# Patient Record
Sex: Female | Born: 1965
Health system: Southern US, Community
[De-identification: ages and names within clinical notes are randomized; demographics above are authoritative.]

## PROBLEM LIST (undated history)

## (undated) DIAGNOSIS — M1990 Unspecified osteoarthritis, unspecified site: Secondary | ICD-10-CM

## (undated) DIAGNOSIS — R51 Headache: Secondary | ICD-10-CM

## (undated) DIAGNOSIS — R200 Anesthesia of skin: Secondary | ICD-10-CM

## (undated) DIAGNOSIS — D099 Carcinoma in situ, unspecified: Secondary | ICD-10-CM

## (undated) DIAGNOSIS — N950 Postmenopausal bleeding: Secondary | ICD-10-CM

## (undated) DIAGNOSIS — A159 Respiratory tuberculosis unspecified: Secondary | ICD-10-CM

## (undated) DIAGNOSIS — R519 Headache, unspecified: Secondary | ICD-10-CM

## (undated) DIAGNOSIS — J302 Other seasonal allergic rhinitis: Secondary | ICD-10-CM

## (undated) DIAGNOSIS — C539 Malignant neoplasm of cervix uteri, unspecified: Secondary | ICD-10-CM

## (undated) DIAGNOSIS — Z923 Personal history of irradiation: Secondary | ICD-10-CM

## (undated) DIAGNOSIS — T7840XA Allergy, unspecified, initial encounter: Secondary | ICD-10-CM

## (undated) DIAGNOSIS — Z9221 Personal history of antineoplastic chemotherapy: Secondary | ICD-10-CM

## (undated) HISTORY — DX: Personal history of irradiation: Z92.3

## (undated) HISTORY — DX: Headache, unspecified: R51.9

## (undated) HISTORY — DX: Unspecified osteoarthritis, unspecified site: M19.90

## (undated) HISTORY — DX: Allergy, unspecified, initial encounter: T78.40XA

## (undated) HISTORY — DX: Personal history of antineoplastic chemotherapy: Z92.21

## (undated) HISTORY — DX: Headache: R51

## (undated) HISTORY — PX: OTHER SURGICAL HISTORY: SHX169

---

## 2013-08-20 ENCOUNTER — Encounter: Payer: Self-pay | Admitting: Nurse Practitioner

## 2013-08-20 ENCOUNTER — Ambulatory Visit (INDEPENDENT_AMBULATORY_CARE_PROVIDER_SITE_OTHER): Payer: PRIVATE HEALTH INSURANCE | Admitting: Nurse Practitioner

## 2013-08-20 VITALS — BP 119/67 | HR 88 | Temp 98.5°F | Resp 18 | Ht 62.75 in | Wt 136.0 lb

## 2013-08-20 DIAGNOSIS — Z111 Encounter for screening for respiratory tuberculosis: Secondary | ICD-10-CM

## 2013-08-20 NOTE — Progress Notes (Signed)
Pre visit review using our clinic review tool, if applicable. No additional management support is needed unless otherwise documented below in the visit note. 

## 2013-08-20 NOTE — Patient Instructions (Addendum)
Please return to have TB test read on Monday, no later than 3 pm. You may inquire of your school as to a vaccination waiver. If they will not waive the requirement, you will need 1 dose to TDAP, 2 doses varicella, and 2 doses MMR. If your insurance will not cover vaccines, they are cheapest at [facility].  Your insurance does not cover labs when you are not sick, so I have not ordered labs. Your menstrual cycles may be getting longer due to approaching menopause. Nice to meet you!  Preventive Care for Adults, Female A healthy lifestyle and preventive care can promote health and wellness. Preventive health guidelines for women include the following key practices.  A routine yearly physical is a good way to check with your health care provider about your health and preventive screening. It is a chance to share any concerns and updates on your health and to receive a thorough exam.  Visit your dentist for a routine exam and preventive care every 6 months. Brush your teeth twice a day and floss once a day. Good oral hygiene prevents tooth decay and gum disease.  The frequency of eye exams is based on your age, health, family medical history, use of contact lenses, and other factors. Follow your health care provider's recommendations for frequency of eye exams.  Eat a healthy diet. Foods like vegetables, fruits, whole grains, low-fat dairy products, and lean protein foods contain the nutrients you need without too many calories. Decrease your intake of foods high in solid fats, added sugars, and salt. Eat the right amount of calories for you.Get information about a proper diet from your health care provider, if necessary.  Regular physical exercise is one of the most important things you can do for your health. Most adults should get at least 150 minutes of moderate-intensity exercise (any activity that increases your heart rate and causes you to sweat) each week. In addition,  most adults need muscle-strengthening exercises on 2 or more days a week.  Maintain a healthy weight. The body mass index (BMI) is a screening tool to identify possible weight problems. It provides an estimate of body fat based on height and weight. Your health care provider can find your BMI, and can help you achieve or maintain a healthy weight.For adults 20 years and older:  A BMI below 18.5 is considered underweight.  A BMI of 18.5 to 24.9 is normal.  A BMI of 25 to 29.9 is considered overweight.  A BMI of 30 and above is considered obese.  Maintain normal blood lipids and cholesterol levels by exercising and minimizing your intake of saturated fat. Eat a balanced diet with plenty of fruit and vegetables. Blood tests for lipids and cholesterol should begin at age 39 and be repeated every 5 years. If your lipid or cholesterol levels are high, you are over 50, or you are at high risk for heart disease, you may need your cholesterol levels checked more frequently.Ongoing high lipid and cholesterol levels should be treated with medicines if diet and exercise are not working.  If you smoke, find out from your health care provider how to quit. If you do not use tobacco, do not start.  Lung cancer screening is recommended for adults aged 36 80 years who are at high risk for developing lung cancer because of a history of smoking. A yearly low-dose CT scan of the lungs is recommended for people who have at least a 30-pack-year history of smoking and  are a current smoker or have quit within the past 15 years. A pack year of smoking is smoking an average of 1 pack of cigarettes a day for 1 year (for example: 1 pack a day for 30 years or 2 packs a day for 15 years). Yearly screening should continue until the smoker has stopped smoking for at least 15 years. Yearly screening should be stopped for people who develop a health problem that would prevent them from having lung cancer treatment.  If you are  pregnant, do not drink alcohol. If you are breastfeeding, be very cautious about drinking alcohol. If you are not pregnant and choose to drink alcohol, do not have more than 1 drink per day. One drink is considered to be 12 ounces (355 mL) of beer, 5 ounces (148 mL) of wine, or 1.5 ounces (44 mL) of liquor.  Avoid use of street drugs. Do not share needles with anyone. Ask for help if you need support or instructions about stopping the use of drugs.  High blood pressure causes heart disease and increases the risk of stroke. Your blood pressure should be checked at least every 1 to 2 years. Ongoing high blood pressure should be treated with medicines if weight loss and exercise do not work.  If you are 10 47 years old, ask your health care provider if you should take aspirin to prevent strokes.  Diabetes screening involves taking a blood sample to check your fasting blood sugar level. This should be done once every 3 years, after age 49, if you are within normal weight and without risk factors for diabetes. Testing should be considered at a younger age or be carried out more frequently if you are overweight and have at least 1 risk factor for diabetes.  Breast cancer screening is essential preventive care for women. You should practice "breast self-awareness." This means understanding the normal appearance and feel of your breasts and may include breast self-examination. Any changes detected, no matter how small, should be reported to a health care provider. Women in their 110s and 30s should have a clinical breast exam (CBE) by a health care provider as part of a regular health exam every 1 to 3 years. After age 55, women should have a CBE every year. Starting at age 27, women should consider having a mammogram (breast X-ray test) every year. Women who have a family history of breast cancer should talk to their health care provider about genetic screening. Women at a high risk of breast cancer should talk to  their health care providers about having an MRI and a mammogram every year.  Breast cancer gene (BRCA)-related cancer risk assessment is recommended for women who have family members with BRCA-related cancers. BRCA-related cancers include breast, ovarian, tubal, and peritoneal cancers. Having family members with these cancers may be associated with an increased risk for harmful changes (mutations) in the breast cancer genes BRCA1 and BRCA2. Results of the assessment will determine the need for genetic counseling and BRCA1 and BRCA2 testing.  The Pap test is a screening test for cervical cancer. A Pap test can show cell changes on the cervix that might become cervical cancer if left untreated. A Pap test is a procedure in which cells are obtained and examined from the lower end of the uterus (cervix).  Women should have a Pap test starting at age 21.  Between ages 43 and 28, Pap tests should be repeated every 2 years.  Beginning at age 29, you should  have a Pap test every 3 years as long as the past 3 Pap tests have been normal.  Some women have medical problems that increase the chance of getting cervical cancer. Talk to your health care provider about these problems. It is especially important to talk to your health care provider if a new problem develops soon after your last Pap test. In these cases, your health care provider may recommend more frequent screening and Pap tests.  The above recommendations are the same for women who have or have not gotten the vaccine for human papillomavirus (HPV).  If you had a hysterectomy for a problem that was not cancer or a condition that could lead to cancer, then you no longer need Pap tests. Even if you no longer need a Pap test, a regular exam is a good idea to make sure no other problems are starting.  If you are between ages 63 and 24 years, and you have had normal Pap tests going back 10 years, you no longer need Pap tests. Even if you no longer need  a Pap test, a regular exam is a good idea to make sure no other problems are starting.  If you have had past treatment for cervical cancer or a condition that could lead to cancer, you need Pap tests and screening for cancer for at least 20 years after your treatment.  If Pap tests have been discontinued, risk factors (such as a new sexual partner) need to be reassessed to determine if screening should be resumed.  The HPV test is an additional test that may be used for cervical cancer screening. The HPV test looks for the virus that can cause the cell changes on the cervix. The cells collected during the Pap test can be tested for HPV. The HPV test could be used to screen women aged 26 years and older, and should be used in women of any age who have unclear Pap test results. After the age of 68, women should have HPV testing at the same frequency as a Pap test.  Colorectal cancer can be detected and often prevented. Most routine colorectal cancer screening begins at the age of 30 years and continues through age 95 years. However, your health care provider may recommend screening at an earlier age if you have risk factors for colon cancer. On a yearly basis, your health care provider may provide home test kits to check for hidden blood in the stool. Use of a small camera at the end of a tube, to directly examine the colon (sigmoidoscopy or colonoscopy), can detect the earliest forms of colorectal cancer. Talk to your health care provider about this at age 50, when routine screening begins. Direct exam of the colon should be repeated every 5 10 years through age 6 years, unless early forms of pre-cancerous polyps or small growths are found.  People who are at an increased risk for hepatitis B should be screened for this virus. You are considered at high risk for hepatitis B if:  You were born in a country where hepatitis B occurs often. Talk with your health care provider about which countries are  considered high risk.  Your parents were born in a high-risk country and you have not received a shot to protect against hepatitis B (hepatitis B vaccine).  You have HIV or AIDS.  You use needles to inject street drugs.  You live with, or have sex with, someone who has Hepatitis B.  You get hemodialysis treatment.  You take certain medicines for conditions like cancer, organ transplantation, and autoimmune conditions.  Hepatitis C blood testing is recommended for all people born from [birth year] through 1965 and any individual with known risks for hepatitis C.  Practice safe sex. Use condoms and avoid high-risk sexual practices to reduce the spread of sexually transmitted infections (STIs). STIs include gonorrhea, chlamydia, syphilis, trichomonas, herpes, HPV, and human immunodeficiency virus (HIV). Herpes, HIV, and HPV are viral illnesses that have no cure. They can result in disability, cancer, and death. Sexually active women aged 21 years and younger should be checked for chlamydia. Older women with new or multiple partners should also be tested for chlamydia. Testing for other STIs is recommended if you are sexually active and at increased risk.  Osteoporosis is a disease in which the bones lose minerals and strength with aging. This can result in serious bone fractures or breaks. The risk of osteoporosis can be identified using a bone density scan. Women ages 70 years and over and women at risk for fractures or osteoporosis should discuss screening with their health care providers. Ask your health care provider whether you should take a calcium supplement or vitamin D to reduce the rate of osteoporosis.  Menopause can be associated with physical symptoms and risks. Hormone replacement therapy is available to decrease symptoms and risks. You should talk to your health care provider about whether hormone replacement therapy is right for you.  Use sunscreen. Apply sunscreen liberally and repeatedly  throughout the day. You should seek shade when your shadow is shorter than you. Protect yourself by wearing long sleeves, pants, a wide-brimmed hat, and sunglasses year round, whenever you are outdoors.  Once a month, do a whole body skin exam, using a mirror to look at the skin on your back. Tell your health care provider of new moles, moles that have irregular borders, moles that are larger than a pencil eraser, or moles that have changed in shape or color.  Stay current with required vaccines (immunizations).  Influenza vaccine. All adults should be immunized every year.  Tetanus, diphtheria, and acellular pertussis (Td, Tdap) vaccine. Pregnant women should receive 1 dose of Tdap vaccine during each pregnancy. The dose should be obtained regardless of the length of time since the last dose. Immunization is preferred during the 27th 36th week of gestation. An adult who has not previously received Tdap or who does not know her vaccine status should receive 1 dose of Tdap. This initial dose should be followed by tetanus and diphtheria toxoids (Td) booster doses every 10 years. Adults with an unknown or incomplete history of completing a 3-dose immunization series with Td-containing vaccines should begin or complete a primary immunization series including a Tdap dose. Adults should receive a Td booster every 10 years.  Varicella vaccine. An adult without evidence of immunity to varicella should receive 2 doses or a second dose if she has previously received 1 dose. Pregnant females who do not have evidence of immunity should receive the first dose after pregnancy. This first dose should be obtained before leaving the health care facility. The second dose should be obtained 4 8 weeks after the first dose.  Human papillomavirus (HPV) vaccine. Females aged 79 26 years who have not received the vaccine previously should obtain the 3-dose series. The vaccine is not recommended for use in pregnant females.  However, pregnancy testing is not needed before receiving a dose. If a female is found to be pregnant after receiving a dose, no  treatment is needed. In that case, the remaining doses should be delayed until after the pregnancy. Immunization is recommended for any person with an immunocompromised condition through the age of 85 years if she did not get any or all doses earlier. During the 3-dose series, the second dose should be obtained 4 8 weeks after the first dose. The third dose should be obtained 24 weeks after the first dose and 16 weeks after the second dose.  Zoster vaccine. One dose is recommended for adults aged 54 years or older unless certain conditions are present.  Measles, mumps, and rubella (MMR) vaccine. Adults born before [birth year] generally are considered immune to measles and mumps. Adults born in [birth year] or later should have 1 or more doses of MMR vaccine unless there is a contraindication to the vaccine or there is laboratory evidence of immunity to each of the three diseases. A routine second dose of MMR vaccine should be obtained at least 28 days after the first dose for students attending postsecondary schools, health care workers, or international travelers. People who received inactivated measles vaccine or an unknown type of measles vaccine during 1963 1967 should receive 2 doses of MMR vaccine. People who received inactivated mumps vaccine or an unknown type of mumps vaccine before 1979 and are at high risk for mumps infection should consider immunization with 2 doses of MMR vaccine. For females of childbearing age, rubella immunity should be determined. If there is no evidence of immunity, females who are not pregnant should be vaccinated. If there is no evidence of immunity, females who are pregnant should delay immunization until after pregnancy. Unvaccinated health care workers born before [birth year] who lack laboratory evidence of measles, mumps, or rubella immunity or laboratory  confirmation of disease should consider measles and mumps immunization with 2 doses of MMR vaccine or rubella immunization with 1 dose of MMR vaccine.  Pneumococcal 13-valent conjugate (PCV13) vaccine. When indicated, a person who is uncertain of her immunization history and has no record of immunization should receive the PCV13 vaccine. An adult aged 33 years or older who has certain medical conditions and has not been previously immunized should receive 1 dose of PCV13 vaccine. This PCV13 should be followed with a dose of pneumococcal polysaccharide (PPSV23) vaccine. The PPSV23 vaccine dose should be obtained at least 8 weeks after the dose of PCV13 vaccine. An adult aged 67 years or older who has certain medical conditions and previously received 1 or more doses of PPSV23 vaccine should receive 1 dose of PCV13. The PCV13 vaccine dose should be obtained 1 or more years after the last PPSV23 vaccine dose.  Pneumococcal polysaccharide (PPSV23) vaccine. When PCV13 is also indicated, PCV13 should be obtained first. All adults aged 65 years and older should be immunized. An adult younger than age 45 years who has certain medical conditions should be immunized. Any person who resides in a nursing home or long-term care facility should be immunized. An adult smoker should be immunized. People with an immunocompromised condition and certain other conditions should receive both PCV13 and PPSV23 vaccines. People with human immunodeficiency virus (HIV) infection should be immunized as soon as possible after diagnosis. Immunization during chemotherapy or radiation therapy should be avoided. Routine use of PPSV23 vaccine is not recommended for American Indians, [place] Natives, or people younger than 65 years unless there are medical conditions that require PPSV23 vaccine. When indicated, people who have unknown immunization and have no record of immunization should receive PPSV23 vaccine. One-time revaccination 5  years  after the first dose of PPSV23 is recommended for people aged 54 64 years who have chronic kidney failure, nephrotic syndrome, asplenia, or immunocompromised conditions. People who received 1 2 doses of PPSV23 before age 70 years should receive another dose of PPSV23 vaccine at age 32 years or later if at least 5 years have passed since the previous dose. Doses of PPSV23 are not needed for people immunized with PPSV23 at or after age 94 years.  Meningococcal vaccine. Adults with asplenia or persistent complement component deficiencies should receive 2 doses of quadrivalent meningococcal conjugate (MenACWY-D) vaccine. The doses should be obtained at least 2 months apart. Microbiologists working with certain meningococcal bacteria, [place] recruits, people at risk during an outbreak, and people who travel to or live in countries with a high rate of meningitis should be immunized. A first-year college student up through age 55 years who is living in a residence hall should receive a dose if she did not receive a dose on or after her 16th birthday. Adults who have certain high-risk conditions should receive one or more doses of vaccine.  Hepatitis A vaccine. Adults who wish to be protected from this disease, have certain high-risk conditions, work with hepatitis A-infected animals, work in hepatitis A research labs, or travel to or work in countries with a high rate of hepatitis A should be immunized. Adults who were previously unvaccinated and who anticipate close contact with an international adoptee during the first 60 days after arrival in the [place] from a country with a high rate of hepatitis A should be immunized.  Hepatitis B vaccine. Adults who wish to be protected from this disease, have certain high-risk conditions, may be exposed to blood or other infectious body fluids, are household contacts or sex partners of hepatitis B positive people, are clients or workers in certain care facilities, or  travel to or work in countries with a high rate of hepatitis B should be immunized.  Haemophilus influenzae type b (Hib) vaccine. A previously unvaccinated person with asplenia or sickle cell disease or having a scheduled splenectomy should receive 1 dose of Hib vaccine. Regardless of previous immunization, a recipient of a hematopoietic stem cell transplant should receive a 3-dose series 6 12 months after her successful transplant. Hib vaccine is not recommended for adults with HIV infection. Preventive Services / Frequency Ages 77 to 64years  Blood pressure check.** / Every 1 to 2 years.  Lipid and cholesterol check.** / Every 5 years beginning at age 82 years.  Lung cancer screening. / Every year if you are aged 4 80 years and have a 30-pack-year history of smoking and currently smoke or have quit within the past 15 years. Yearly screening is stopped once you have quit smoking for at least 15 years or develop a health problem that would prevent you from having lung cancer treatment.  Clinical breast exam.** / Every year after age 13 years.  BRCA-related cancer risk assessment.** / For women who have family members with a BRCA-related cancer (breast, ovarian, tubal, or peritoneal cancers).  Mammogram.** / Every year beginning at age 43 years and continuing for as long as you are in good health. Consult with your health care provider.  Pap test.** / Every 3 years starting at age 39 years through age 61 or 88 years with a history of 3 consecutive normal Pap tests.  HPV screening.** / Every 3 years from ages 81 years through ages 34 to 12 years with a history of  3 consecutive normal Pap tests.  Fecal occult blood test (FOBT) of stool. / Every year beginning at age 49 years and continuing until age 70 years. You may not need to do this test if you get a colonoscopy every 10 years.  Flexible sigmoidoscopy or colonoscopy.** / Every 5 years for a flexible sigmoidoscopy or every 10 years for a  colonoscopy beginning at age 78 years and continuing until age 37 years.  Hepatitis C blood test.** / For all people born from [birth year] through 1965 and any individual with known risks for hepatitis C.  Skin self-exam. / Monthly.  Influenza vaccine. / Every year.  Tetanus, diphtheria, and acellular pertussis (Tdap/Td) vaccine.** / Consult your health care provider. Pregnant women should receive 1 dose of Tdap vaccine during each pregnancy. 1 dose of Td every 10 years.  Varicella vaccine.** / Consult your health care provider. Pregnant females who do not have evidence of immunity should receive the first dose after pregnancy.  Zoster vaccine.** / 1 dose for adults aged 17 years or older.  Measles, mumps, rubella (MMR) vaccine.** / You need at least 1 dose of MMR if you were born in 1957 or later. You may also need a 2nd dose. For females of childbearing age, rubella immunity should be determined. If there is no evidence of immunity, females who are not pregnant should be vaccinated. If there is no evidence of immunity, females who are pregnant should delay immunization until after pregnancy.  Pneumococcal 13-valent conjugate (PCV13) vaccine.** / Consult your health care provider.  Pneumococcal polysaccharide (PPSV23) vaccine.** / 1 to 2 doses if you smoke cigarettes or if you have certain conditions.  Meningococcal vaccine.** / Consult your health care provider.  Hepatitis A vaccine.** / Consult your health care provider.  Hepatitis B vaccine.** / Consult your health care provider.  Haemophilus influenzae type b (Hib) vaccine.** / Consult your health care provider. ** Family history and personal history of risk and conditions may change your health care provider's recommendations. Document Released: 06/25/2001 Document Revised: 02/17/2013 Document Reviewed: 09/24/2010 [facility] Patient Information 2014 [place], [place].  Perimenopause Perimenopause is the time when your body begins to move  into the menopause (no menstrual period for 12 straight months). It is a natural process. Perimenopause can begin 2 8 years before the menopause and usually lasts for 1 year after the menopause. During this time, your ovaries may or may not produce an egg. The ovaries vary in their production of estrogen and progesterone hormones each month. This can cause irregular menstrual periods, difficulty getting pregnant, vaginal bleeding between periods, and uncomfortable symptoms. CAUSES  Irregular production of the ovarian hormones, estrogen and progesterone, and not ovulating every month.  Other causes include:  Tumor of the pituitary gland in the brain.  Medical disease that affects the ovaries.  Radiation treatment.  Chemotherapy.  Unknown causes.  Heavy smoking and excessive alcohol intake can bring on perimenopause sooner. SIGNS AND SYMPTOMS   Hot flashes.  Night sweats.  Irregular menstrual periods.  Decreased sex drive.  Vaginal dryness.  Headaches.  Mood swings.  Depression.  Memory problems.  Irritability.  Tiredness.  Weight gain.  Trouble getting pregnant.  The beginning of losing bone cells (osteoporosis).  The beginning of hardening of the arteries (atherosclerosis). DIAGNOSIS  Your health care provider will make a diagnosis by analyzing your age, menstrual history, and symptoms. He or she will do a physical exam and note any changes in your body, especially your female organs. Female hormone tests  may or may not be helpful depending on the amount of female hormones you produce and when you produce them. However, other hormone tests may be helpful to rule out other problems. TREATMENT  In some cases, no treatment is needed. The decision on whether treatment is necessary during the perimenopause should be made by you and your health care provider based on how the symptoms are affecting you and your lifestyle. Various treatments are available, such  as:  Treating individual symptoms with a specific medicine for that symptom.  Herbal medicines that can help specific symptoms.  Counseling.  Group therapy. HOME CARE INSTRUCTIONS   Keep track of your menstrual periods (when they occur, how heavy they are, how long between periods, and how long they last) as well as your symptoms and when they started.  Only take over-the-counter or prescription medicines as directed by your health care provider.  Sleep and rest.  Exercise.  Eat a diet that contains calcium (good for your bones) and soy (acts like the estrogen hormone).  Do not smoke.  Avoid alcoholic beverages.  Take vitamin supplements as recommended by your health care provider. Taking vitamin E may help in certain cases.  Take calcium and vitamin D supplements to help prevent bone loss.  Group therapy is sometimes helpful.  Acupuncture may help in some cases. SEEK MEDICAL CARE IF:   You have questions about any symptoms you are having.  You need a referral to a specialist (gynecologist, psychiatrist, or psychologist). SEEK IMMEDIATE MEDICAL CARE IF:   You have vaginal bleeding.  Your period lasts longer than 8 days.  Your periods are recurring sooner than 21 days.  You have bleeding after intercourse.  You have severe depression.  You have pain when you urinate.  You have severe headaches.  You have vision problems. Document Released: 06/06/2004 Document Revised: 02/17/2013 Document Reviewed: 11/26/2012 [facility] Patient Information 2014 [place], [place].  Tuberculin Skin Test The PPD skin test is a method used to help with the diagnosis of a disease called tuberculosis (TB). HOW THE TEST IS DONE  The test site (usually the forearm) is cleansed. The PPD extract is then injected under the top layer of skin, causing a blister to form on the skin. The reaction will take 48 - 72 hours to develop. You must return to your health care provider within that time  to have the area checked. This will determine whether you have had a significant reaction to the PPD test. A reaction is measured in millimeters of hard swelling (induration) at the site. PREPARATION FOR TEST  There is no special preparation for this test. People with a skin rash or other skin irritations on their arms may need to have the test performed at a different spot on the body. Tell your health care provider if you have ever had a positive PPD skin test. If so, you should not have a repeat PPD test. Tell your doctor if you have a medical condition or if you take certain drugs, such as steroids, that can affect your immune system. These situations may lead to inaccurate test results. NORMAL FINDINGS A negative reaction (no induration) or a level of hard swelling that falls below a certain cutoff may mean that a person has not been infected with the bacteria that cause TB. There are different cutoffs for children, people with HIV, and other risk groups. Unfortunately, this is not a perfect test, and up to 20% of people infected with tuberculosis may not have a  reaction on the PPD skin test. In addition, certain conditions that affect the immune system (cancer, recent chemotherapy, late-stage AIDS) may cause a false-negative test result.  The reaction will take 48 - 72 hours to develop. You must return to your health care provider within that time to have the area checked. Follow your caregiver's instructions as to where and when to report for this to be done. Ranges for normal findings may vary among different laboratories and hospitals. You should always check with your doctor after having lab work or other tests done to discuss the meaning of your test results and whether your values are considered within normal limits. WHAT ABNORMAL RESULTS MEAN  The results of the test depend on the size of the skin reaction and on the person being tested.  A small reaction (5 mm of hard swelling at the site)  is considered to be positive in people who have HIV, who are taking steroid therapy, or who have been in close contact with a person who has active tuberculosis. Larger reactions (greater than or equal to 10 mm) are considered positive in people with diabetes or kidney failure, and in health care workers, among others. In people with no known risks for tuberculosis, a positive reaction requires 15 mm or more of hard swelling at the site. RISKS AND COMPLICATIONS There is a very small risk of severe redness and swelling of the arm in people who have had a previous positive PPD test and who have the test again. There also have been a few rare cases of this reaction in people who have not been tested before. CONSIDERATIONS  A positive skin test does not necessarily mean that a person has active tuberculosis. More tests will be done to check whether active disease is present. Many people who were born outside the [place] may have had a vaccine called "BCG," which can lead to a false-positive test result. MEANING OF TEST  Your caregiver will go over the test results with you and discuss the importance and meaning of your results, as well as treatment options and the need for additional tests if necessary. OBTAINING THE TEST RESULTS It is your responsibility to obtain your test results. Ask the lab or department performing the test when and how you will get your results. Document Released: 02/06/2005 Document Revised: 07/22/2011 Document Reviewed: 04/10/2008 [facility] Patient Information 2014 [place], [place].

## 2013-08-23 ENCOUNTER — Ambulatory Visit: Payer: PRIVATE HEALTH INSURANCE | Admitting: *Deleted

## 2013-08-23 LAB — TB SKIN TEST
INDURATION: 0.7 mm
TB SKIN TEST: NEGATIVE

## 2013-08-24 ENCOUNTER — Encounter: Payer: Self-pay | Admitting: Nurse Practitioner

## 2013-08-24 NOTE — Progress Notes (Signed)
Subjective:     Holly Pena is a 48 y.o. female and is here for a college physical. The patient reports no problems.  History   Social History  . Marital Status: Married    Spouse Name: N/A    Number of Children: 2  . Years of Education: N/A   Occupational History  . student     [employer]   Social History Main Topics  . Smoking status: Never Smoker   . Smokeless tobacco: Not on file  . Alcohol Use: No  . Drug Use: Not on file  . Sexual Activity: Not on file   Other Topics Concern  . Not on file   Social History Narrative   Holly Pena is From [place]. She has been in [place] for 1 year. She lives with her husband & 2 daughters. She is a Electronics engineer.   There are no preventive care reminders to display for this patient.  The following portions of the patient's history were reviewed and updated as appropriate: allergies, current medications, past family history, past medical history, past social history, past surgical history and problem list.  Review of Systems Constitutional: negative for fatigue, fevers and night sweats Eyes: negative, uses readers Ears, nose, mouth, throat, and face: negative for nasal congestion and sore throat Respiratory: negative for asthma, cough and sputum Cardiovascular: negative for chest pressure/discomfort, fatigue, irregular heart beat and lower extremity edema Gastrointestinal: negative for abdominal pain, constipation and diarrhea Genitourinary:positive for [facility] getting longer, negative for dysuria Integument/breast: negative for rash Musculoskeletal:negative for arthralgias, back pain and myalgias Behavioral/Psych: negative for decreased appetite, depression, excessive alcohol consumption, illegal drug usage, sleep disturbance and tobacco use   Objective:    BP 119/67  Pulse 88  Temp(Src) 98.5 F (36.9 C) (Temporal)  Resp 18  Ht 5' 2.75" (1.594 m)  Wt 136 lb (61.689 kg)  BMI 24.28 kg/m2  SpO2 98%  LMP 07/15/2013 General appearance: alert,  cooperative, appears stated age and no distress Head: Normocephalic, without obvious abnormality, atraumatic Eyes: negative findings: lids and lashes normal and conjunctivae and sclerae normal Ears: normal TM's and external ear canals both ears Throat: lips, mucosa, and tongue normal; teeth and gums normal Lungs: clear to auscultation bilaterally Heart: regular rate and rhythm, S1, S2 normal, no murmur, click, rub or gallop Abdomen: soft, non-tender; bowel sounds normal; no masses,  no organomegaly Extremities: extremities normal, atraumatic, no cyanosis or edema Pulses: 2+ and symmetric Skin: Skin color, texture, turgor normal. No rashes or lesions Lymph nodes: Cervical, supraclavicular, and axillary nodes normal.  Neuro: grossly intact  Assessment:    Healthy female exam.  No screening labs as insurance does not cover preventive services No vaccine record to review-if unable to locate-needs 2 varicella, Tdap, 2 MMR Request PPD skin test today.    Plan:    Administered PPD. F/u in 72 hrs to read PPD. No form filled out, as pt is requesting vaccine waiver from school. She will bring form back at her convenience. See After Visit Summary for Counseling Recommendations

## 2013-09-03 ENCOUNTER — Ambulatory Visit: Payer: PRIVATE HEALTH INSURANCE

## 2013-09-03 ENCOUNTER — Encounter: Payer: Self-pay | Admitting: Nurse Practitioner

## 2013-09-03 VITALS — BP 106/67 | HR 79 | Temp 98.2°F | Resp 18 | Ht 62.75 in | Wt 136.0 lb

## 2013-09-03 DIAGNOSIS — Z23 Encounter for immunization: Secondary | ICD-10-CM

## 2013-09-03 NOTE — Progress Notes (Unsigned)
Pre visit review using our clinic review tool, if applicable. No additional management support is needed unless otherwise documented below in the visit note. 

## 2013-12-13 ENCOUNTER — Encounter: Payer: Self-pay | Admitting: Nurse Practitioner

## 2013-12-13 ENCOUNTER — Other Ambulatory Visit: Payer: Self-pay | Admitting: Nurse Practitioner

## 2013-12-13 ENCOUNTER — Ambulatory Visit (INDEPENDENT_AMBULATORY_CARE_PROVIDER_SITE_OTHER): Payer: PRIVATE HEALTH INSURANCE | Admitting: Nurse Practitioner

## 2013-12-13 VITALS — BP 88/58 | HR 70 | Temp 98.6°F | Resp 18 | Ht 62.75 in | Wt 132.0 lb

## 2013-12-13 DIAGNOSIS — R1900 Intra-abdominal and pelvic swelling, mass and lump, unspecified site: Secondary | ICD-10-CM

## 2013-12-13 LAB — POCT URINE PREGNANCY: Preg Test, Ur: NEGATIVE

## 2013-12-13 NOTE — Progress Notes (Signed)
Pre visit review using our clinic review tool, if applicable. No additional management support is needed unless otherwise documented below in the visit note. 

## 2013-12-13 NOTE — Progress Notes (Signed)
Subjective:     Holly Pena is a 48 y.o. female who presents for evaluation of abdominal pain. Onset was 1 day ago. She noticed tender area RLQ while taking shower last night. She feels something firm in abdomen. Last MC was 4 weeks ago. She is not using birth control. She is sexually active. She feels her abdomen has gotten bigger in last few days. Associated symptoms: heart burn in last few days that has improved since returning from trip & eating "regular" foods again. The patient denies belching, chills, constipation, diarrhea, dysuria, fever, flatus, nausea and vomiting.  The patient's history has been marked as reviewed and updated as appropriate.  Review of Systems Pertinent items are noted in HPI.     Objective:    BP 88/58  Pulse 70  Temp(Src) 98.6 F (37 C) (Temporal)  Resp 18  Ht 5' 2.75" (1.594 m)  Wt 132 lb (59.875 kg)  BMI 23.57 kg/m2  SpO2 98% General appearance: alert, cooperative, appears stated age and no distress Head: Normocephalic, without obvious abnormality, atraumatic Eyes: negative findings: lids and lashes normal and conjunctivae and sclerae normal Abdomen: abnormal findings:  Linear mass felt RLQ/suprapubic area that can be felt across pelvis to mid-line. tender to push. About 1cm X 4cm. O/w nml abd exam. No HSM.   Extremities: extremities normal, atraumatic, no cyanosis or edema    Assessment:   1. Pelvic mass in female DD: uterine fibroid, extrauterine growth - US Pelvis Complete; Future - POCT urine pregnancy-neg.  F/u 10 days. See pt instructions.

## 2013-12-13 NOTE — Patient Instructions (Addendum)
Please get ultrasound of pelvis. Mass may be uterine fibroids, sometimes they are on the outside of the uterus. Fibroids get bigger just before your menstrual cycle.  Please schedule office visit in 10 days to re-evaluate mass. If it is fibroid, it may be smaller. We will also discuss ultrasound results at that time.  If you have pain, take 400 mg ibuprophen with food every 12 hours.

## 2013-12-15 ENCOUNTER — Ambulatory Visit (HOSPITAL_BASED_OUTPATIENT_CLINIC_OR_DEPARTMENT_OTHER)
Admission: RE | Admit: 2013-12-15 | Discharge: 2013-12-15 | Disposition: A | Discharge status: Home / Self Care | Payer: No Typology Code available for payment source | Source: Ambulatory Visit | Attending: Nurse Practitioner | Admitting: Nurse Practitioner

## 2013-12-15 DIAGNOSIS — R1900 Intra-abdominal and pelvic swelling, mass and lump, unspecified site: Secondary | ICD-10-CM | POA: Diagnosis present

## 2013-12-22 ENCOUNTER — Telehealth: Payer: Self-pay | Admitting: Nurse Practitioner

## 2013-12-22 NOTE — Telephone Encounter (Signed)
Discussed [place] result w/pt. Pain resolved within day or so of appt. She had c-sections w/children.-possible scar tissue palpated. F/u PRN

## 2016-06-26 ENCOUNTER — Ambulatory Visit (INDEPENDENT_AMBULATORY_CARE_PROVIDER_SITE_OTHER): Payer: 59 | Admitting: Family Medicine

## 2016-06-26 ENCOUNTER — Encounter: Payer: Self-pay | Admitting: Family Medicine

## 2016-06-26 VITALS — BP 120/79 | HR 50 | Temp 97.7°F | Resp 18 | Ht 63.0 in | Wt 137.0 lb

## 2016-06-26 DIAGNOSIS — R519 Headache, unspecified: Secondary | ICD-10-CM | POA: Insufficient documentation

## 2016-06-26 DIAGNOSIS — R51 Headache: Secondary | ICD-10-CM

## 2016-06-26 DIAGNOSIS — J0100 Acute maxillary sinusitis, unspecified: Secondary | ICD-10-CM | POA: Diagnosis not present

## 2016-06-26 MED ORDER — DOXYCYCLINE HYCLATE 100 MG PO TABS: 100.0000 mg | ORAL_TABLET | Freq: Two times a day (BID) | ORAL | 0 refills | Status: DC

## 2016-06-26 NOTE — Progress Notes (Signed)
    Holly Pena , [birth year], 51 y.o., female MRN: [id number] Patient Care Team    Relationship Specialty Notifications Start End  [place], DO PCP - General Family Medicine  06/26/16    Pt is present with interpreter today CC: headaches Subjective: Pt presents for an OV with complaints of headache of 1 week  duration. Pt has had a h/o frequent headaches by PMH, she feels this headache is different. She states last week she experienced a headache around her eyes, with ear pressure, mild sore throat and teeth pain. She states her husband was ill last week, but had different URI sx. She denies fever, chills, nausea, vomit, diarrhea, rhinorrhea.   No Known Allergies Social History  Substance Use Topics  . Smoking status: Never Smoker  . Smokeless tobacco: Never Used  . Alcohol use No   Past Medical History:  Diagnosis Date  . Frequent headaches    Past Surgical History:  Procedure Laterality Date  . CESAREAN SECTION     History reviewed. No pertinent family history. Allergies as of 06/26/2016   No Known Allergies     Medication List    as of 06/26/2016 10:22 AM   You have not been prescribed any medications.     No results found for this or any previous visit (from the past 24 hour(s)). No results found.   ROS: Negative, with the exception of above mentioned in HPI   Objective:  BP 120/79 (BP Location: Left Arm, Patient Position: Sitting, Cuff Size: Normal)   Pulse (!) 50   Temp 97.7 F (36.5 C)   Resp 18   Ht 5\' 3"  (1.6 m)   Wt 137 lb (62.1 kg)   SpO2 100%   BMI 24.27 kg/m  Body mass index is 24.27 kg/m. Gen: Afebrile. No acute distress. Nontoxic in appearance, well developed, well nourished. [place] female. Speaks [place] well.  HENT: AT. [place]. Bilateral TM visualized bilateral fullness. MMM, no oral lesions. Bilateral nares with erythema, drainage, swelling. Throat without erythema or exudates. No cough present. TTP max sinus.  Eyes:Pupils Equal Round Reactive  to light, Extraocular movements intact,  Conjunctiva without redness, discharge or icterus. Neck/lymp/endocrine: Supple,mild ant cervical lymphadenopathy CV: RRR Chest: CTAB, no wheeze or crackles. Good air movement, normal resp effort.  Abd: Soft. NTND. BS present.  Neuro:  Normal gait. PERLA. EOMi. Alert. Oriented x3   Assessment/Plan: Holly Pena is a 51 y.o. female present for OV for  Frequent headaches/Acute maxillary sinusitis, recurrence not specified - pt with classic sinus infection symptoms and exam.  - rest, hydrate.  - +/_ mucinex, flonase - Doxycyline BID x 10d - F/U PRN   electronically signed by:  Howard Pouch, DO  [place]

## 2016-06-26 NOTE — Patient Instructions (Signed)
-   Flonase nasal spray and mucinex plain for drainage (throat) - advil/tylenol for headache if needed.  - Doxycyline every 12 hours for 10 days for sinus infection.    Sinusitis, Adult Sinusitis is soreness and inflammation of your sinuses. Sinuses are hollow spaces in the bones around your face. They are located:  Around your eyes.  In the middle of your forehead.  Behind your nose.  In your cheekbones. Your sinuses and nasal passages are lined with a stringy fluid (mucus). Mucus normally drains out of your sinuses. When your nasal tissues get inflamed or swollen, the mucus can get trapped or blocked so air cannot flow through your sinuses. This lets bacteria, viruses, and funguses grow, and that leads to infection. Follow these instructions at home: Medicines  Take, use, or apply over-the-counter and prescription medicines only as told by your doctor. These may include nasal sprays.  If you were prescribed an antibiotic medicine, take it as told by your doctor. Do not stop taking the antibiotic even if you start to feel better. Hydrate and Humidify  Drink enough water to keep your pee (urine) clear or pale yellow.  Use a cool mist humidifier to keep the humidity level in your home above 50%.  Breathe in steam for 10-15 minutes, 3-4 times a day or as told by your doctor. You can do this in the bathroom while a hot shower is running.  Try not to spend time in cool or dry air. Rest  Rest as much as possible.  Sleep with your head raised (elevated).  Make sure to get enough sleep each night. General instructions  Put a warm, moist washcloth on your face 3-4 times a day or as told by your doctor. This will help with discomfort.  Wash your hands often with soap and water. If there is no soap and water, use hand sanitizer.  Do not smoke. Avoid being around people who are smoking (secondhand smoke).  Keep all follow-up visits as told by your doctor. This is important. Contact  a doctor if:  You have a fever.  Your symptoms get worse.  Your symptoms do not get better within 10 days. Get help right away if:  You have a very bad headache.  You cannot stop throwing up (vomiting).  You have pain or swelling around your face or eyes.  You have trouble seeing.  You feel confused.  Your neck is stiff.  You have trouble breathing. This information is not intended to replace advice given to you by your health care provider. Make sure you discuss any questions you have with your health care provider. Document Released: 10/16/2007 Document Revised: 12/24/2015 Document Reviewed: 02/22/2015 Elsevier Interactive Patient Education  2017 [employer].

## 2016-07-05 ENCOUNTER — Telehealth: Payer: Self-pay | Admitting: Family Medicine

## 2016-07-05 NOTE — Telephone Encounter (Signed)
Patient scheduled an appointment at Saturday [facility]

## 2016-07-05 NOTE — Telephone Encounter (Signed)
Patient states her throat is still sore & head is still hurting. Please advise. She has taken 10 days of doxcycline. Patient request CB. Patient would like to know how long it takes to start feeling better

## 2016-07-06 ENCOUNTER — Ambulatory Visit (INDEPENDENT_AMBULATORY_CARE_PROVIDER_SITE_OTHER): Payer: 59 | Admitting: Internal Medicine

## 2016-07-06 ENCOUNTER — Encounter: Payer: Self-pay | Admitting: Internal Medicine

## 2016-07-06 VITALS — BP 100/64 | HR 66 | Temp 98.5°F | Wt 136.0 lb

## 2016-07-06 DIAGNOSIS — J069 Acute upper respiratory infection, unspecified: Secondary | ICD-10-CM | POA: Diagnosis not present

## 2016-07-06 DIAGNOSIS — B9789 Other viral agents as the cause of diseases classified elsewhere: Secondary | ICD-10-CM | POA: Diagnosis not present

## 2016-07-06 MED ORDER — METHYLPREDNISOLONE ACETATE 40 MG/ML IJ SUSP: 40.0000 mg | Freq: Once | INTRAMUSCULAR | Status: DC

## 2016-07-06 MED ORDER — PREDNISONE 10 MG PO TABS: ORAL_TABLET | ORAL | 0 refills | Status: DC

## 2016-07-06 MED ORDER — METHYLPREDNISOLONE ACETATE 40 MG/ML IJ SUSP
40.0000 mg | Freq: Once | INTRAMUSCULAR | Status: AC
  Administered 2016-07-06: 40 mg via INTRAMUSCULAR

## 2016-07-06 NOTE — Progress Notes (Signed)
Pre visit review using our clinic review tool, if applicable. No additional management support is needed unless otherwise documented below in the visit note. 

## 2016-07-06 NOTE — Progress Notes (Signed)
Subjective:  Patient ID: Holly Pena, female    DOB: 1966/02/15  Age: 51 y.o. MRN: [id number]  CC: The encounter diagnosis was Viral URI.  HPI Holly Pena presents for follow up on facial, neck ear and head pain after empiric  treatment  for sinusitis with doxycycline  X 10 DAYS,  Currently on Day 9 and still feeing pressure in ears. No purulent   drainage,  No fevers,  No ear pain, no posteriori neck pain,  No vision changes,  No nausea,  Diarrhea.      Exam normal   Outpatient Medications Prior to Visit  Medication Sig Dispense Refill  . doxycycline (VIBRA-TABS) 100 MG tablet Take 1 tablet (100 mg total) by mouth 2 (two) times daily. 20 tablet 0   No facility-administered medications prior to visit.     Review of Systems;  Patient deniesunintentional weight loss, skin rash, eye pain, sinus congestion and sinus pain, sore throat, dysphagia,  hemoptysis , cough, dyspnea, wheezing, chest pain, palpitations, orthopnea, edema, abdominal pain,, melena, , constipation, flank pain, dysuria, hematuria, urinary  Frequency, nocturia, numbness, tingling, seizures,  Focal weakness, Loss of consciousness,  Tremor, insomnia, depression, anxiety, and suicidal ideation.      Objective:  BP 100/64   Pulse 66   Temp 98.5 F (36.9 C) (Oral)   Wt 136 lb (61.7 kg)   SpO2 98%   BMI 24.09 kg/m   BP Readings from Last 3 Encounters:  07/06/16 100/64  06/26/16 120/79  12/13/13 (!) 88/58    Wt Readings from Last 3 Encounters:  07/06/16 136 lb (61.7 kg)  06/26/16 137 lb (62.1 kg)  12/13/13 132 lb (59.9 kg)    General appearance: alert, cooperative and appears stated age Ears: normal TM's and external ear canals both ears Throat: lips, mucosa, and tongue normal; teeth and gums normal Neck: no adenopathy, no carotid bruit, supple, symmetrical, trachea midline and thyroid not enlarged, symmetric, no tenderness/mass/nodules Back: symmetric, no curvature. ROM normal. No CVA tenderness. Lungs: clear to  auscultation bilaterally Heart: regular rate and rhythm, S1, S2 normal, no murmur, click, rub or gallop Abdomen: soft, non-tender; bowel sounds normal; no masses,  no organomegaly Pulses: 2+ and symmetric Skin: Skin color, texture, turgor normal. No rashes or lesions Lymph nodes: Cervical, supraclavicular, and axillary nodes normal.  No results found for: HGBA1C  No results found for: CREATININE  No results found for: WBC, HGB, HCT, PLT, GLUCOSE, CHOL, TRIG, HDL, LDLDIRECT, LDLCALC, ALT, AST, NA, K, CL, CREATININE, BUN, CO2, TSH, PSA, INR, GLUF, HGBA1C, MICROALBUR  US Transvaginal Non-ob  Result Date: 12/15/2013 CLINICAL DATA:  Palpable right pelvic mass EXAM: TRANSABDOMINAL AND TRANSVAGINAL ULTRASOUND OF PELVIS TECHNIQUE: Both transabdominal and transvaginal ultrasound examinations of the pelvis were performed. Transabdominal technique was performed for global imaging of the pelvis including uterus, ovaries, adnexal regions, and pelvic cul-de-sac. It was necessary to proceed with endovaginal exam following the transabdominal exam to visualize the endometrium and ovaries. COMPARISON:  None FINDINGS: Uterus Measurements: 7.9 x 5.1 x 4.1 cm. Anteverted, anteflexed. No fibroids or other mass visualized. Endometrium Thickness: 1.1 cm, borderline trilaminar in appearance. No focal abnormality visualized. Right ovary Measurements: 2.1 x 1.7 x 1.5 cm. Normal appearance/no adnexal mass. Left ovary Measurements: 2.3 x 1.8 x 1.5 cm. Normal appearance/no adnexal mass. Other findings No free fluid. IMPRESSION: Normal exam. Electronically Signed   By: Conchita Paris M.D.   On: 12/15/2013 11:34   US Pelvis Complete  Result Date: 12/15/2013 CLINICAL DATA:  Palpable  right pelvic mass EXAM: TRANSABDOMINAL AND TRANSVAGINAL ULTRASOUND OF PELVIS TECHNIQUE: Both transabdominal and transvaginal ultrasound examinations of the pelvis were performed. Transabdominal technique was performed for global imaging of the pelvis  including uterus, ovaries, adnexal regions, and pelvic cul-de-sac. It was necessary to proceed with endovaginal exam following the transabdominal exam to visualize the endometrium and ovaries. COMPARISON:  None FINDINGS: Uterus Measurements: 7.9 x 5.1 x 4.1 cm. Anteverted, anteflexed. No fibroids or other mass visualized. Endometrium Thickness: 1.1 cm, borderline trilaminar in appearance. No focal abnormality visualized. Right ovary Measurements: 2.1 x 1.7 x 1.5 cm. Normal appearance/no adnexal mass. Left ovary Measurements: 2.3 x 1.8 x 1.5 cm. Normal appearance/no adnexal mass. Other findings No free fluid. IMPRESSION: Normal exam. Electronically Signed   By: Conchita Paris M.D.   On: 12/15/2013 11:34    Assessment & Plan:   Problem List Items Addressed This Visit    Viral URI - Primary    She has no signs of bacterial sinusitis, otitis or ongoing infection and and chest is clear.  Likely viral URI,  Will treat inflammation with prednisone taper.  IM depo medrol given as well.if symptoms resolve but return, given her history of recurrent headache , should consider rheumatologic workup      Relevant Medications   methylPREDNISolone acetate (DEPO-MEDROL) injection 40 mg (Completed)      I am having [name] start on predniSONE. I am also having her maintain her doxycycline. We administered methylPREDNISolone acetate.  Meds ordered this encounter  Medications  . predniSONE (DELTASONE) 10 MG tablet    Sig: 6 tablets on Day 1 , then reduce by 1 tablet daily until gone    Dispense:  21 tablet    Refill:  0  . DISCONTD: methylPREDNISolone acetate (DEPO-MEDROL) injection 40 mg  . methylPREDNISolone acetate (DEPO-MEDROL) injection 40 mg    Medications Discontinued During This Encounter  Medication Reason  . methylPREDNISolone acetate (DEPO-MEDROL) injection 40 mg     Follow-up: No Follow-up on file.   Crecencio Mc, MD

## 2016-07-06 NOTE — Patient Instructions (Addendum)
Your sinus infection appears to have resolved,  But you still have inflammation  Finish the doxycycline.  You received a steroid injection today   I am prescribing a 6 day course of prednisone  to take to resolve the inflammation that is making you feel bad  Tae 6 tablets all at once on day 1 (start tomorrow morning ) 5 tablets all at once tomorrow morning 4 tablets the next day 3 tablets the next day 2 tablets the next day 1 tablet the next day DONE!!  FOLLOW UP WITH your regular doctor if no better

## 2016-07-06 NOTE — Assessment & Plan Note (Addendum)
She has no signs of bacterial sinusitis, otitis or ongoing infection and and chest is clear.  Likely viral URI,  Will treat inflammation with prednisone taper.  IM depo medrol given as well.if symptoms resolve but return, given her history of recurrent headache , should consider rheumatologic workup

## 2016-08-08 ENCOUNTER — Ambulatory Visit: Payer: 59 | Admitting: Family Medicine

## 2016-08-13 ENCOUNTER — Ambulatory Visit (INDEPENDENT_AMBULATORY_CARE_PROVIDER_SITE_OTHER): Payer: 59 | Admitting: Family Medicine

## 2016-08-13 ENCOUNTER — Encounter: Payer: Self-pay | Admitting: Family Medicine

## 2016-08-13 VITALS — BP 96/60 | HR 69 | Temp 97.8°F | Ht 64.0 in | Wt 137.3 lb

## 2016-08-13 DIAGNOSIS — N951 Menopausal and female climacteric states: Secondary | ICD-10-CM | POA: Diagnosis not present

## 2016-08-13 DIAGNOSIS — H6993 Unspecified Eustachian tube disorder, bilateral: Secondary | ICD-10-CM

## 2016-08-13 DIAGNOSIS — Z7689 Persons encountering health services in other specified circumstances: Secondary | ICD-10-CM

## 2016-08-13 DIAGNOSIS — J302 Other seasonal allergic rhinitis: Secondary | ICD-10-CM

## 2016-08-13 DIAGNOSIS — H6983 Other specified disorders of Eustachian tube, bilateral: Secondary | ICD-10-CM

## 2016-08-13 MED ORDER — AMOXICILLIN 875 MG PO TABS: 875.0000 mg | ORAL_TABLET | Freq: Two times a day (BID) | ORAL | 0 refills | Status: DC

## 2016-08-13 MED ORDER — MOMETASONE FUROATE 50 MCG/ACT NA SUSP: 2.0000 | Freq: Every day | NASAL | 1 refills | Status: DC

## 2016-08-13 NOTE — Patient Instructions (Addendum)
BEFORE YOU LEAVE: -follow up: physical with pap smear in one month  Nasonex nasal spray every day for 1 month  Zyrtec at night for 1 month  Take the amoxicillin (antibiotic) if needed if ear is worsening or not improving.  I hope you are feeling better soon! Seek care immediately if worsening, new concerns or you are not improving with treatment.

## 2016-08-13 NOTE — Progress Notes (Signed)
HPI:  Holly Pena is here to establish care and for and acute visit for ear issues (see below).   Has the following chronic problems that require follow up and concerns today:  R ear discomfort: -started about 1-2 months ago with sinus issues -still having some pressure in the R ear -saw her doctor and took doxycycline and prednisone -no fevers, headache, sinus congestion now, ear pain, sore throat, SOB, malaise -chronic frequent headaches - since she was a child - unchanged  Last pap and physical: 4 years ago and normal. Perimenopausal - last period in June 2017  ROS negative for unless reported above: fevers, unintentional weight loss, hearing or vision loss, chest pain, palpitations, struggling to breath, hemoptysis, melena, hematochezia, hematuria, falls, loc, si, thoughts of self harm  Past Medical History:  Diagnosis Date  . Allergy   . Frequent headaches     Past Surgical History:  Procedure Laterality Date  . CESAREAN SECTION      Family History  Problem Relation Age of Onset  . Cancer Neg Hx   . Heart disease Neg Hx     Social History   Social History  . Marital status: Married    Spouse name: N/A  . Number of children: 2  . Years of education: 14   Occupational History  . student     [employer]   Social History Main Topics  . Smoking status: Never Smoker  . Smokeless tobacco: Never Used  . Alcohol use No  . Drug use: No  . Sexual activity: Yes    Partners: Male    Birth control/ protection: Condom     Comment: married   Other Topics Concern  . None   Social History Narrative   Ms. Halloran is From [place].  She lives with her husband & 2 daughters.    She attends college.    Drinks caffeine,.   Wears her seatbelt. Smoke detector in the home.    Exercises routinely.   Feels safe in her relationships.        Current Outpatient Prescriptions:  .  amoxicillin (AMOXIL) 875 MG tablet, Take 1 tablet (875 mg total) by mouth 2 (two) times daily., Disp: 20  tablet, Rfl: 0 .  mometasone (NASONEX) 50 MCG/ACT nasal spray, Place 2 sprays into the nose daily., Disp: 17 g, Rfl: 1  EXAM:  Vitals:   08/13/16 1309  BP: 96/60  Pulse: 69  Temp: 97.8 F (36.6 C)    Body mass index is 23.57 kg/m.  GENERAL: vitals reviewed and listed above, alert, oriented, appears well hydrated and in no acute distress  HEENT: atraumatic, conjunttiva clear, no obvious abnormalities on inspection of external nose and ears, normal appearance of ear canals and TMs except for bilat ME effusion - mildly discolored on R, clear nasal congestion with boggy pale turbinates, mild post oropharyngeal erythema with PND and cobblestoning, no tonsillar edema or exudate, no sinus TTP  NECK: no obvious masses on inspection  LUNGS: clear to auscultation bilaterally, no wheezes, rales or rhonchi, good air movement  CV: HRRR, no peripheral edema  MS: moves all extremities without noticeable abnormality  PSYCH: pleasant and cooperative, no obvious depression or anxiety  ASSESSMENT AND PLAN:  Discussed the following assessment and plan: More than 50% of over 30  minutes spent in total in caring for this patient was spent face-to-face with the patient, counseling and/or coordinating care.   Encounter to establish care -We reviewed the PMH, PSH, FH, SH, Meds  and Allergies. -We addressed current concerns per orders and patient instructions. -We have advised patient to follow up per instructions below.  Perimenopause  Acute seasonal allergic rhinitis, unspecified trigger - Plan: mometasone (NASONEX) 50 MCG/ACT nasal spray Dysfunction of both eustachian tubes -we discussed possible serious and likely etiologies, workup and treatment, treatment risks and return precautions -after this discussion, Izadora opted for INS, antihistamine, abx if R ear worsening as may be starting to become infected -follow up advised  1 month at CPE to recheck -of course, we advised Dellene  to return or notify  a doctor immediately if symptoms worsen or persist or new concerns arise.   Patient Instructions  BEFORE YOU LEAVE: -follow up: physical with pap smear in one month  Nasonex nasal spray every day for 1 month  Zyrtec at night for 1 month  Take the amoxicillin (antibiotic) if needed if ear is worsening or not improving.  I hope you are feeling better soon! Seek care immediately if worsening, new concerns or you are not improving with treatment.       Colin Benton R.

## 2016-08-13 NOTE — Progress Notes (Signed)
Pre visit review using our clinic review tool, if applicable. No additional management support is needed unless otherwise documented below in the visit note. 

## 2016-09-10 ENCOUNTER — Other Ambulatory Visit: Payer: Self-pay | Admitting: *Deleted

## 2016-09-10 MED ORDER — MOMETASONE FUROATE 50 MCG/ACT NA SUSP: 2.0000 | Freq: Every day | NASAL | 0 refills | Status: DC

## 2016-09-10 NOTE — Telephone Encounter (Signed)
Rx done. 

## 2017-05-13 DIAGNOSIS — C539 Malignant neoplasm of cervix uteri, unspecified: Secondary | ICD-10-CM

## 2017-05-13 HISTORY — DX: Malignant neoplasm of cervix uteri, unspecified: C53.9

## 2017-05-22 ENCOUNTER — Encounter: Payer: Self-pay | Admitting: Family Medicine

## 2018-03-03 ENCOUNTER — Encounter: Payer: Self-pay | Admitting: Family Medicine

## 2018-03-03 ENCOUNTER — Ambulatory Visit: Payer: BLUE CROSS/BLUE SHIELD | Admitting: Family Medicine

## 2018-03-03 VITALS — BP 116/74 | HR 72 | Temp 98.2°F | Resp 12 | Ht 64.0 in | Wt 138.1 lb

## 2018-03-03 DIAGNOSIS — N938 Other specified abnormal uterine and vaginal bleeding: Secondary | ICD-10-CM | POA: Diagnosis not present

## 2018-03-03 DIAGNOSIS — R1011 Right upper quadrant pain: Secondary | ICD-10-CM

## 2018-03-03 NOTE — Progress Notes (Signed)
ACUTE VISIT   HPI:  Chief Complaint  Patient presents with  . Right side pain    started yesterday    Holly Pena is a 52 y.o. female, who is here today complaining of RUQ abdominal pain.   We have a translator during visit to help with interrogation.  A day of RUQ pain, she feels like a "hard" place, "lump." She has not identified exacerbating or alleviating factors. It does not seem to change with oral intake. She denies fever, chills, chest pain, cough, nausea, vomiting, changes in bowel habits, or urinary symptoms.  She cannot describe type of pain, it is not radiated, intermittently, 3-4/10. She has not noted any rash, numbness, or tingling.  She has not taking OTC medication. Problem seems to be stable.  She has not had colon cancer screening/colonoscopy done.   She also mentions vaginal bleeding intermittently a week ago. LMP 2 years ago. She denies pelvic pain or vaginal discharge. She has an appointment with gynecologist on 03/10/2018.   Review of Systems  Constitutional: Negative for activity change, appetite change, fatigue, fever and unexpected weight change.  HENT: Negative for mouth sores, nosebleeds and sore throat.   Respiratory: Negative for cough, shortness of breath and wheezing.   Gastrointestinal: Positive for abdominal pain. Negative for abdominal distention, blood in stool, nausea and vomiting.  Genitourinary: Positive for vaginal bleeding. Negative for dysuria, frequency, hematuria, pelvic pain and vaginal discharge.  Musculoskeletal: Negative for back pain and myalgias.  Skin: Negative for rash.  Neurological: Negative for numbness.  Hematological: Negative for adenopathy. Does not bruise/bleed easily.    No current outpatient medications on file prior to visit.   No current facility-administered medications on file prior to visit.      Past Medical History:  Diagnosis Date  . Allergy   . Frequent headaches    No Known  Allergies  Social History   Socioeconomic History  . Marital status: Married    Spouse name: Not on file  . Number of children: 2  . Years of education: 32  . Highest education level: Not on file  Occupational History  . Occupation: Ship broker    Comment: [employer]  . Financial resource strain: Not on file  . Food insecurity:    Worry: Not on file    Inability: Not on file  . Transportation needs:    Medical: Not on file    Non-medical: Not on file  Tobacco Use  . Smoking status: Never Smoker  . Smokeless tobacco: Never Used  Substance and Sexual Activity  . Alcohol use: No  . Drug use: No  . Sexual activity: Yes    Partners: Male    Birth control/protection: Condom    Comment: married  Lifestyle  . Physical activity:    Days per week: Not on file    Minutes per session: Not on file  . Stress: Not on file  Relationships  . Social connections:    Talks on phone: Not on file    Gets together: Not on file    Attends religious service: Not on file    Active member of club or organization: Not on file    Attends meetings of clubs or organizations: Not on file    Relationship status: Not on file  Other Topics Concern  . Not on file  Social History Narrative   Holly Pena is From [place].  She lives with her husband & 2 daughters.  She attends college.    Drinks caffeine,.   Wears her seatbelt. Smoke detector in the home.    Exercises routinely.   Feels safe in her relationships.    Vitals:   03/03/18 1611  BP: 116/74  Pulse: 72  Resp: 12  Temp: 98.2 F (36.8 C)  SpO2: 98%   Body mass index is 23.71 kg/m.   Physical Exam  Nursing note and vitals reviewed. Constitutional: She is oriented to person, place, and time. She appears well-developed and well-nourished. She does not appear ill. No distress.  HENT:  Head: Normocephalic and atraumatic.  Mouth/Throat: Oropharynx is clear and moist and mucous membranes are normal.  Eyes: Conjunctivae are normal.  No scleral icterus.  Cardiovascular: Normal rate and regular rhythm.  No murmur heard. Respiratory: Effort normal and breath sounds normal. No respiratory distress.  GI: Soft. Bowel sounds are normal. She exhibits no distension and no mass. There is no hepatomegaly. There is tenderness in the right upper quadrant. There is no rigidity, no rebound and no guarding.  Genitourinary:  Genitourinary Comments: Deferred to gyn.  Musculoskeletal: She exhibits no edema.  Lymphadenopathy:    She has no cervical adenopathy.  Neurological: She is alert and oriented to person, place, and time. She has normal strength. Gait normal.  Skin: Skin is warm. No rash noted. No erythema.  Psychiatric: Her mood appears anxious.  Well groomed, good eye contact.    ASSESSMENT AND PLAN:   Holly Pena was seen today for right side pain.  Orders Placed This Encounter  Procedures  . US Abdomen Limited RUQ  . Comprehensive metabolic panel  . CBC  . TSH    Lab Results  Component Value Date   TSH 1.40 03/03/2018   Lab Results  Component Value Date   ALT 7 03/03/2018   AST 13 03/03/2018   ALKPHOS 52 03/03/2018   BILITOT 0.4 03/03/2018   Lab Results  Component Value Date   CREATININE 0.84 03/03/2018   BUN 18 03/03/2018   NA 139 03/03/2018   K 4.4 03/03/2018   CL 104 03/03/2018   CO2 28 03/03/2018   Lab Results  Component Value Date   WBC 4.6 03/03/2018   HGB 13.4 03/03/2018   HCT 39.4 03/03/2018   MCV 88.8 03/03/2018   PLT 273.0 03/03/2018    RUQ abdominal pain  We discussed possible etiologies. Examination today does not suggest an acute abdomen. ?  Gallbladder disease, dyspepsia, musculoskeletal among some to consider. We discussed options at this time, since pain just started yesterday we could hold on imaging for now. But she would like to have work-up done today. Clearly instructed about warning signs. Follow-up with PCP in 2 weeks.  -     US Abdomen Limited RUQ; Future -      Comprehensive metabolic panel -     CBC  DUB (dysfunctional uterine bleeding)  We discussed possible causes. She already has an appointment with gynecologist, I strongly recommend keeping appointment. Further recommendation will be given according to lab results.  -     CBC -     TSH     Return in about 2 weeks (around 03/17/2018) for abd pain with PCP.      Betty G. Martinique, MD  [facility]. [place] office.

## 2018-03-03 NOTE — Patient Instructions (Signed)
  [name] I have seen you today for an acute visit.  A few things to remember from today's visit:   RUQ abdominal pain - Plan: US Abdomen Limited RUQ, Comprehensive metabolic panel, CBC  DUB (dysfunctional uterine bleeding) - Plan: CBC, TSH   Follow a bland diet for the next few days, small meals, adequate hydration.   GET HELP RIGHT AWAY IF:   The pain is does not go away within 2 hours.  Sudden severe/worsening pain.  You keep throwing up (vomiting).  The pain changes and is only in the right or left part of the belly.  Not being able to pass gas or poop.  You have bloody or tarry looking poop.   MAKE SURE YOU:   Understand these instructions.  Will watch your condition.  Will get help right away if you are not doing well or get worse.   If symptoms are persistent please arrange a follow up appointment.     In general please monitor for signs of worsening symptoms and seek immediate medical attention if any concerning.    I hope you get better soon!

## 2018-03-04 ENCOUNTER — Encounter: Payer: Self-pay | Admitting: Family Medicine

## 2018-03-04 LAB — COMPREHENSIVE METABOLIC PANEL
ALK PHOS: 52 U/L (ref 39–117)
ALT: 7 U/L (ref 0–35)
AST: 13 U/L (ref 0–37)
Albumin: 4.2 g/dL (ref 3.5–5.2)
BILIRUBIN TOTAL: 0.4 mg/dL (ref 0.2–1.2)
BUN: 18 mg/dL (ref 6–23)
CALCIUM: 9.7 mg/dL (ref 8.4–10.5)
CO2: 28 mEq/L (ref 19–32)
Chloride: 104 mEq/L (ref 96–112)
Creatinine, Ser: 0.84 mg/dL (ref 0.40–1.20)
GFR: 75.85 mL/min (ref >60.00)
Glucose, Bld: 86 mg/dL (ref 70–99)
Potassium: 4.4 mEq/L (ref 3.5–5.1)
Sodium: 139 mEq/L (ref 135–145)
Total Protein: 6.8 g/dL (ref 6.0–8.3)

## 2018-03-04 LAB — CBC
HCT: 39.4 % (ref 36.0–46.0)
Hemoglobin: 13.4 g/dL (ref 12.0–15.0)
MCHC: 34.1 g/dL (ref 30.0–36.0)
MCV: 88.8 fl (ref 78.0–100.0)
PLATELETS: 273 10*3/uL (ref 150.0–400.0)
RBC: 4.44 Mil/uL (ref 3.87–5.11)
RDW: 13.2 % (ref 11.5–15.5)
WBC: 4.6 10*3/uL (ref 4.0–10.5)

## 2018-03-04 LAB — TSH: TSH: 1.4 u[IU]/mL (ref 0.35–4.50)

## 2018-03-10 ENCOUNTER — Ambulatory Visit (INDEPENDENT_AMBULATORY_CARE_PROVIDER_SITE_OTHER): Payer: BLUE CROSS/BLUE SHIELD | Admitting: Obstetrics and Gynecology

## 2018-03-10 ENCOUNTER — Other Ambulatory Visit (HOSPITAL_COMMUNITY)
Admission: RE | Admit: 2018-03-10 | Discharge: 2018-03-10 | Disposition: A | Discharge status: Home / Self Care | Payer: BLUE CROSS/BLUE SHIELD | Source: Ambulatory Visit | Attending: Obstetrics and Gynecology | Admitting: Obstetrics and Gynecology

## 2018-03-10 ENCOUNTER — Encounter: Payer: Self-pay | Admitting: Obstetrics and Gynecology

## 2018-03-10 ENCOUNTER — Other Ambulatory Visit: Payer: Self-pay | Admitting: Obstetrics and Gynecology

## 2018-03-10 VITALS — BP 108/63 | HR 76 | Ht 63.0 in | Wt 138.0 lb

## 2018-03-10 DIAGNOSIS — C541 Malignant neoplasm of endometrium: Secondary | ICD-10-CM | POA: Diagnosis not present

## 2018-03-10 DIAGNOSIS — Z3202 Encounter for pregnancy test, result negative: Secondary | ICD-10-CM

## 2018-03-10 DIAGNOSIS — N950 Postmenopausal bleeding: Secondary | ICD-10-CM

## 2018-03-10 DIAGNOSIS — Z1239 Encounter for other screening for malignant neoplasm of breast: Secondary | ICD-10-CM

## 2018-03-10 DIAGNOSIS — Z1151 Encounter for screening for human papillomavirus (HPV): Secondary | ICD-10-CM | POA: Diagnosis not present

## 2018-03-10 DIAGNOSIS — Z01411 Encounter for gynecological examination (general) (routine) with abnormal findings: Secondary | ICD-10-CM

## 2018-03-10 DIAGNOSIS — Z124 Encounter for screening for malignant neoplasm of cervix: Secondary | ICD-10-CM | POA: Diagnosis not present

## 2018-03-10 HISTORY — PX: ENDOMETRIAL BIOPSY: SHX622

## 2018-03-10 LAB — POCT URINE PREGNANCY: Preg Test, Ur: NEGATIVE

## 2018-03-10 NOTE — Progress Notes (Signed)
Pt presents for annual and pap. Pt c/o PMB started 10/11-10/12. She noticed bright red spotting with clots Oct 18, Oct 24 and Oct 25th. Pt denies cramping/pain. STD screening offered; pt declined.  Never had a mammogram. Never had a colonoscopy.

## 2018-03-10 NOTE — Progress Notes (Signed)
Subjective:     Holly Pena is a 52 y.o. female P2 with BMI 24 postmenopausal for the past 2 years presenting today for annual exam and evaluation of postmenopausal vaginal bleeding. and is here for a comprehensive physical exam. The patient reports onset of vaginal bleeding for the past several days in October, initially presenting as vaginal spotting but last week she passed small clots. She reports mild cramping pain. Patient has not had a gynecologic exam with pap smear in several years. She has never had a mammogram. She is sexually active with her husband for 3 months during the year as [place]. She has not had any recent intercourse. She reports some dysuria. She denies any abnormal discharge  Past Medical History:  Diagnosis Date  . Allergy   . Frequent headaches    Past Surgical History:  Procedure Laterality Date  . CESAREAN SECTION     Family History  Problem Relation Age of Onset  . Healthy Mother   . Healthy Father   . Cancer Neg Hx   . Heart disease Neg Hx     Social History   Socioeconomic History  . Marital status: Married    Spouse name: Not on file  . Number of children: 2  . Years of education: 82  . Highest education level: Not on file  Occupational History  . Occupation: Ship broker    Comment: [place]  . Financial resource strain: Not on file  . Food insecurity:    Worry: Not on file    Inability: Not on file  . Transportation needs:    Medical: Not on file    Non-medical: Not on file  Tobacco Use  . Smoking status: Never Smoker  . Smokeless tobacco: Never Used  Substance and Sexual Activity  . Alcohol use: No  . Drug use: No  . Sexual activity: Yes    Partners: Male    Birth control/protection: Condom, None, Post-menopausal    Comment: married  Lifestyle  . Physical activity:    Days per week: Not on file    Minutes per session: Not on file  . Stress: Not on file  Relationships  . Social connections:    Talks on phone: Not on  file    Gets together: Not on file    Attends religious service: Not on file    Active member of club or organization: Not on file    Attends meetings of clubs or organizations: Not on file    Relationship status: Not on file  . Intimate partner violence:    Fear of current or ex partner: Not on file    Emotionally abused: Not on file    Physically abused: Not on file    Forced sexual activity: Not on file  Other Topics Concern  . Not on file  Social History Narrative   Holly Pena is From [place].  She lives with her husband & 2 daughters.    She attends college.    Drinks caffeine,.   Wears her seatbelt. Smoke detector in the home.    Exercises routinely.   Feels safe in her relationships.   Health Maintenance  Topic Date Due  . HIV Screening  10/04/1981  . PAP SMEAR  05/13/2014  . MAMMOGRAM  10/04/2016  . COLONOSCOPY  10/04/2016  . INFLUENZA VACCINE  06/26/2018 (Originally 12/11/2017)  . TETANUS/TDAP  09/04/2023       Review of Systems Pertinent items are noted in HPI.  Objective:  Blood pressure 108/63, pulse 76, height 5\' 3"  (1.6 m), weight 138 lb (62.6 kg), last menstrual period 02/20/2018.     GENERAL: Well-developed, well-nourished female in no acute distress.  HEENT: Normocephalic, atraumatic. Sclerae anicteric.  NECK: Supple. Normal thyroid.  LUNGS: Clear to auscultation bilaterally.  HEART: Regular rate and rhythm. BREASTS: Symmetric in size. No palpable masses or lymphadenopathy, skin changes, or nipple drainage. ABDOMEN: Soft, nontender, nondistended. No organomegaly. PELVIC: Normal external female genitalia. Vagina is pink and rugated.  Normal discharge. Normal appearing cervix, very bulky and friable (Speculum placement and pap smear caused cervical bleeding). Uterus is normal in size. No adnexal mass or tenderness. EXTREMITIES: No cyanosis, clubbing, or edema, 2+ distal pulses.    Assessment:    Healthy female exam.      Plan:    Pap smear  collected Screening mammogram ordered Pelvic ultrasound ordered Discussed endometrial biopsy ENDOMETRIAL BIOPSY     The indications for endometrial biopsy were reviewed.   Risks of the biopsy including cramping, bleeding, infection, uterine perforation, inadequate specimen and need for additional procedures  were discussed. The patient states she understands and agrees to undergo procedure today. Consent was signed. Time out was performed. Urine HCG was negative. A sterile speculum was placed in the patient's vagina and the cervix was prepped with Betadine. A single-toothed tenaculum was placed on the anterior lip of the cervix to stabilize it. The uterine cavity was sounded to a depth of 8 cm using the uterine sound. The 3 mm pipelle was introduced into the endometrial cavity without difficulty, 2 passes were made.  A  moderate amount of tissue was  sent to pathology. The instruments were removed from the patient's vagina. Minimal bleeding from the cervix was noted. The patient tolerated the procedure well.  Routine post-procedure instructions were given to the patient. The patient will follow up in two weeks to review the results and for further management.   See After Visit Summary for Counseling Recommendations

## 2018-03-12 LAB — URINE CULTURE: Organism ID, Bacteria: NO GROWTH

## 2018-03-13 LAB — CYTOLOGY - PAP
HPV (WINDOPATH): DETECTED — AB
HPV 16/18/45 genotyping: POSITIVE — AB

## 2018-03-17 ENCOUNTER — Ambulatory Visit (HOSPITAL_COMMUNITY)
Admission: RE | Admit: 2018-03-17 | Discharge: 2018-03-17 | Disposition: A | Discharge status: Home / Self Care | Payer: BLUE CROSS/BLUE SHIELD | Source: Ambulatory Visit | Attending: Obstetrics and Gynecology | Admitting: Obstetrics and Gynecology

## 2018-03-17 DIAGNOSIS — Z01411 Encounter for gynecological examination (general) (routine) with abnormal findings: Secondary | ICD-10-CM

## 2018-03-17 DIAGNOSIS — N950 Postmenopausal bleeding: Secondary | ICD-10-CM | POA: Diagnosis not present

## 2018-03-18 ENCOUNTER — Encounter: Payer: Self-pay | Admitting: Obstetrics and Gynecology

## 2018-03-18 ENCOUNTER — Ambulatory Visit: Payer: BLUE CROSS/BLUE SHIELD | Admitting: Obstetrics and Gynecology

## 2018-03-18 ENCOUNTER — Other Ambulatory Visit: Payer: Self-pay

## 2018-03-18 VITALS — BP 117/78 | HR 75 | Wt 140.0 lb

## 2018-03-18 DIAGNOSIS — Z712 Person consulting for explanation of examination or test findings: Secondary | ICD-10-CM | POA: Diagnosis not present

## 2018-03-18 DIAGNOSIS — C539 Malignant neoplasm of cervix uteri, unspecified: Secondary | ICD-10-CM | POA: Diagnosis not present

## 2018-03-18 NOTE — Patient Instructions (Signed)
You will be scheduled to see Dr. Denman George or Dr. Claudell Kyle   Cervical Cancer The cervix is the opening and bottom part of the uterus between the vagina and the uterus. Cervical cancer is a fairly common cancer. It occurs most often in women between the ages of 85 years and 98 years. Cells of the cervix act very much like skin cells. These cells are exposed to toxins, viruses, and bacteria that may cause abnormal changes. There are two kinds of cancers of the cervix:  Squamous cell carcinoma. This type of cancer starts in the flat or scale-like cells that line the cervix. Squamous cell carcinoma can develop from a sexually transmitted infection caused by the human papillomavirus (HPV).  Adenocarcinoma. This type of cervical cancer starts in glandular cells that line the cervix.  What increases the risk? The risk of getting cancer of the cervix is related to your lifestyle, sexual history, health, and immune system. Risks for cervical cancer include:  Having a sexually transmitted viral infection. These include: ? Chlamydia. ? Herpes. ? HPV.  Becoming sexually active before age 29 years.  Having more than one sexual partner or having sex with someone who has more than one sexual partner.  Not using condoms with sexual partners.  Having had cancer of the vagina or vulva.  Having a sexual partner who has or had cancer of the penis or who has had a sexual partner with abnormal cervical cells (dysplasia) or cervical cancer.  Using oral contraceptives (also called birth control pills).  Smoking.  Having a weakened immune system. For example, human immunodeficiency virus (HIV) or other immune deficiency disorders.  Being the daughter of a woman who took diethylstilbestrol (DES) during pregnancy.  Having a sister or mother who has had cancer of the cervix.  Being [place] American, Hispanic, Asian, or a woman from the [place].  A history of dysplasia of the cervix.  What are the  signs or symptoms? Symptoms are usually not present in the early stages of cervical cancer. Once the cancer invades the cervix and surrounding tissues, the woman may have:  Abnormal vaginal bleeding or menstrual bleeding that is longer or heavier than usual.  Bleeding after intercourse, douching, or a Pap test.  Vaginal bleeding following menopause.  Abnormal vaginal discharge.  Pelvic discomfort or pain.  An abnormal Pap test.  Pain during sexual intercourse.  Symptoms of more advanced cervical cancer may include:  Loss of appetite or weight loss.  Tiredness (fatigue).  Back and leg pain.  Inability to control urination or bowel movements.  How is this diagnosed? A pelvic exam and Pap test are done to diagnose the condition. If abnormalities are found during the exam or Pap test, the Pap test may be repeated in 3 months, or your health care provider may do additional tests or procedures, such as:  A colposcopy. This is a procedure that uses a special microscope that allows the health care provider to magnify and closely examine the cells of the cervix, vagina, and vulva.  Cervical biopsies. This is a procedure where small tissue samples are taken from the cervix to be examined under a microscope by a specialist.  A cone biopsy. This is a procedure to test for or remove cancerous tissue.  Other tests may be needed, including:  Cystoscopy.  Proctoscopy or sigmoidoscopy.  Ultrasound.  CT scan.  MRI.  Laparoscopy.  There are different stages of cervical cancer:  Stage 0, carcinoma in situ (CIS)-This first stage of cancer  is the last and most serious stage of dysplasia.  Stage I-This means the tumor is in the uterus and cervix only.  Stage II-This means the tumor has spread to the upper vagina. The cancer has spread beyond the uterus but not to the pelvic walls or lower third of the vagina.  Stage III-This means the tumor has invaded the side wall of the pelvis  and the lower third of the vagina. If the tumor blocks the tubes that carry urine to the bladder (ureters), it may cause urine to back up and the kidneys to swell (hydronephrosis).  Stage IV-This means the tumor has spread to the rectum or bladder. In the later part of this stage, it has also spread to distant organs, like the lungs.  How is this treated? Treatment options can include:  Cone biopsy to remove the cancerous tissue.  Removal of the entire uterus and cervix.  Removal of the uterus, cervix, upper vagina, lymph nodes, and surrounding tissue (modified radical hysterectomy). The ovaries may be left in place or removed.  Medicines to treat cancer.  A combination of surgery, radiation, and chemotherapy.  Biological response modifiers. These are substances that help strengthen your immune system's fight against cancer or infection. They may be used in combination with chemotherapy.  Follow these instructions at home:  Get a gynecology exam and Pap test once every year or as directed by your health care provider.  Get the HPV vaccine.  Do not smoke.  Do not have sexual intercourse until your health care provider says it is okay.  Use a condom every time you have sex. Contact a health care provider if:  You have increased pelvic pain or pressure.  Your are becoming increasingly tired.  You have increased leg or back pain.  You have a fever.  You have abnormal bleeding or discharge.  You lose weight. Get help right away if:  You cannot urinate.  You have blood in your urine.  You have blood or pressure with a bowel movement.  You develop severe back, stomach, or pelvic pain. This information is not intended to replace advice given to you by your health care provider. Make sure you discuss any questions you have with your health care provider. Document Released: 04/29/2005 Document Revised: 10/11/2015 Document Reviewed: 10/21/2012 Elsevier Interactive Patient  Education  2017 [employer].

## 2018-03-18 NOTE — Progress Notes (Signed)
52 yo G2P2 here to discuss test results. Patient was seen for the evaluation of postmenopausal vaginal bleeding. Patient reports persistent vaginal spotting following her pelvic ultrasound yesterday. Patient denies heavy flow or pelvic pain   Past Medical History:  Diagnosis Date  . Allergy   . Frequent headaches    Past Surgical History:  Procedure Laterality Date  . CESAREAN SECTION     Family History  Problem Relation Age of Onset  . Healthy Mother   . Healthy Father   . Cancer Neg Hx   . Heart disease Neg Hx    Social History   Tobacco Use  . Smoking status: Never Smoker  . Smokeless tobacco: Never Used  Substance Use Topics  . Alcohol use: No  . Drug use: No   ROS See pertinent in HPI  Blood pressure 117/78, pulse 75, weight 140 lb (63.5 kg), last menstrual period 02/20/2018. GENERAL: Well-developed, well-nourished female in no acute distress.  NEURO: alert and oriented x 3  US Pelvic Complete With Transvaginal  Result Date: 03/17/2018 CLINICAL DATA:  Patient with postmenopausal bleeding. EXAM: TRANSABDOMINAL AND TRANSVAGINAL ULTRASOUND OF PELVIS TECHNIQUE: Both transabdominal and transvaginal ultrasound examinations of the pelvis were performed. Transabdominal technique was performed for global imaging of the pelvis including uterus, ovaries, adnexal regions, and pelvic cul-de-sac. It was necessary to proceed with endovaginal exam following the transabdominal exam to visualize the endometrium. COMPARISON:  None FINDINGS: Uterus Measurements: 7.2 x 2.3 x 3.7 cm = volume: 32.4 mL. No fibroids or other mass visualized. Endometrium Thickness: 7 mm.  No focal abnormality visualized. Right ovary Not visualized. Left ovary Not visualized. Other findings No abnormal free fluid. IMPRESSION: Endometrium measures 7 mm. In the setting of post-menopausal bleeding, endometrial sampling is indicated to exclude carcinoma. If results are benign, sonohysterogram should be considered for  focal lesion work-up. (Ref: Radiological Reasoning: Algorithmic Workup of Abnormal Vaginal Bleeding with Endovaginal Sonography and Sonohysterography. AJR 2008; 532:D92-42) Electronically Signed   By: Lovey Newcomer M.D.   On: 03/17/2018 15:24   Recent Results (from the past 2160 hour(s))  Comprehensive metabolic panel     Status: None   Collection Time: 03/03/18  4:45 PM  Result Value Ref Range   Sodium 139 135 - 145 mEq/L   Potassium 4.4 3.5 - 5.1 mEq/L   Chloride 104 96 - 112 mEq/L   CO2 28 19 - 32 mEq/L   Glucose, Bld 86 70 - 99 mg/dL   BUN 18 6 - 23 mg/dL   Creatinine, Ser 0.84 0.40 - 1.20 mg/dL   Total Bilirubin 0.4 0.2 - 1.2 mg/dL   Alkaline Phosphatase 52 39 - 117 U/L   AST 13 0 - 37 U/L   ALT 7 0 - 35 U/L   Total Protein 6.8 6.0 - 8.3 g/dL   Albumin 4.2 3.5 - 5.2 g/dL   Calcium 9.7 8.4 - 10.5 mg/dL   GFR 75.85 >60.00 mL/min  CBC     Status: None   Collection Time: 03/03/18  4:45 PM  Result Value Ref Range   WBC 4.6 4.0 - 10.5 K/uL   RBC 4.44 3.87 - 5.11 Mil/uL   Platelets 273.0 150.0 - 400.0 K/uL   Hemoglobin 13.4 12.0 - 15.0 g/dL   HCT 39.4 36.0 - 46.0 %   MCV 88.8 78.0 - 100.0 fl   MCHC 34.1 30.0 - 36.0 g/dL   RDW 13.2 11.5 - 15.5 %  TSH     Status: None   Collection Time:  03/03/18  4:45 PM  Result Value Ref Range   TSH 1.40 0.35 - 4.50 uIU/mL  Cytology - PAP     Status: Abnormal   Collection Time: 03/10/18 12:00 AM  Result Value Ref Range   Adequacy (A)     Satisfactory for evaluation  endocervical/transformation zone component PRESENT.   Diagnosis SQUAMOUS CELL CARCINOMA. (A)    Diagnosis SEE COMMENT. (A)    Diagnosis COMMENT: (A)    Diagnosis (A)     THE CASE WAS DISCUSSED WITH DR. Koraima Albertsen ON 03/12/2018.   HPV 16/18/45 genotyping POSITIVE for HPV 16 (A)     Comment: Normal Reference Range - Negative   HPV DETECTED (A)     Comment: Normal Reference Range - NOT Detected   Material Submitted CervicoVaginal Pap [ThinPrep Imaged] (A)   POCT urine pregnancy      Status: None   Collection Time: 03/10/18  3:15 PM  Result Value Ref Range   Preg Test, Ur Negative Negative  Urine Culture     Status: None   Collection Time: 03/10/18  3:27 PM  Result Value Ref Range   Urine Culture, Routine Final report    Organism ID, Bacteria No growth    03/10/2018 Endometrial biopsy Endometrium, biopsy - SQUAMOUS CELL CARCINOMA.  A/P 52 yo with cervical cancer - Emotional support provided - Answered all questions - Patient will be referred to [facility] for further management - Patient scheduled for mammogram in December - Mandarin interpreter was present during the encounter

## 2018-03-19 ENCOUNTER — Ambulatory Visit: Payer: BLUE CROSS/BLUE SHIELD | Admitting: Family Medicine

## 2018-03-20 ENCOUNTER — Encounter: Payer: Self-pay | Admitting: Obstetrics

## 2018-03-20 ENCOUNTER — Inpatient Hospital Stay: Payer: BLUE CROSS/BLUE SHIELD | Attending: Obstetrics | Admitting: Obstetrics

## 2018-03-20 VITALS — BP 128/90 | HR 77 | Temp 97.6°F | Resp 20 | Ht 63.0 in | Wt 137.8 lb

## 2018-03-20 DIAGNOSIS — D069 Carcinoma in situ of cervix, unspecified: Secondary | ICD-10-CM

## 2018-03-20 DIAGNOSIS — C539 Malignant neoplasm of cervix uteri, unspecified: Secondary | ICD-10-CM | POA: Diagnosis not present

## 2018-03-20 DIAGNOSIS — D099 Carcinoma in situ, unspecified: Secondary | ICD-10-CM

## 2018-03-20 NOTE — Patient Instructions (Signed)
Plan to have an examination under anesthesia, cervical biopsies, possible cystoscopy at the [facility] on March 24, 2018.  You will receive a phone call from the pre-surgical RN to discuss instructions including what medications to take if any and what time to arrive.  Please call for any questions or concerns.

## 2018-03-20 NOTE — Progress Notes (Signed)
[place] at [facility] Note: New Patient FIRST VISIT   Consult was requested by Dr. Mora Pena for presumed cervical cancer   Chief Complaint  Patient presents with  . Squamous cell carcinoma in situ    GYN Oncologic Summary 1. TBD o .  HPI: [name]  is a very nice 52 y.o.  P2  She went through menopause ~2017. On February 20, 2018 she noted post-menopausal bleeding that lasted ~ 2 weeks (intermittent). She presented to her PCP who referred her on to Dr. Elly Pena. On exam she was noted to have a bulky and friable cervix. A Pap and EMB were performed.   A TVUS was ordered and done 03/17/18 revealing a normal uterus with 22mm EM lining. No adnexal masses.  Pap and EMB revealed squamous cell CA.  She is thus referred for management and recommendations.  Her last pelvic/Pap was at the time of her last pregnancy ~16 years ago  Imported EPIC Oncologic History:   No history exists.    Measurement of disease: TBD . Marland Kitchen  Radiology: US Pelvic Complete With Transvaginal  Result Date: 03/17/2018 CLINICAL DATA:  Patient with postmenopausal bleeding. EXAM: TRANSABDOMINAL AND TRANSVAGINAL ULTRASOUND OF PELVIS TECHNIQUE: Both transabdominal and transvaginal ultrasound examinations of the pelvis were performed. Transabdominal technique was performed for global imaging of the pelvis including uterus, ovaries, adnexal regions, and pelvic cul-de-sac. It was necessary to proceed with endovaginal exam following the transabdominal exam to visualize the endometrium. COMPARISON:  None FINDINGS: Uterus Measurements: 7.2 x 2.3 x 3.7 cm = volume: 32.4 mL. No fibroids or other mass visualized. Endometrium Thickness: 7 mm.  No focal abnormality visualized. Right ovary Not visualized. Left ovary Not visualized. Other findings No abnormal free fluid. IMPRESSION: Endometrium measures 7 mm. In the setting of post-menopausal bleeding, endometrial sampling is  indicated to exclude carcinoma. If results are benign, sonohysterogram should be considered for focal lesion work-up. (Ref: Radiological Reasoning: Algorithmic Workup of Abnormal Vaginal Bleeding with Endovaginal Sonography and Sonohysterography. AJR 2008; 149:F02-63) Electronically Signed   By: Lovey Newcomer M.D.   On: 03/17/2018 15:24   .   Outpatient Encounter Medications as of 03/20/2018  Medication Sig  . Cholecalciferol (VITAMIN D3 PO) Take by mouth as needed.    No facility-administered encounter medications on file as of 03/20/2018.    No Known Allergies  Past Medical History:  Diagnosis Date  . Allergy   . Frequent headaches    Past Surgical History:  Procedure Laterality Date  . CESAREAN SECTION     x 2        Past Gynecological History:   GYNECOLOGIC HISTORY:  . Patient's last menstrual period was 02/20/2018. age 89 . Menarche: 52 years old . P 2 . Contraceptive condoms . HRT None  . Last Pap  Referral pap SCCa Family Hx:  Family History  Problem Relation Age of Onset  . Healthy Mother   . Hypertension Mother   . Healthy Father   . Cancer Neg Hx   . Heart disease Neg Hx    Social Hx:  Marland Kitchen Tobacco use: none . Alcohol use: none . Illicit Drug use: none . Illicit IV Drug use: none    Review of Systems: Review of Systems  Genitourinary: Positive for vaginal bleeding.   All other systems reviewed and are negative.  Vitals:  Vitals:   03/20/18 1524  BP: 128/90  Pulse: 77  Resp: 20  Temp: 97.6 F (  36.4 C)  SpO2: 100%   Vitals:   03/20/18 1524  Weight: 137 lb 12.8 oz (62.5 kg)  Height: 5\' 3"  (1.6 m)   Body mass index is 24.41 kg/m.  Physical Exam: General :  Well developed, 52 y.o., female in no apparent distress HEENT:  Normocephalic/atraumatic, symmetric, EOMI, eyelids normal Neck:   Supple, no masses.  Lymphatics:  No cervical/ submandibular/ supraclavicular/ infraclavicular/ inguinal adenopathy Respiratory:  Respirations unlabored, no use of  accessory muscles CV:   Deferred Breast:  Deferred Musculoskeletal: No CVA tenderness, normal muscle strength. Abdomen:  Soft, non-tender and nondistended. No evidence of hernia. No masses. Extremities:  No lymphedema, no erythema, non-tender. Skin:   Normal inspection Neuro/Psych:  No focal motor deficit, no abnormal mental status. Normal gait. Normal affect. Alert and oriented to person, place, and time  Genito Urinary: Vulva: Normal external female genitalia.  Bladder/urethra: Urethral meatus normal in size and location. No lesions or   masses, well supported bladder Speculum exam: Blood in vault. Limited exam due to discomfort and mass effect with narrow canal Vagina: No lesion, no discharge. Cervix: Unable to visualize well.  Bimanual exam: +palpable mass on cervix that feels longer than it is wide, possibly 3 cm in length? By 2cm in width. Again limited 2/2 patient intolerance.  Uterus: Unable to delineate well.   Adnexal region: No masses. Rectal:  Good tone, no masses, no cul de sac nodularity, no parametrial involvement or nodularity. Unable to tolerate true rectovaginal exam.   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 Complexity of visit ? This is a new problem and additional workup is planned ? EUA (see below) ? Data reviewed ? There are no images to review ? We will order a CT +/- PET depending on her final clinical stage ? We did review her pathology (EMB and Pap showing SCCa)  ? These are cytologic studies and I have recommended a biopsy specimen ? I reviewed her referring doctor's office notes and I have summarized in the HPI ? History was obtained from the patient through the help of an interpreter ? This is an acute illness that may pose a threat to life if untreated.  ? Plan is for minor surgery with risk factor for bleeding 2. She is very  uncomfortable during the exam ? I recommend EUA +/- cystoscopy to better assess whether she is a candidate for hysterectomy versus radiation. ? At the same time I will perform biopsies of the cervix  3. We discussed hysterectomy versus radiation but the options will depend on further workup 4. Followup open at this time  ? If she is delegated to chemo/RT I will send those referrals before seeing her back. ? I should know this on the day of the EUA ? If she is delegated to radical hysterectomy I may need to schedule with a partner to expedite her care  Face to face time with patient was 60 minutes. Over 50% of this time was spent on counseling and coordination of care.   Mart Piggs, MD Gynecologic Oncologist 03/20/2018, 5:42 PM    Cc: Holly Bellman, MD (Referring Ob/Gyn) Lucretia Kern, DO (PCP)

## 2018-03-20 NOTE — H&P (View-Only) (Signed)
[place] at [facility] Note: New Patient FIRST VISIT   Consult was requested by Dr. Mora Bellman for presumed cervical cancer   Chief Complaint  Patient presents with  . Squamous cell carcinoma in situ    GYN Oncologic Summary 1. TBD o .  HPI: [name]  is a very nice 52 y.o.  P2  She went through menopause ~2017. On February 20, 2018 she noted post-menopausal bleeding that lasted ~ 2 weeks (intermittent). She presented to her PCP who referred her on to Dr. Elly Modena. On exam she was noted to have a bulky and friable cervix. A Pap and EMB were performed.   A TVUS was ordered and done 03/17/18 revealing a normal uterus with 27mm EM lining. No adnexal masses.  Pap and EMB revealed squamous cell CA.  She is thus referred for management and recommendations.  Her last pelvic/Pap was at the time of her last pregnancy ~16 years ago  Imported EPIC Oncologic History:   No history exists.    Measurement of disease: TBD . Marland Kitchen  Radiology: US Pelvic Complete With Transvaginal  Result Date: 03/17/2018 CLINICAL DATA:  Patient with postmenopausal bleeding. EXAM: TRANSABDOMINAL AND TRANSVAGINAL ULTRASOUND OF PELVIS TECHNIQUE: Both transabdominal and transvaginal ultrasound examinations of the pelvis were performed. Transabdominal technique was performed for global imaging of the pelvis including uterus, ovaries, adnexal regions, and pelvic cul-de-sac. It was necessary to proceed with endovaginal exam following the transabdominal exam to visualize the endometrium. COMPARISON:  None FINDINGS: Uterus Measurements: 7.2 x 2.3 x 3.7 cm = volume: 32.4 mL. No fibroids or other mass visualized. Endometrium Thickness: 7 mm.  No focal abnormality visualized. Right ovary Not visualized. Left ovary Not visualized. Other findings No abnormal free fluid. IMPRESSION: Endometrium measures 7 mm. In the setting of post-menopausal bleeding, endometrial sampling is  indicated to exclude carcinoma. If results are benign, sonohysterogram should be considered for focal lesion work-up. (Ref: Radiological Reasoning: Algorithmic Workup of Abnormal Vaginal Bleeding with Endovaginal Sonography and Sonohysterography. AJR 2008; 998:P38-25) Electronically Signed   By: Lovey Newcomer M.D.   On: 03/17/2018 15:24   .   Outpatient Encounter Medications as of 03/20/2018  Medication Sig  . Cholecalciferol (VITAMIN D3 PO) Take by mouth as needed.    No facility-administered encounter medications on file as of 03/20/2018.    No Known Allergies  Past Medical History:  Diagnosis Date  . Allergy   . Frequent headaches    Past Surgical History:  Procedure Laterality Date  . CESAREAN SECTION     x 2        Past Gynecological History:   GYNECOLOGIC HISTORY:  . Patient's last menstrual period was 02/20/2018. age 30 . Menarche: 52 years old . P 2 . Contraceptive condoms . HRT None  . Last Pap  Referral pap SCCa Family Hx:  Family History  Problem Relation Age of Onset  . Healthy Mother   . Hypertension Mother   . Healthy Father   . Cancer Neg Hx   . Heart disease Neg Hx    Social Hx:  Marland Kitchen Tobacco use: none . Alcohol use: none . Illicit Drug use: none . Illicit IV Drug use: none    Review of Systems: Review of Systems  Genitourinary: Positive for vaginal bleeding.   All other systems reviewed and are negative.  Vitals:  Vitals:   03/20/18 1524  BP: 128/90  Pulse: 77  Resp: 20  Temp: 97.6 F (  36.4 C)  SpO2: 100%   Vitals:   03/20/18 1524  Weight: 137 lb 12.8 oz (62.5 kg)  Height: 5\' 3"  (1.6 m)   Body mass index is 24.41 kg/m.  Physical Exam: General :  Well developed, 52 y.o., female in no apparent distress HEENT:  Normocephalic/atraumatic, symmetric, EOMI, eyelids normal Neck:   Supple, no masses.  Lymphatics:  No cervical/ submandibular/ supraclavicular/ infraclavicular/ inguinal adenopathy Respiratory:  Respirations unlabored, no use of  accessory muscles CV:   Deferred Breast:  Deferred Musculoskeletal: No CVA tenderness, normal muscle strength. Abdomen:  Soft, non-tender and nondistended. No evidence of hernia. No masses. Extremities:  No lymphedema, no erythema, non-tender. Skin:   Normal inspection Neuro/Psych:  No focal motor deficit, no abnormal mental status. Normal gait. Normal affect. Alert and oriented to person, place, and time  Genito Urinary: Vulva: Normal external female genitalia.  Bladder/urethra: Urethral meatus normal in size and location. No lesions or   masses, well supported bladder Speculum exam: Blood in vault. Limited exam due to discomfort and mass effect with narrow canal Vagina: No lesion, no discharge. Cervix: Unable to visualize well.  Bimanual exam: +palpable mass on cervix that feels longer than it is wide, possibly 3 cm in length? By 2cm in width. Again limited 2/2 patient intolerance.  Uterus: Unable to delineate well.   Adnexal region: No masses. Rectal:  Good tone, no masses, no cul de sac nodularity, no parametrial involvement or nodularity. Unable to tolerate true rectovaginal exam.   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 Complexity of visit ? This is a new problem and additional workup is planned ? EUA (see below) ? Data reviewed ? There are no images to review ? We will order a CT +/- PET depending on her final clinical stage ? We did review her pathology (EMB and Pap showing SCCa)  ? These are cytologic studies and I have recommended a biopsy specimen ? I reviewed her referring doctor's office notes and I have summarized in the HPI ? History was obtained from the patient through the help of an interpreter ? This is an acute illness that may pose a threat to life if untreated.  ? Plan is for minor surgery with risk factor for bleeding 2. She is very  uncomfortable during the exam ? I recommend EUA +/- cystoscopy to better assess whether she is a candidate for hysterectomy versus radiation. ? At the same time I will perform biopsies of the cervix  3. We discussed hysterectomy versus radiation but the options will depend on further workup 4. Followup open at this time  ? If she is delegated to chemo/RT I will send those referrals before seeing her back. ? I should know this on the day of the EUA ? If she is delegated to radical hysterectomy I may need to schedule with a partner to expedite her care  Face to face time with patient was 60 minutes. Over 50% of this time was spent on counseling and coordination of care.   Mart Piggs, MD Gynecologic Oncologist 03/20/2018, 5:42 PM    Cc: Mora Bellman, MD (Referring Ob/Gyn) Lucretia Kern, DO (PCP)

## 2018-03-23 ENCOUNTER — Other Ambulatory Visit: Payer: Self-pay

## 2018-03-23 ENCOUNTER — Encounter (HOSPITAL_BASED_OUTPATIENT_CLINIC_OR_DEPARTMENT_OTHER): Payer: Self-pay

## 2018-03-23 NOTE — Progress Notes (Signed)
Speaks and understands some English/Mandarin interpreter requested Spoke with:  Merle NPO:  After Midnight, no gum, candy, or mints   Arrival time:  0900AM Labs: UPT (CBC/CMP 03/03/2018 epic/chart) AM medications: None Pre op orders: Yes Ride home:  Diego Cory (husband) [phone]

## 2018-03-24 ENCOUNTER — Encounter (HOSPITAL_BASED_OUTPATIENT_CLINIC_OR_DEPARTMENT_OTHER): Payer: Self-pay | Admitting: *Deleted

## 2018-03-24 ENCOUNTER — Ambulatory Visit (HOSPITAL_BASED_OUTPATIENT_CLINIC_OR_DEPARTMENT_OTHER)
Admission: RE | Admit: 2018-03-24 | Discharge: 2018-03-24 | Disposition: A | Discharge status: Home / Self Care | Payer: BLUE CROSS/BLUE SHIELD | Source: Ambulatory Visit | Attending: Obstetrics | Admitting: Obstetrics

## 2018-03-24 ENCOUNTER — Encounter (HOSPITAL_BASED_OUTPATIENT_CLINIC_OR_DEPARTMENT_OTHER)
Admission: RE | Disposition: A | Discharge status: Home / Self Care | Payer: Self-pay | Source: Ambulatory Visit | Attending: Obstetrics

## 2018-03-24 ENCOUNTER — Ambulatory Visit (HOSPITAL_BASED_OUTPATIENT_CLINIC_OR_DEPARTMENT_OTHER): Payer: BLUE CROSS/BLUE SHIELD | Admitting: Anesthesiology

## 2018-03-24 DIAGNOSIS — D069 Carcinoma in situ of cervix, unspecified: Secondary | ICD-10-CM | POA: Diagnosis present

## 2018-03-24 DIAGNOSIS — N950 Postmenopausal bleeding: Secondary | ICD-10-CM | POA: Insufficient documentation

## 2018-03-24 DIAGNOSIS — C539 Malignant neoplasm of cervix uteri, unspecified: Secondary | ICD-10-CM | POA: Insufficient documentation

## 2018-03-24 HISTORY — DX: Malignant neoplasm of cervix uteri, unspecified: C53.9

## 2018-03-24 HISTORY — DX: Postmenopausal bleeding: N95.0

## 2018-03-24 HISTORY — DX: Carcinoma in situ, unspecified: D09.9

## 2018-03-24 SURGERY — EXAM UNDER ANESTHESIA
Anesthesia: General | Site: Cervix

## 2018-03-24 MED ORDER — OXYCODONE HCL 5 MG/5ML PO SOLN
5.0000 mg | Freq: Once | ORAL | Status: AC | PRN
  Filled 2018-03-24: qty 5

## 2018-03-24 MED ORDER — MIDAZOLAM HCL 2 MG/2ML IJ SOLN
INTRAMUSCULAR | Status: AC
  Filled 2018-03-24: qty 2

## 2018-03-24 MED ORDER — LIDOCAINE 2% (20 MG/ML) 5 ML SYRINGE
INTRAMUSCULAR | Status: AC
  Filled 2018-03-24: qty 5

## 2018-03-24 MED ORDER — ONDANSETRON HCL 4 MG/2ML IJ SOLN
INTRAMUSCULAR | Status: AC
  Filled 2018-03-24: qty 2

## 2018-03-24 MED ORDER — OXYCODONE HCL 5 MG PO TABS
5.0000 mg | ORAL_TABLET | Freq: Once | ORAL | Status: AC | PRN
  Administered 2018-03-24: 5 mg via ORAL
  Filled 2018-03-24: qty 1

## 2018-03-24 MED ORDER — MIDAZOLAM HCL 2 MG/2ML IJ SOLN
INTRAMUSCULAR | Status: DC | PRN
  Administered 2018-03-24: .5 mg via INTRAVENOUS

## 2018-03-24 MED ORDER — FENTANYL CITRATE (PF) 100 MCG/2ML IJ SOLN
INTRAMUSCULAR | Status: AC
  Filled 2018-03-24: qty 2

## 2018-03-24 MED ORDER — DEXAMETHASONE SODIUM PHOSPHATE 10 MG/ML IJ SOLN
INTRAMUSCULAR | Status: DC | PRN
  Administered 2018-03-24: 5 mg via INTRAVENOUS

## 2018-03-24 MED ORDER — LACTATED RINGERS IV SOLN
INTRAVENOUS | Status: DC
  Administered 2018-03-24 (×2): via INTRAVENOUS
  Filled 2018-03-24: qty 1000

## 2018-03-24 MED ORDER — FENTANYL CITRATE (PF) 100 MCG/2ML IJ SOLN
INTRAMUSCULAR | Status: DC | PRN
  Administered 2018-03-24: 25 ug via INTRAVENOUS

## 2018-03-24 MED ORDER — ONDANSETRON HCL 4 MG/2ML IJ SOLN
INTRAMUSCULAR | Status: DC | PRN
  Administered 2018-03-24: 4 mg via INTRAVENOUS

## 2018-03-24 MED ORDER — LIDOCAINE 2% (20 MG/ML) 5 ML SYRINGE
INTRAMUSCULAR | Status: DC | PRN
  Administered 2018-03-24: 40 mg via INTRAVENOUS

## 2018-03-24 MED ORDER — PROPOFOL 10 MG/ML IV BOLUS
INTRAVENOUS | Status: AC
  Filled 2018-03-24: qty 40

## 2018-03-24 MED ORDER — MEPERIDINE HCL 25 MG/ML IJ SOLN
6.2500 mg | INTRAMUSCULAR | Status: DC | PRN
  Filled 2018-03-24: qty 1

## 2018-03-24 MED ORDER — PROMETHAZINE HCL 25 MG/ML IJ SOLN
6.2500 mg | INTRAMUSCULAR | Status: DC | PRN
  Filled 2018-03-24: qty 1

## 2018-03-24 MED ORDER — PROPOFOL 10 MG/ML IV BOLUS
INTRAVENOUS | Status: DC | PRN
  Administered 2018-03-24: 140 mg via INTRAVENOUS

## 2018-03-24 MED ORDER — FERRIC SUBSULFATE 259 MG/GM EX SOLN
CUTANEOUS | Status: DC | PRN
  Administered 2018-03-24: 1

## 2018-03-24 MED ORDER — HYDROMORPHONE HCL 1 MG/ML IJ SOLN
0.2500 mg | INTRAMUSCULAR | Status: DC | PRN
  Filled 2018-03-24: qty 0.5

## 2018-03-24 MED ORDER — IODINE STRONG (LUGOLS) 5 % PO SOLN
ORAL | Status: DC | PRN
  Administered 2018-03-24: 0.2 mL

## 2018-03-24 MED ORDER — OXYCODONE HCL 5 MG PO TABS
ORAL_TABLET | ORAL | Status: AC
  Filled 2018-03-24: qty 1

## 2018-03-24 MED ORDER — DEXAMETHASONE SODIUM PHOSPHATE 10 MG/ML IJ SOLN
INTRAMUSCULAR | Status: AC
  Filled 2018-03-24: qty 1

## 2018-03-24 SURGICAL SUPPLY — 33 items
APPLICATOR COTTON TIP 6 STRL (MISCELLANEOUS) IMPLANT
APPLICATOR COTTON TIP 6IN STRL (MISCELLANEOUS)
BLADE EXTENDED COATED 6.5IN (ELECTRODE) ×2 IMPLANT
BLADE SURG 11 STRL SS (BLADE) IMPLANT
BLADE SURG 15 STRL LF DISP TIS (BLADE) IMPLANT
BLADE SURG 15 STRL SS (BLADE)
CANISTER SUCT 3000ML PPV (MISCELLANEOUS) ×2 IMPLANT
CATH ROBINSON RED A/P 14FR (CATHETERS) IMPLANT
COVER WAND RF STERILE (DRAPES) ×2 IMPLANT
GLOVE BIO SURGEON STRL SZ 6.5 (GLOVE) ×4 IMPLANT
GLOVE BIO SURGEON STRL SZ7 (GLOVE) IMPLANT
GOWN STRL REUS W/TWL LRG LVL3 (GOWN DISPOSABLE) ×2 IMPLANT
KIT TURNOVER CYSTO (KITS) ×2 IMPLANT
NEEDLE HYPO 25X1 1.5 SAFETY (NEEDLE) IMPLANT
NS IRRIG 500ML POUR BTL (IV SOLUTION) ×2 IMPLANT
PACK VAGINAL WOMENS (CUSTOM PROCEDURE TRAY) ×2 IMPLANT
PAD OB MATERNITY 4.3X12.25 (PERSONAL CARE ITEMS) ×2 IMPLANT
SET IRRIG Y TYPE TUR BLADDER L (SET/KITS/TRAYS/PACK) IMPLANT
SUT MNCRL AB 3-0 PS2 27 (SUTURE) ×2 IMPLANT
SUT VIC AB 0 SH 27 (SUTURE) ×2 IMPLANT
SUT VIC AB 2-0 CT2 27 (SUTURE) IMPLANT
SUT VIC AB 2-0 SH 27 (SUTURE)
SUT VIC AB 2-0 SH 27XBRD (SUTURE) IMPLANT
SUT VIC AB 3-0 FS2 27 (SUTURE) ×2 IMPLANT
SUT VIC AB 3-0 PS2 18 (SUTURE)
SUT VIC AB 3-0 PS2 18XBRD (SUTURE) IMPLANT
SUT VICRYL 2 0 18  UND BR (SUTURE)
SUT VICRYL 2 0 18 UND BR (SUTURE) IMPLANT
SWAB OB GYN 8IN STERILE 2PK (MISCELLANEOUS) IMPLANT
SYR BULB IRRIGATION 50ML (SYRINGE) ×2 IMPLANT
TOWEL OR 17X24 6PK STRL BLUE (TOWEL DISPOSABLE) ×4 IMPLANT
UNDERPAD 30X30 (UNDERPADS AND DIAPERS) ×2 IMPLANT
WATER STERILE IRR 500ML POUR (IV SOLUTION) ×2 IMPLANT

## 2018-03-24 NOTE — Op Note (Signed)
OPERATIVE NOTE 03/24/18   Surgeon: Mart Piggs, MD NOTE - Dr. Gery Pray was present and participated in the [place]  Assistants: none  Anesthesia: General LMA anesthesia  Pre-operative Diagnosis:  Suspect SCCa Cervix based on endometrial biopsy  Post-operative Diagnosis:  Stage IB1 (FIGO 2018) SCCa Cervix  Operation:  Exam under anesthesia Cervical biopsies  Operative Findings:  Anterior cervical lip with gross lesion from ~10-12:00. Remainder of cervix grossly normal. Estimate lesion to be <2cm. No vaginal or parametrial involvement.  Estimated Blood Loss:  Minimal             Specimens: Cervical biopsies 12:00, 3:00, 9:00, 5:05         Complications:  None; patient tolerated the procedure well.         Disposition: PACU - hemodynamically stable.  Procedure Details:  The patient was then taken to the operating room and placed in the supine position with SCD hose on. General anesthesia was then induced without difficulty. She was then placed in the dorsolithotomy position. The perineum was prepped with Betadine. The vagina was prepped with Betadine gently be me. The patient was then draped after the prep was dried. An in and out catheterization to empty the bladder was performed under sterile conditions.  Timeout was performed the patient, procedure, antibiotic, allergy, and length of procedure.   Exam under anesthesia revealed a firm cervix with some irregular surface on anterior lip, otherwise normal. No vaginal disease on palpation, no parametrial involvement on rectovaginal. Mobile uterus. Dr. Sondra Come was present and examined the patient and is in agreement with above.  Speculum was inserted and the anterior cervical lip noted to be friable. A suspected gross lesion at ~10-12:00, but not fungating. Lugols showed decreased uptake all surfaces.  Several biopsies from 12:00 were performed and the consistency of the tissue was suspicious for typical carcinoma. Additional  biopsies from 3:00, 6:00, 9:00 to be sure.  Hemostasis was obtained with Monsel's and bovie cautery.  All instrument, suture, laparotomy, Ray-Tec, and needle counts were correct x2. The patient tolerated the procedure well and was taken recovery room in stable condition.

## 2018-03-24 NOTE — Anesthesia Procedure Notes (Signed)
Procedure Name: LMA Insertion Date/Time: 03/24/2018 10:59 AM Performed by: Wanita Chamberlain, CRNA Pre-anesthesia Checklist: Patient identified, Timeout performed, Emergency Drugs available, Patient being monitored and Suction available Patient Re-evaluated:Patient Re-evaluated prior to induction Oxygen Delivery Method: Circle system utilized Preoxygenation: Pre-oxygenation with 100% oxygen Induction Type: IV induction Ventilation: Mask ventilation without difficulty LMA: LMA inserted LMA Size: 4.0 Number of attempts: 1 Placement Confirmation: CO2 detector,  positive ETCO2 and breath sounds checked- equal and bilateral Tube secured with: Tape Dental Injury: Teeth and Oropharynx as per pre-operative assessment

## 2018-03-24 NOTE — Discharge Instructions (Signed)
Cervical Biopsy, Care After Refer to this sheet in the next few weeks. These instructions provide you with information about caring for yourself after your procedure. Your health care provider may also give you more specific instructions. Your treatment has been planned according to current medical practices, but problems sometimes occur. Call your health care provider if you have any problems or questions after your procedure. What can I expect after the procedure? After the procedure, it is common to have:  Cramping or mild pain for a few days.  Slight bleeding from the vagina for a few days.  Dark-colored vaginal discharge for a few days.  Follow these instructions at home:  Take over-the-counter and prescription medicines only as told by your health care provider.  Return to your normal activities as told by your health care provider. Ask your health care provider what activities are safe for you.  Use a sanitary napkin until bleeding and discharge stop.  Do not use tampons until your health care provider approves.  Do not douche until your health care provider approves.  Do not have sex until your health care provider approves.  Keep all follow-up visits as told by your health care provider. This is important. Contact a health care provider if:  You have a fever or chills.  You have bad-smelling vaginal discharge.  You have itching or irritation around the vagina.  You have lower abdominal pain. Get help right away if:  You develop heavy vaginal bleeding that soaks more than one sanitary pad an hour.  You faint.  You have very bad lower abdominal pain. This information is not intended to replace advice given to you by your health care provider. Make sure you discuss any questions you have with your health care provider. Document Released: 01/18/2015 Document Revised: 10/05/2015 Document Reviewed: 09/14/2014 Elsevier Interactive Patient Education  2018 [place] [facility] Care Instructions  Activity: Get plenty of rest for the remainder of the day. A responsible adult should stay with you for 24 hours following the procedure.  For the next 24 hours, DO NOT: -Drive a car -Paediatric nurse -Drink alcoholic beverages -Take any medication unless instructed by your physician -Make any legal decisions or sign important papers.  Meals: Start with liquid foods such as gelatin or soup. Progress to regular foods as tolerated. Avoid greasy, spicy, heavy foods. If nausea and/or vomiting occur, drink only clear liquids until the nausea and/or vomiting subsides. Call your physician if vomiting continues.  Special Instructions/Symptoms: Your throat may feel dry or sore from the anesthesia or the breathing tube placed in your throat during surgery. If this causes discomfort, gargle with warm salt water. The discomfort should disappear within 24 hours.

## 2018-03-24 NOTE — Anesthesia Postprocedure Evaluation (Signed)
Anesthesia Post Note  Patient: Holly Pena  Procedure(s) Performed: EXAM UNDER ANESTHESIA CERVICAL BIOPSIES (N/A Cervix)     Patient location during evaluation: PACU Anesthesia Type: General Level of consciousness: awake and alert Pain management: pain level controlled Vital Signs Assessment: post-procedure vital signs reviewed and stable Respiratory status: spontaneous breathing, nonlabored ventilation and respiratory function stable Cardiovascular status: blood pressure returned to baseline and stable Postop Assessment: no apparent nausea or vomiting Anesthetic complications: no    Last Vitals:  Vitals:   03/24/18 1215 03/24/18 1300  BP: 119/77 127/89  Pulse: (!) 57 62  Resp: 16 16  Temp:  36.8 C  SpO2: 100% 100%    Last Pain:  Vitals:   03/24/18 1300  TempSrc:   PainSc: 2                  [name]

## 2018-03-24 NOTE — Interval H&P Note (Signed)
History and Physical Interval Note:  03/24/2018 10:48 AM  Holly Pena  has presented today for surgery, with the diagnosis of cervical cancer  The various methods of treatment have been discussed with the patient and family. After consideration of risks, benefits and other options for treatment, the patient has consented to  Procedure(s): CERVICAL BIOPSIES AND POSSIBLE CYSTOSCOPY (N/A) as a surgical intervention .  The patient's history has been reviewed, patient examined, no change in status, stable for surgery.  I have reviewed the patient's chart and labs.  Questions were answered to the patient's satisfaction.     Isabel Caprice

## 2018-03-24 NOTE — Transfer of Care (Signed)
Immediate Anesthesia Transfer of Care Note  Patient: Holly Pena  Procedure(s) Performed: Jasmine December UNDER ANESTHESIA CERVICAL BIOPSIES (N/A Cervix)  Patient Location: PACU  Anesthesia Type:General  Level of Consciousness: awake, alert , oriented and patient cooperative  Airway & Oxygen Therapy: Patient Spontanous Breathing and Patient connected to nasal cannula oxygen  Post-op Assessment: Report given to RN and Post -op Vital signs reviewed and stable  Post vital signs: Reviewed and stable  Last Vitals:  Vitals Value Taken Time  BP    Temp    Pulse 60 03/24/2018 11:44 AM  Resp    SpO2 100 % 03/24/2018 11:44 AM  Vitals shown include unvalidated device data.  Last Pain:  Vitals:   03/24/18 0917  TempSrc: Oral         Complications: No apparent anesthesia complications

## 2018-03-24 NOTE — Anesthesia Preprocedure Evaluation (Addendum)
Anesthesia Evaluation  Patient identified by MRN, date of birth, ID band Patient awake    Reviewed: Allergy & Precautions, NPO status , Patient's Chart, lab work & pertinent test results  Airway Mallampati: II  TM Distance: >3 FB Neck ROM: Full    Dental no notable dental hx. (+) Teeth Intact, Dental Advisory Given,    Pulmonary neg pulmonary ROS,    Pulmonary exam normal breath sounds clear to auscultation       Cardiovascular negative cardio ROS Normal cardiovascular exam Rhythm:Regular Rate:Normal     Neuro/Psych  Headaches, negative psych ROS   GI/Hepatic negative GI ROS, Neg liver ROS,   Endo/Other  negative endocrine ROS  Renal/GU negative Renal ROS  negative genitourinary   Musculoskeletal negative musculoskeletal ROS (+)   Abdominal   Peds negative pediatric ROS (+)  Hematology negative hematology ROS (+)   Anesthesia Other Findings   Reproductive/Obstetrics negative OB ROS                            Anesthesia Physical Anesthesia Plan  ASA: II  Anesthesia Plan: General   Post-op Pain Management:    Induction: Intravenous  PONV Risk Score and Plan: 3 and Ondansetron, Dexamethasone and Midazolam  Airway Management Planned: LMA  Additional Equipment:   Intra-op Plan:   Post-operative Plan: Extubation in OR  Informed Consent: I have reviewed the patients History and Physical, chart, labs and discussed the procedure including the risks, benefits and alternatives for the proposed anesthesia with the patient or authorized representative who has indicated his/her understanding and acceptance.   Dental advisory given  Plan Discussed with: CRNA  Anesthesia Plan Comments:         Anesthesia Quick Evaluation

## 2018-03-25 ENCOUNTER — Telehealth: Payer: Self-pay | Admitting: *Deleted

## 2018-03-25 ENCOUNTER — Other Ambulatory Visit: Payer: Self-pay | Admitting: Obstetrics

## 2018-03-25 DIAGNOSIS — C531 Malignant neoplasm of exocervix: Secondary | ICD-10-CM

## 2018-03-25 NOTE — Telephone Encounter (Signed)
Called and scheduled the patient to be seen on 11/27 for scan results per Dr.Phelps.

## 2018-03-25 NOTE — Telephone Encounter (Signed)
Called and gave the patient the dates/times/instructions for her CT/PET scans. Gave the following directions over the phone. "The CT scan is on 11/18 at 2 pm, arrive at 1:45 pm at [facility] in the radiology department.  You are to drink the first bottle on contrast at 12 pm and second bottle at 1 pm.  Nothing to eat/drink after 10 am. You need to come to the office by Friday at 4 pm to pick up the bottles of contrast. The PET scan is on 11/22 at 4 pm arrive at 3:30 pm at [facility] in the radiology department. Nothing to eat/drink after 10 am." Patient wrote down instructions and verbalized understanding.

## 2018-03-30 ENCOUNTER — Ambulatory Visit (HOSPITAL_COMMUNITY)
Admission: RE | Admit: 2018-03-30 | Discharge: 2018-03-30 | Disposition: A | Discharge status: Home / Self Care | Payer: BLUE CROSS/BLUE SHIELD | Source: Ambulatory Visit | Attending: Obstetrics | Admitting: Obstetrics

## 2018-03-30 DIAGNOSIS — C531 Malignant neoplasm of exocervix: Secondary | ICD-10-CM | POA: Insufficient documentation

## 2018-03-30 MED ORDER — IOHEXOL 300 MG/ML  SOLN
100.0000 mL | Freq: Once | INTRAMUSCULAR | Status: AC | PRN
  Administered 2018-03-30: 100 mL via INTRAVENOUS

## 2018-03-30 MED ORDER — SODIUM CHLORIDE (PF) 0.9 % IJ SOLN
INTRAMUSCULAR | Status: AC
  Filled 2018-03-30: qty 50

## 2018-04-03 ENCOUNTER — Telehealth: Payer: Self-pay

## 2018-04-03 ENCOUNTER — Encounter: Payer: Self-pay | Admitting: Obstetrics

## 2018-04-03 ENCOUNTER — Encounter (HOSPITAL_COMMUNITY)
Admission: RE | Admit: 2018-04-03 | Discharge: 2018-04-03 | Disposition: A | Discharge status: Home / Self Care | Payer: BLUE CROSS/BLUE SHIELD | Source: Ambulatory Visit | Attending: Obstetrics | Admitting: Obstetrics

## 2018-04-03 DIAGNOSIS — C531 Malignant neoplasm of exocervix: Secondary | ICD-10-CM | POA: Insufficient documentation

## 2018-04-03 LAB — GLUCOSE, CAPILLARY: Glucose-Capillary: 85 mg/dL (ref 70–99)

## 2018-04-03 MED ORDER — FLUDEOXYGLUCOSE F - 18 (FDG) INJECTION
6.6000 | Freq: Once | INTRAVENOUS | Status: AC | PRN
  Administered 2018-04-03: 6.6 via INTRAVENOUS

## 2018-04-03 NOTE — Telephone Encounter (Signed)
Outgoing call to patient per Joylene John NP regarding that her PET scan appt is this afternoon and to proceed with the appt -that it has been approved.  No answer, left VM with this information.  Also called the interpreter's number Lubertha Basque) and no answer, left her a VM as well to call our office.  Per insurance [phone], pt's portion was the $817.24 and pt has already paid that.  Joylene John NP has answered pt's question, letting her know that PET scan was approved through my chart because pt reached out to her through there but has not received confirmation from pt that she got Melissa's response yet.

## 2018-04-03 NOTE — Telephone Encounter (Signed)
Outgoing call to patient to see if she got my VM regarding PET scan today (was approved per [name] NP- see previous nurse note)- pt states she got the message and she is on her way.  No other needs per pt at this time.

## 2018-04-08 ENCOUNTER — Inpatient Hospital Stay (HOSPITAL_BASED_OUTPATIENT_CLINIC_OR_DEPARTMENT_OTHER): Payer: BLUE CROSS/BLUE SHIELD | Admitting: Obstetrics

## 2018-04-08 ENCOUNTER — Encounter: Payer: Self-pay | Admitting: Obstetrics

## 2018-04-08 VITALS — BP 116/81 | HR 67 | Temp 98.4°F | Resp 18 | Ht 63.0 in | Wt 137.2 lb

## 2018-04-08 DIAGNOSIS — C539 Malignant neoplasm of cervix uteri, unspecified: Secondary | ICD-10-CM | POA: Diagnosis not present

## 2018-04-08 DIAGNOSIS — C531 Malignant neoplasm of exocervix: Secondary | ICD-10-CM

## 2018-04-08 DIAGNOSIS — D069 Carcinoma in situ of cervix, unspecified: Secondary | ICD-10-CM | POA: Diagnosis not present

## 2018-04-08 NOTE — Progress Notes (Signed)
[place] at [facility]   Progress Note: Established Patient Follow-Up Visit   Consult was originally requested by Dr. Mora Bellman for cervical cancer  Chief Complaint  Patient presents with  . Cervical Cancer    GYN Oncologic Summary 1. Stage IB1 SCCa Cervix o .  HPI: [name]  is a very nice 52 y.o.  P2  Interval History Since her last visit I took her to the operating room for an EUA and cervical biopsies due to intolerance of examination in the office. Findings as noted:  Anterior cervical lip with gross lesion from ~10-12:00. Remainder of cervix grossly normal. Estimate lesion to be <2cm. No vaginal or parametrial involvement.   Grossly the remainder of the cervix appeared normal. It was firm on palpation but no fungating lesions. Pathology confirmed what the Pap/EMB showed 1. Cervix, biopsy, 12 o'clock - INVASIVE MODERATELY DIFFERENTIATED SQUAMOUS CELL CARCINOMA. SEE NOTE. 2. Cervix, biopsy, 3 o'clock - HIGH GRADE SQUAMOUS INTRAEPITHELIAL LESION (CIN-III, HIGH GRADE DYSPLASIA). SEE NOTE. 3. Cervix, biopsy, 9 o'clock - INVASIVE MODERATELY DIFFERENTIATED SQUAMOUS CELL CARCINOMA. SEE NOTE. 4. Cervix, biopsy, 6 o'clock - HIGH GRADE SQUAMOUS INTRAEPITHELIAL LESION (CIN-III, HIGH GRADE DYSPLASIA). SEE NOTE. Diagnosis Note 1. 2, 3 and 4. Dr. Melina Copa has reviewed this case and concurs with the above interpretation. (NDK:gt, 03/25/18) [name] MD   I have staged her clinically as a Stage IB1 (FIGO 2018) SCCa Cervix  In addition I ordered CT + PET imaging and we are reviewing those results today.  She returns to review the above and discuss management. She presents with her husband and a Pharmacist, hospital.  Oncologic Course She went through menopause ~2017. On February 20, 2018 she noted post-menopausal bleeding that lasted ~ 2 weeks (intermittent). She presented to her PCP who referred her on to Dr. Elly Modena. On exam she was  noted to have a bulky and friable cervix. A Pap and EMB were performed.   A TVUS was ordered and done 03/17/18 revealing a normal uterus with 65mm EM lining. No adnexal masses.  Pap and EMB revealed squamous cell CA.  She is thus referred for management and recommendations.  Her last pelvic/Pap was at the time of her last pregnancy ~16 years ago  Imported EPIC Oncologic History:   No history exists.    Measurement of disease: TBD . Marland Kitchen  Radiology: Ct Chest W Contrast  Result Date: 03/30/2018 CLINICAL DATA:  New diagnosis of cervical cancer. Staging. Postmenopausal bleeding starting 1 month ago. EXAM: CT CHEST, ABDOMEN, AND PELVIS WITH CONTRAST TECHNIQUE: Multidetector CT imaging of the chest, abdomen and pelvis was performed following the standard protocol during bolus administration of intravenous contrast. CONTRAST:  159mL OMNIPAQUE IOHEXOL 300 MG/ML  SOLN COMPARISON:  Pelvic ultrasound 03/17/2018. FINDINGS: CT CHEST FINDINGS Cardiovascular: Normal aortic caliber. Normal heart size, without pericardial effusion. No central pulmonary embolism, on this non-dedicated study. Mediastinum/Nodes: No supraclavicular adenopathy. No mediastinal or hilar adenopathy. Lungs/Pleura: No pleural fluid.  Clear lungs. Musculoskeletal: No acute osseous abnormality. CT ABDOMEN PELVIS FINDINGS Hepatobiliary: 9 mm hypoattenuating right hepatic lobe lesion on image 58/2 is favored to represent a cyst. A central right hepatic lobe 3 mm lesion is too small to characterize but also most likely a cyst. Normal gallbladder, without biliary ductal dilatation. Pancreas: Normal, without mass or ductal dilatation. Spleen: Normal in size, without focal abnormality. Adrenals/Urinary Tract: Normal adrenal glands. Normal kidneys, without hydronephrosis. Normal urinary bladder. Stomach/Bowel: Normal stomach, without wall thickening. Normal colon,  appendix, and terminal ileum. Normal small bowel. Vascular/Lymphatic: Normal caliber of  the aorta and branch vessels. No abdominopelvic adenopathy. Reproductive: Suspect mild soft tissue fullness within the cervix. No gross parametrial extension. No adnexal mass. Other: No significant free fluid. An isolated low-density lesion lateral to the descending colon measures 2.2 x 2.2 cm on image 85/2. This has calcification in its posterior portion. Musculoskeletal: Sclerotic lesion in the right sacrum is likely a bone island. IMPRESSION: 1. Isolated low-density structure lateral to the descending colon. Differential considerations include isolated peritoneal implant/metastasis, GI duplication/mesenteric cyst, or postoperative collection such as seroma (clinical history only describes prior Caesarean section). Consider further evaluation with PET. 2. Otherwise, no evidence of metastatic disease in the chest, abdomen, or pelvis. Electronically Signed   By: Abigail Miyamoto M.D.   On: 03/30/2018 16:17   Ct Abdomen Pelvis W Contrast  Result Date: 03/30/2018 CLINICAL DATA:  New diagnosis of cervical cancer. Staging. Postmenopausal bleeding starting 1 month ago. EXAM: CT CHEST, ABDOMEN, AND PELVIS WITH CONTRAST TECHNIQUE: Multidetector CT imaging of the chest, abdomen and pelvis was performed following the standard protocol during bolus administration of intravenous contrast. CONTRAST:  187mL OMNIPAQUE IOHEXOL 300 MG/ML  SOLN COMPARISON:  Pelvic ultrasound 03/17/2018. FINDINGS: CT CHEST FINDINGS Cardiovascular: Normal aortic caliber. Normal heart size, without pericardial effusion. No central pulmonary embolism, on this non-dedicated study. Mediastinum/Nodes: No supraclavicular adenopathy. No mediastinal or hilar adenopathy. Lungs/Pleura: No pleural fluid.  Clear lungs. Musculoskeletal: No acute osseous abnormality. CT ABDOMEN PELVIS FINDINGS Hepatobiliary: 9 mm hypoattenuating right hepatic lobe lesion on image 58/2 is favored to represent a cyst. A central right hepatic lobe 3 mm lesion is too small to  characterize but also most likely a cyst. Normal gallbladder, without biliary ductal dilatation. Pancreas: Normal, without mass or ductal dilatation. Spleen: Normal in size, without focal abnormality. Adrenals/Urinary Tract: Normal adrenal glands. Normal kidneys, without hydronephrosis. Normal urinary bladder. Stomach/Bowel: Normal stomach, without wall thickening. Normal colon, appendix, and terminal ileum. Normal small bowel. Vascular/Lymphatic: Normal caliber of the aorta and branch vessels. No abdominopelvic adenopathy. Reproductive: Suspect mild soft tissue fullness within the cervix. No gross parametrial extension. No adnexal mass. Other: No significant free fluid. An isolated low-density lesion lateral to the descending colon measures 2.2 x 2.2 cm on image 85/2. This has calcification in its posterior portion. Musculoskeletal: Sclerotic lesion in the right sacrum is likely a bone island. IMPRESSION: 1. Isolated low-density structure lateral to the descending colon. Differential considerations include isolated peritoneal implant/metastasis, GI duplication/mesenteric cyst, or postoperative collection such as seroma (clinical history only describes prior Caesarean section). Consider further evaluation with PET. 2. Otherwise, no evidence of metastatic disease in the chest, abdomen, or pelvis. Electronically Signed   By: Abigail Miyamoto M.D.   On: 03/30/2018 16:17   Nm Pet Image Initial (pi) Skull Base To Thigh  Result Date: 04/04/2018 CLINICAL DATA:  Initial treatment strategy for cervical cancer. EXAM: NUCLEAR MEDICINE PET SKULL BASE TO THIGH TECHNIQUE: 6.6 mCi F-18 FDG was injected intravenously. Full-ring PET imaging was performed from the skull base to thigh after the radiotracer. CT data was obtained and used for attenuation correction and anatomic localization. Fasting blood glucose: 85 mg/dl COMPARISON:  CTs of the chest, abdomen and pelvis 03/30/2018 FINDINGS: Mediastinal blood pool activity: SUV max  2.7 NECK: There is a single hypermetabolic level 1B lymph node on the right, measuring 8 mm short axis on image 28/4 (SUV max 5.2). There are no other hypermetabolic cervical lymph nodes.There are no lesions  of the pharyngeal mucosal space. Incidental CT findings: none CHEST: There are no hypermetabolic mediastinal, hilar or axillary lymph nodes. No suspicious pulmonary nodules. Incidental CT findings: none ABDOMEN/PELVIS: There is no hypermetabolic activity within the liver, adrenal glands, spleen or pancreas. There is no hypermetabolic nodal activity. There is focal hypermetabolic activity in the cervix (SUV max 11.2). There is no parametrial hypermetabolic activity. The fluid attenuation 2.4 cm collection posterior to the descending colon demonstrates no hypermetabolic activity. Incidental CT findings: No hydronephrosis. SKELETON: There is no hypermetabolic activity to suggest osseous metastatic disease. Incidental CT findings: Stable sclerotic lesion in the right sacrum, likely a bone island. IMPRESSION: 1. Prominent hypermetabolism within the cervix, consistent with known cervical cancer. No parametrial extension of tumor or abdominopelvic nodal metastatic disease. 2. The previously demonstrated low-density structure posterior to the descending colon demonstrates no hypermetabolic activity and is likely an incidental duplication/mesenteric cyst. 3. Focal hypermetabolic activity within a single right neck lymph node (level 1B). This is unlikely to be related to the patient's cervical cancer. No hypermetabolism is seen within the pharyngeal mucosal space. ENT evaluation should be considered, especially if the patient has risk factors for head and neck malignancy. Electronically Signed   By: Richardean Sale M.D.   On: 04/04/2018 12:23   US Pelvic Complete With Transvaginal  Result Date: 03/17/2018 CLINICAL DATA:  Patient with postmenopausal bleeding. EXAM: TRANSABDOMINAL AND TRANSVAGINAL ULTRASOUND OF PELVIS  TECHNIQUE: Both transabdominal and transvaginal ultrasound examinations of the pelvis were performed. Transabdominal technique was performed for global imaging of the pelvis including uterus, ovaries, adnexal regions, and pelvic cul-de-sac. It was necessary to proceed with endovaginal exam following the transabdominal exam to visualize the endometrium. COMPARISON:  None FINDINGS: Uterus Measurements: 7.2 x 2.3 x 3.7 cm = volume: 32.4 mL. No fibroids or other mass visualized. Endometrium Thickness: 7 mm.  No focal abnormality visualized. Right ovary Not visualized. Left ovary Not visualized. Other findings No abnormal free fluid. IMPRESSION: Endometrium measures 7 mm. In the setting of post-menopausal bleeding, endometrial sampling is indicated to exclude carcinoma. If results are benign, sonohysterogram should be considered for focal lesion work-up. (Ref: Radiological Reasoning: Algorithmic Workup of Abnormal Vaginal Bleeding with Endovaginal Sonography and Sonohysterography. AJR 2008; [id number]) Electronically Signed   By: Lovey Newcomer M.D.   On: 03/17/2018 15:24   .   Outpatient Encounter Medications as of 04/08/2018  Medication Sig  . Cholecalciferol (VITAMIN D3 PO) Take by mouth as needed.   . Glucosamine-Chondroitin (MOVE FREE PO) Take by mouth.   No facility-administered encounter medications on file as of 04/08/2018.    No Known Allergies  Past Medical History:  Diagnosis Date  . Allergy   . Cervical cancer ([place])   . Frequent headaches   . PMB (postmenopausal bleeding)   . Squamous cell carcinoma in situ    Past Surgical History:  Procedure Laterality Date  . CESAREAN SECTION     x 2  . ENDOMETRIAL BIOPSY  03/10/2018        Past Gynecological History:   GYNECOLOGIC HISTORY:  . Patient's last menstrual period was 02/20/2018. age 26 . Menarche: 52 years old . P 2 . Contraceptive condoms . HRT None  . Last Pap  Referral pap SCCa Family Hx:  Family History  Problem Relation  Age of Onset  . Healthy Mother   . Hypertension Mother   . Healthy Father   . Cancer Neg Hx   . Heart disease Neg Hx    Social Hx:  .  Tobacco use: none . Alcohol use: none . Illicit Drug use: none . Illicit IV Drug use: none    Review of Systems: Review of Systems  Constitutional: Negative.   HENT:  Negative.   Eyes: Negative.   Respiratory: Negative.   Cardiovascular: Negative.   Gastrointestinal: Negative.   Endocrine: Negative.   Genitourinary: Negative.    Musculoskeletal: Negative.   Skin: Negative.   Neurological: Negative.   Hematological: Negative.   Psychiatric/Behavioral: Negative.   All other systems reviewed and are negative.  Vitals:  Vitals:   04/08/18 1513  BP: 116/81  Pulse: 67  Resp: 18  Temp: 98.4 F (36.9 C)  SpO2: 100%   Vitals:   04/08/18 1513  Weight: 137 lb 3 oz (62.2 kg)  Height: 5\' 3"  (1.6 m)   Body mass index is 24.3 kg/m.  Physical Exam: General :  Well developed, 52 y.o., female in no apparent distress HEENT:  Normocephalic/atraumatic, symmetric, EOMI, eyelids normal Neck:   No visible masses.  Respiratory:  Respirations unlabored, no use of accessory muscles CV:   Deferred Breast:   Deferred Musculoskeletal: Normal muscle strength. Abdomen:  No visible masses or protrusion Extremities:  No visible edema or deformities Skin:   Normal inspection Neuro/Psych:  No focal motor deficit, no abnormal mental status. Normal gait. Normal affect. Alert and oriented to person, place, and time  Genitourinary: Deferred for today (see operative findings above)   Assessment  Stage IB1 SCCa Cervix  Plan  1. Recall her level of intolerance with in-office exam 2. EUA revealed a gross lesion, but <2cm on visualization, thus she is clinical Stage IB1  3. We discussed recommendation for radical hysterectomy  ? Through the interpreter we discussed laparoscopic (robotic) versus open surgery and the risks/benefits of those approaches ? We  discussed the Ulla Potash al data and some of the cited weaknesses of that study. ? I explained that if she chose the laparoscopic route that she would likely need to meet and review the risks again with Dr. Denman George, who would be performing the procedure ? If she chooses open surgery I have Dec 5 blocked for her but she needs to let us know no later than Monday so we can book. 4. The husband asked if [facility] had better machines for surgery and I explained that is not the case. 5. She will think about her options and let us know. 6. We did review the radical hysterectomy procedure with lymph node dissection and I reviewed the increased risks (over simple hysterectomy) for urinary tract, nerve, fistula, and bowel injury.  ? The surgical sketch was used to help reinforce the risks, benefits, and alternatives and she was given a copy to take home ? We reviewed activity restrictions and the need for an indwelling foley catheter at home regardless of her chosen route of surgery ? I reviewed the possible recommendation for postoperative radiation +/- chemo after surgery depending on the final tissue reports 7. Neck hypermetabolism  ? Suspect unrelated to her cervical CA. ? Will plan ENT consult at some point in followup  Face to face time with patient was 60 minutes. Over 50% of this time was spent on counseling and coordination of care.   Mart Piggs, MD Gynecologic Oncologist 04/08/2018, 5:17 PM    Cc: Mora Bellman, MD (Referring Ob/Gyn) Lucretia Kern, DO (PCP)

## 2018-04-08 NOTE — Patient Instructions (Signed)
Please let our office know when you have decided whether you would like to proceed with the surgery in a laparoscopic fashion or open.  Our number is [phone]                Preparing for your Surgery  Plan for surgery with Dr. Precious Haws if you choose the open approach or Dr. Everitt Amber if laparoscopic at [place] will be scheduled for a radical hysterectomy either laparoscopic or open, bilateral salpingo-oophorectomy, lymph node dissection.   Pre-operative Testing -You will receive a phone call from presurgical testing at [facility] to arrange for a pre-operative testing appointment before your surgery.  This appointment normally occurs one to two weeks before your scheduled surgery.   -Bring your insurance card, copy of an advanced directive if applicable, medication list  -At that visit, you will be asked to sign a consent for a possible blood transfusion in case a transfusion becomes necessary during surgery.  The need for a blood transfusion is rare but having consent is a necessary part of your care.     -You should not be taking blood thinners or aspirin at least ten days prior to surgery unless instructed by your surgeon.  Day Before Surgery at [place] will be asked to take in a light diet the day before surgery.  Avoid carbonated beverages.  You will be advised to have nothing to eat or drink after midnight the evening before.    Eat a light diet the day before surgery.  Examples including soups, broths, toast, yogurt, mashed potatoes.  Things to avoid include carbonated beverages (fizzy beverages), raw fruits and raw vegetables, or beans.   If your bowels are filled with gas, your surgeon will have difficulty visualizing your pelvic organs which increases your surgical risks.  Your role in recovery Your role is to become active as soon as directed by your doctor, while still giving yourself time to heal.  Rest when you feel tired. You will be  asked to do the following in order to speed your recovery:  - Cough and breathe deeply. This helps toclear and expand your lungs and can prevent pneumonia. You may be given a spirometer to practice deep breathing. A staff member will show you how to use the spirometer. - Do mild physical activity. Walking or moving your legs help your circulation and body functions return to normal. A staff member will help you when you try to walk and will provide you with simple exercises. Do not try to get up or walk alone the first time. - Actively manage your pain. Managing your pain lets you move in comfort. We will ask you to rate your pain on a scale of zero to 10. It is your responsibility to tell your doctor or nurse where and how much you hurt so your pain can be treated.  Special Considerations -If you are diabetic, you may be placed on insulin after surgery to have closer control over your blood sugars to promote healing and recovery.  This does not mean that you will be discharged on insulin.  If applicable, your oral antidiabetics will be resumed when you are tolerating a solid diet.  -Your final pathology results from surgery should be available around one week after surgery and the results will be relayed to you when available.  -FMLA forms can be faxed to [phone] and please allow 5-7 business days for completion.   Blood Transfusion Information WHAT IS  A BLOOD TRANSFUSION? A transfusion is the replacement of blood or some of its parts. Blood is made up of multiple cells which provide different functions.  Red blood cells carry oxygen and are used for blood loss replacement.  White blood cells fight against infection.  Platelets control bleeding.  Plasma helps clot blood.  Other blood products are available for specialized needs, such as hemophilia or other clotting disorders. BEFORE THE TRANSFUSION  Who gives blood for transfusions?   You may be able to donate blood to be used at  a later date on yourself (autologous donation).  Relatives can be asked to donate blood. This is generally not any safer than if you have received blood from a stranger. The same precautions are taken to ensure safety when a relative's blood is donated.  Healthy volunteers who are fully evaluated to make sure their blood is safe. This is blood bank blood. Transfusion therapy is the safest it has ever been in the practice of medicine. Before blood is taken from a donor, a complete history is taken to make sure that person has no history of diseases nor engages in risky social behavior (examples are intravenous drug use or sexual activity with multiple partners). The donor's travel history is screened to minimize risk of transmitting infections, such as malaria. The donated blood is tested for signs of infectious diseases, such as HIV and hepatitis. The blood is then tested to be sure it is compatible with you in order to minimize the chance of a transfusion reaction. If you or a relative donates blood, this is often done in anticipation of surgery and is not appropriate for emergency situations. It takes many days to process the donated blood. RISKS AND COMPLICATIONS Although transfusion therapy is very safe and saves many lives, the main dangers of transfusion include:   Getting an infectious disease.  Developing a transfusion reaction. This is an allergic reaction to something in the blood you were given. Every precaution is taken to prevent this. The decision to have a blood transfusion has been considered carefully by your caregiver before blood is given. Blood is not given unless the benefits outweigh the risks.

## 2018-04-09 ENCOUNTER — Encounter: Payer: Self-pay | Admitting: Obstetrics

## 2018-04-10 ENCOUNTER — Telehealth: Payer: Self-pay | Admitting: *Deleted

## 2018-04-10 NOTE — Telephone Encounter (Signed)
Per Melissa APP, called and scheduled the patient to see Dr. Denman George on 12/6 to discuss surgery

## 2018-04-13 ENCOUNTER — Telehealth: Payer: Self-pay

## 2018-04-13 ENCOUNTER — Encounter: Payer: Self-pay | Admitting: Gynecologic Oncology

## 2018-04-13 NOTE — Telephone Encounter (Addendum)
Informed pt that her letter was ready to be picked up. I let pt know that I left school letter per [name] NP with volunteer desk in cancer center and pt said she will pick tomorrow. No other needs per pt at this time.

## 2018-04-17 ENCOUNTER — Other Ambulatory Visit: Payer: Self-pay | Admitting: Gynecologic Oncology

## 2018-04-17 ENCOUNTER — Encounter: Payer: Self-pay | Admitting: Gynecologic Oncology

## 2018-04-17 ENCOUNTER — Inpatient Hospital Stay: Payer: BLUE CROSS/BLUE SHIELD | Attending: Obstetrics | Admitting: Gynecologic Oncology

## 2018-04-17 ENCOUNTER — Telehealth: Payer: Self-pay | Admitting: Oncology

## 2018-04-17 VITALS — BP 116/68 | HR 80 | Temp 98.1°F | Resp 16 | Wt 137.0 lb

## 2018-04-17 DIAGNOSIS — R339 Retention of urine, unspecified: Secondary | ICD-10-CM | POA: Insufficient documentation

## 2018-04-17 DIAGNOSIS — C539 Malignant neoplasm of cervix uteri, unspecified: Secondary | ICD-10-CM | POA: Diagnosis not present

## 2018-04-17 DIAGNOSIS — N390 Urinary tract infection, site not specified: Secondary | ICD-10-CM | POA: Insufficient documentation

## 2018-04-17 DIAGNOSIS — Z90710 Acquired absence of both cervix and uterus: Secondary | ICD-10-CM | POA: Insufficient documentation

## 2018-04-17 DIAGNOSIS — Z90722 Acquired absence of ovaries, bilateral: Secondary | ICD-10-CM | POA: Insufficient documentation

## 2018-04-17 NOTE — H&P (View-Only) (Signed)
[place] at [facility]   Progress Note: Established Patient Follow-Up Visit   Consult was originally requested by Dr. Mora Bellman for cervical cancer  Chief Complaint  Patient presents with  . Malignant neoplasm of cervix, unspecified site [facility])    GYN Oncologic Summary 1. Stage IB2 SCCa Cervix o .  HPI: [name]  is a very nice 52 y.o.  P2  Interval History  She returns to discuss surgical planning as after her conversation with Dr Gerarda Fraction in November, she determined that she would like a minimally invasive approach. She presents with her husband and a Pharmacist, hospital.  PET/CT resulted with no apparent metastatic disease (though there was a mildly PET avid node in the neck felt to be unrelated). She had a 3.5cm area of increased avidity in the cervix.   Oncologic Course She went through menopause ~2017. On February 20, 2018 she noted post-menopausal bleeding that lasted ~ 2 weeks (intermittent). She presented to her PCP who referred her on to Dr. Elly Modena. On exam she was noted to have a bulky and friable cervix. A Pap and EMB were performed.   A TVUS was ordered and done 03/17/18 revealing a normal uterus with 20mm EM lining. No adnexal masses.  Pap and EMB revealed squamous cell CA.  She is thus referred for management and recommendations.  Her last pelvic/Pap was at the time of her last pregnancy ~16 years ago  Dr Gerarda Fraction took her to the operating room for an EUA and cervical biopsies due to intolerance of examination in the office. Findings as noted:  Anterior cervical lip with gross lesion from ~10-12:00. Remainder of cervix grossly normal. Estimate lesion to be <2cm. No vaginal or parametrial involvement.   Grossly the remainder of the cervix appeared normal. It was firm on palpation but no fungating lesions. Pathology confirmed what the Pap/EMB showed 1. Cervix, biopsy, 12 o'clock - INVASIVE MODERATELY DIFFERENTIATED  SQUAMOUS CELL CARCINOMA. SEE NOTE. 2. Cervix, biopsy, 3 o'clock - HIGH GRADE SQUAMOUS INTRAEPITHELIAL LESION (CIN-III, HIGH GRADE DYSPLASIA). SEE NOTE. 3. Cervix, biopsy, 9 o'clock - INVASIVE MODERATELY DIFFERENTIATED SQUAMOUS CELL CARCINOMA. SEE NOTE. 4. Cervix, biopsy, 6 o'clock - HIGH GRADE SQUAMOUS INTRAEPITHELIAL LESION (CIN-III, HIGH GRADE DYSPLASIA). SEE NOTE. Diagnosis Note 1. 2, 3 and 4. Dr. Melina Copa has reviewed this case and concurs with the above interpretation. (NDK:gt, 03/25/18) [name] MD   Dr Gerarda Fraction staged her clinically as a Stage IB1 (FIGO 2018) SCCa Cervix and ordered a PET/CT. Because Dr Gerarda Fraction stated that the tumor was <2cm, she counseled the patient that she was a candidate for either MIS or open approach. The patient elected for MIS approach and therefore elected to have surgery with Dr Denman George as Dr Gerarda Fraction does not perform radical hysterectomy via a MIS route.   Imported EPIC Oncologic History:   No history exists.    Measurement of disease: TBD . Marland Kitchen  Radiology: Ct Chest W Contrast  Result Date: 03/30/2018 CLINICAL DATA:  New diagnosis of cervical cancer. Staging. Postmenopausal bleeding starting 1 month ago. EXAM: CT CHEST, ABDOMEN, AND PELVIS WITH CONTRAST TECHNIQUE: Multidetector CT imaging of the chest, abdomen and pelvis was performed following the standard protocol during bolus administration of intravenous contrast. CONTRAST:  139mL OMNIPAQUE IOHEXOL 300 MG/ML  SOLN COMPARISON:  Pelvic ultrasound 03/17/2018. FINDINGS: CT CHEST FINDINGS Cardiovascular: Normal aortic caliber. Normal heart size, without pericardial effusion. No central pulmonary embolism, on this non-dedicated study. Mediastinum/Nodes: No supraclavicular adenopathy. No mediastinal or  hilar adenopathy. Lungs/Pleura: No pleural fluid.  Clear lungs. Musculoskeletal: No acute osseous abnormality. CT ABDOMEN PELVIS FINDINGS Hepatobiliary: 9 mm hypoattenuating right hepatic lobe lesion on image 58/2  is favored to represent a cyst. A central right hepatic lobe 3 mm lesion is too small to characterize but also most likely a cyst. Normal gallbladder, without biliary ductal dilatation. Pancreas: Normal, without mass or ductal dilatation. Spleen: Normal in size, without focal abnormality. Adrenals/Urinary Tract: Normal adrenal glands. Normal kidneys, without hydronephrosis. Normal urinary bladder. Stomach/Bowel: Normal stomach, without wall thickening. Normal colon, appendix, and terminal ileum. Normal small bowel. Vascular/Lymphatic: Normal caliber of the aorta and branch vessels. No abdominopelvic adenopathy. Reproductive: Suspect mild soft tissue fullness within the cervix. No gross parametrial extension. No adnexal mass. Other: No significant free fluid. An isolated low-density lesion lateral to the descending colon measures 2.2 x 2.2 cm on image 85/2. This has calcification in its posterior portion. Musculoskeletal: Sclerotic lesion in the right sacrum is likely a bone island. IMPRESSION: 1. Isolated low-density structure lateral to the descending colon. Differential considerations include isolated peritoneal implant/metastasis, GI duplication/mesenteric cyst, or postoperative collection such as seroma (clinical history only describes prior Caesarean section). Consider further evaluation with PET. 2. Otherwise, no evidence of metastatic disease in the chest, abdomen, or pelvis. Electronically Signed   By: Abigail Miyamoto M.D.   On: 03/30/2018 16:17   Ct Abdomen Pelvis W Contrast  Result Date: 03/30/2018 CLINICAL DATA:  New diagnosis of cervical cancer. Staging. Postmenopausal bleeding starting 1 month ago. EXAM: CT CHEST, ABDOMEN, AND PELVIS WITH CONTRAST TECHNIQUE: Multidetector CT imaging of the chest, abdomen and pelvis was performed following the standard protocol during bolus administration of intravenous contrast. CONTRAST:  113mL OMNIPAQUE IOHEXOL 300 MG/ML  SOLN COMPARISON:  Pelvic ultrasound  03/17/2018. FINDINGS: CT CHEST FINDINGS Cardiovascular: Normal aortic caliber. Normal heart size, without pericardial effusion. No central pulmonary embolism, on this non-dedicated study. Mediastinum/Nodes: No supraclavicular adenopathy. No mediastinal or hilar adenopathy. Lungs/Pleura: No pleural fluid.  Clear lungs. Musculoskeletal: No acute osseous abnormality. CT ABDOMEN PELVIS FINDINGS Hepatobiliary: 9 mm hypoattenuating right hepatic lobe lesion on image 58/2 is favored to represent a cyst. A central right hepatic lobe 3 mm lesion is too small to characterize but also most likely a cyst. Normal gallbladder, without biliary ductal dilatation. Pancreas: Normal, without mass or ductal dilatation. Spleen: Normal in size, without focal abnormality. Adrenals/Urinary Tract: Normal adrenal glands. Normal kidneys, without hydronephrosis. Normal urinary bladder. Stomach/Bowel: Normal stomach, without wall thickening. Normal colon, appendix, and terminal ileum. Normal small bowel. Vascular/Lymphatic: Normal caliber of the aorta and branch vessels. No abdominopelvic adenopathy. Reproductive: Suspect mild soft tissue fullness within the cervix. No gross parametrial extension. No adnexal mass. Other: No significant free fluid. An isolated low-density lesion lateral to the descending colon measures 2.2 x 2.2 cm on image 85/2. This has calcification in its posterior portion. Musculoskeletal: Sclerotic lesion in the right sacrum is likely a bone island. IMPRESSION: 1. Isolated low-density structure lateral to the descending colon. Differential considerations include isolated peritoneal implant/metastasis, GI duplication/mesenteric cyst, or postoperative collection such as seroma (clinical history only describes prior Caesarean section). Consider further evaluation with PET. 2. Otherwise, no evidence of metastatic disease in the chest, abdomen, or pelvis. Electronically Signed   By: Abigail Miyamoto M.D.   On: 03/30/2018 16:17    Nm Pet Image Initial (pi) Skull Base To Thigh  Addendum Date: 04/17/2018   ADDENDUM REPORT: 04/17/2018 11:01 ADDENDUM: The hypermetabolic cervical lesion measures approximately 3.5 x  2.5 cm. Electronically Signed   By: Marijo Sanes M.D.   On: 04/17/2018 11:01   Result Date: 04/17/2018 CLINICAL DATA:  Initial treatment strategy for cervical cancer. EXAM: NUCLEAR MEDICINE PET SKULL BASE TO THIGH TECHNIQUE: 6.6 mCi F-18 FDG was injected intravenously. Full-ring PET imaging was performed from the skull base to thigh after the radiotracer. CT data was obtained and used for attenuation correction and anatomic localization. Fasting blood glucose: 85 mg/dl COMPARISON:  CTs of the chest, abdomen and pelvis 03/30/2018 FINDINGS: Mediastinal blood pool activity: SUV max 2.7 NECK: There is a single hypermetabolic level 1B lymph node on the right, measuring 8 mm short axis on image 28/4 (SUV max 5.2). There are no other hypermetabolic cervical lymph nodes.There are no lesions of the pharyngeal mucosal space. Incidental CT findings: none CHEST: There are no hypermetabolic mediastinal, hilar or axillary lymph nodes. No suspicious pulmonary nodules. Incidental CT findings: none ABDOMEN/PELVIS: There is no hypermetabolic activity within the liver, adrenal glands, spleen or pancreas. There is no hypermetabolic nodal activity. There is focal hypermetabolic activity in the cervix (SUV max 11.2). There is no parametrial hypermetabolic activity. The fluid attenuation 2.4 cm collection posterior to the descending colon demonstrates no hypermetabolic activity. Incidental CT findings: No hydronephrosis. SKELETON: There is no hypermetabolic activity to suggest osseous metastatic disease. Incidental CT findings: Stable sclerotic lesion in the right sacrum, likely a bone island. IMPRESSION: 1. Prominent hypermetabolism within the cervix, consistent with known cervical cancer. No parametrial extension of tumor or abdominopelvic nodal  metastatic disease. 2. The previously demonstrated low-density structure posterior to the descending colon demonstrates no hypermetabolic activity and is likely an incidental duplication/mesenteric cyst. 3. Focal hypermetabolic activity within a single right neck lymph node (level 1B). This is unlikely to be related to the patient's cervical cancer. No hypermetabolism is seen within the pharyngeal mucosal space. ENT evaluation should be considered, especially if the patient has risk factors for head and neck malignancy. Electronically Signed: By: Richardean Sale M.D. On: 04/04/2018 12:23   US Pelvic Complete With Transvaginal  Result Date: 03/17/2018 CLINICAL DATA:  Patient with postmenopausal bleeding. EXAM: TRANSABDOMINAL AND TRANSVAGINAL ULTRASOUND OF PELVIS TECHNIQUE: Both transabdominal and transvaginal ultrasound examinations of the pelvis were performed. Transabdominal technique was performed for global imaging of the pelvis including uterus, ovaries, adnexal regions, and pelvic cul-de-sac. It was necessary to proceed with endovaginal exam following the transabdominal exam to visualize the endometrium. COMPARISON:  None FINDINGS: Uterus Measurements: 7.2 x 2.3 x 3.7 cm = volume: 32.4 mL. No fibroids or other mass visualized. Endometrium Thickness: 7 mm.  No focal abnormality visualized. Right ovary Not visualized. Left ovary Not visualized. Other findings No abnormal free fluid. IMPRESSION: Endometrium measures 7 mm. In the setting of post-menopausal bleeding, endometrial sampling is indicated to exclude carcinoma. If results are benign, sonohysterogram should be considered for focal lesion work-up. (Ref: Radiological Reasoning: Algorithmic Workup of Abnormal Vaginal Bleeding with Endovaginal Sonography and Sonohysterography. AJR 2008; 379:K24-09) Electronically Signed   By: Lovey Newcomer M.D.   On: 03/17/2018 15:24   .   Outpatient Encounter Medications as of 04/17/2018  Medication Sig  .  Cholecalciferol (VITAMIN D3 PO) Take by mouth as needed.   . Glucosamine-Chondroitin (MOVE FREE PO) Take by mouth.   No facility-administered encounter medications on file as of 04/17/2018.    No Known Allergies  Past Medical History:  Diagnosis Date  . Allergy   . Cervical cancer ([place])   . Frequent headaches   . PMB (  postmenopausal bleeding)   . Squamous cell carcinoma in situ    Past Surgical History:  Procedure Laterality Date  . CESAREAN SECTION     x 2  . ENDOMETRIAL BIOPSY  03/10/2018        Past Gynecological History:   GYNECOLOGIC HISTORY:  . Patient's last menstrual period was 02/20/2018. age 66 . Menarche: 52 years old . P 2 . Contraceptive condoms . HRT None  . Last Pap  Referral pap SCCa Family Hx:  Family History  Problem Relation Age of Onset  . Healthy Mother   . Hypertension Mother   . Healthy Father   . Cancer Neg Hx   . Heart disease Neg Hx    Social Hx:  Marland Kitchen Tobacco use: none . Alcohol use: none . Illicit Drug use: none . Illicit IV Drug use: none    Review of Systems: Review of Systems  Constitutional: Negative.   HENT:  Negative.   Eyes: Negative.   Respiratory: Negative.   Cardiovascular: Negative.   Gastrointestinal: Negative.   Endocrine: Negative.   Genitourinary: Negative.    Musculoskeletal: Negative.   Skin: Negative.   Neurological: Negative.   Hematological: Negative.   Psychiatric/Behavioral: Negative.   All other systems reviewed and are negative.  Vitals:  Vitals:   04/17/18 0935  BP: 116/68  Pulse: 80  Resp: 16  Temp: 98.1 F (36.7 C)  SpO2: 99%   Vitals:   04/17/18 0935  Weight: 137 lb (62.1 kg)   Body mass index is 24.27 kg/m.  Physical Exam: General :  Well developed, 52 y.o., female in no apparent distress HEENT:  Normocephalic/atraumatic, symmetric, EOMI, eyelids normal Neck:   No visible masses.  Respiratory:  Respirations unlabored, no use of accessory muscles CV:   Deferred Breast:    Deferred Musculoskeletal: Normal muscle strength. Abdomen:  No visible masses or protrusion Extremities:  No visible edema or deformities Skin:   Normal inspection Neuro/Psych:  No focal motor deficit, no abnormal mental status. Normal gait. Normal affect. Alert and oriented to person, place, and time  Genitourinary: 3cm friable ectocervical lesion on the anterior and right cervix. No vaginal extension, no parametrial extension palpable including on RV exam. Small mobile uterus.    Assessment  Stage IB2 SCCa Cervix  Plan  We reviewed the patient's diagnosis of early stage cervical cancer. On today's examination she is a clinical stage IB2 with a gross lesion estimated at 3.5cm. Treatment options include radical hysterectomy with pelvic lymphadenectomy or radiation. The risks and benefits of both were reviewed. I have emphasized that the cure rates for these two treatments are equivalent; however, I discussed that the toxicity profiles are different between these two modalities.  Risks of primary radiotherapy with sensitizing chemotherapy can include cystitis, proctitis, chronic rectal bleeding sigmoid stricture, small bowel obstruction, ureteral stricture, urinary or enteral fistula, loss of sexual function related to vaginal shortening and atrophy and menopause due to loss of ovarian function.  Surgery is associated with risks of bleeding, blood transfusion, injury to the bowel, bladder, ureter, urinary fistula, vaginal dehiscence, prolonged or permanent bladder dysfunction potentially requiring self-catheterization, lymphedema, infection, thromboembolic events, and possible effects on sexual function. We discussed that intraoperatively, the finding of previously unsuspected spread outside the cervix may lead to the surgery being aborted. Additionally, high or intermediate-risk factors on the final pathology report, such as positive lymph nodes, deep invasion, large tumor size and lymphatic  invasion, will likely lead to a recommendation of postoperative chemotherapy and radiation.  She desires to proceed with radical hysterectomy. Prior to surgery, a PET/CT has been/will be ordered to rule out any distant metastasis that would preclude the patient from being a surgical candidate.  Assuming that the patient is a surgical candidate following imaging, radical hysterectomy via a minimally invasive approach or abdominal procedure was discussed. We reviewed that prior to early 2018 our practice had been to routinely proceed with a robotic assisted procedure. We discussed that prospective data with endometrial cancer showed that outcomes from a minimally invasive procedure were not inferior as compared to one performed via laparotomy. We also discussed that retrospective data evaluating minimally invasive radical hysterectomy had not demonstrated worse outcomes as compared to an abdominal procedure. However, recent prospective data has suggested that there may be worse outcomes from a minimally invasive procedure. This data was published in November, 2018. A retrospective study from a [place] cancer database also seems to support this conclusion. We reviewed the results of these two studies.  Furthermore, we discussed that retrospective survival analysis of our practice from 2000-2017 demonstrates no increased risk for recurrence based on our minimally invasive radical technique. The pros and cons of both procedures were discussed in detail based on available published data. These include, but are not limited to, blood loss, injury to surrounding organs, lymphedema, need for further surgery, wound complications, VTE, and long-term bladder and bowel dysfunction.  After our discussion, the patient wishes to proceed with minimally invasive surgery. I did explain that Dr Gerarda Fraction had counseled her that her tumor was <2cm, but on my exam and on the radiologist's review, her lesion was >2cm. I discussed that risk for  recurrence was higher with tumors >2cm. I discussed the potential need for additional control with radiation for larger tumors.   The patient had already watched videos of cervical cancer MIS surgery and is very eager to proceed with a MIS approach.  Given that she was counseled about the potential increased risk for recurrence and offered an open approach I felt comfortable proceeding with this.   REFERENCES:  1. Rosendo Gros PT, Frumovitz M, Pareja R, et al. Minimally invasive versus abdominal radical hysterectomy for cervicalcancer. N Engl J Med. DOI: 89.3734/[id number].\cb3  2. Melamed A, Margul DJ, Chen L, et al. Survival after minimally invasive radical hysterectomy for early-stage cervical cancer. N Engl J Med. BTD:97.4163/[id number]  3. Sert BM, Boggess JF, Rashawn Rolon, et al. Robot=assisted versus open radical hysterectomy: a multi-onstitutional experience for early-stage cervical cancer. Eur J Surg Oncol. 2016;42:513-22.  4. Concepcion Elk, Harlin Heys, [place], [place], [place]. Surgical and oncologic outcomes after robotic radical hysterectomy as compared to open radical hysterectomy in the treatment of early cervical cancer. J Gynecol Oncol. 2017;28(6):e82.  5. Soliman PT, Frumovitz M, Sun CC, et al. Radical hysterectomy: a comparison of surgical approaches after adoption of robotic surgery in gynecologic oncology. Gynecol Oncol. 2011;123:333-336.iation   Everitt Amber, MD Gynecologic Oncologist 04/17/2018, 1:49 PM

## 2018-04-17 NOTE — Patient Instructions (Signed)
We are going to reach out to the radiologist to have them provide the measurements on the cervical tumor and we will contact you with that information along with Dr. Serita Grit recommendations for proceeding with surgery or chemo/radiation.                Preparing for your Surgery  Plan for surgery on April 28, 2018 with Dr. Everitt Amber at [place] will be scheduled for a robotic assisted radical hysterectomy, bilateral salpingo-oophorectomy, pelvic lymph node dissection.   Pre-operative Testing -You will receive a phone call from presurgical testing at [facility] to arrange for a pre-operative testing appointment before your surgery.  This appointment normally occurs one to two weeks before your scheduled surgery.   -Bring your insurance card, copy of an advanced directive if applicable, medication list  -At that visit, you will be asked to sign a consent for a possible blood transfusion in case a transfusion becomes necessary during surgery.  The need for a blood transfusion is rare but having consent is a necessary part of your care.     -You should not be taking blood thinners or aspirin at least ten days prior to surgery unless instructed by your surgeon.  Day Before Surgery at [place] will be asked to take in a light diet the day before surgery.  Avoid carbonated beverages.  You will be advised to have nothing to eat or drink after midnight the evening before.    Eat a light diet the day before surgery.  Examples including soups, broths, toast, yogurt, mashed potatoes.  Things to avoid include carbonated beverages (fizzy beverages), raw fruits and raw vegetables, or beans.   If your bowels are filled with gas, your surgeon will have difficulty visualizing your pelvic organs which increases your surgical risks.  Your role in recovery Your role is to become active as soon as directed by your doctor, while still giving yourself time to heal.  Rest when you feel  tired. You will be asked to do the following in order to speed your recovery:  - Cough and breathe deeply. This helps toclear and expand your lungs and can prevent pneumonia. You may be given a spirometer to practice deep breathing. A staff member will show you how to use the spirometer. - Do mild physical activity. Walking or moving your legs help your circulation and body functions return to normal. A staff member will help you when you try to walk and will provide you with simple exercises. Do not try to get up or walk alone the first time. - Actively manage your pain. Managing your pain lets you move in comfort. We will ask you to rate your pain on a scale of zero to 10. It is your responsibility to tell your doctor or nurse where and how much you hurt so your pain can be treated.  Special Considerations -If you are diabetic, you may be placed on insulin after surgery to have closer control over your blood sugars to promote healing and recovery.  This does not mean that you will be discharged on insulin.  If applicable, your oral antidiabetics will be resumed when you are tolerating a solid diet.  -Your final pathology results from surgery should be available around one week after surgery and the results will be relayed to you when available.  -Dr. Lahoma Crocker is the Surgeon that assists your GYN Oncologist with surgery.  The next day after your surgery you will  either see your GYN Oncologist, Dr. Precious Haws, or Dr. Lahoma Crocker.  -FMLA forms can be faxed to [phone] and please allow 5-7 business days for completion.   Blood Transfusion Information WHAT IS A BLOOD TRANSFUSION? A transfusion is the replacement of blood or some of its parts. Blood is made up of multiple cells which provide different functions.  Red blood cells carry oxygen and are used for blood loss replacement.  White blood cells fight against infection.  Platelets control bleeding.  Plasma helps  clot blood.  Other blood products are available for specialized needs, such as hemophilia or other clotting disorders. BEFORE THE TRANSFUSION  Who gives blood for transfusions?   You may be able to donate blood to be used at a later date on yourself (autologous donation).  Relatives can be asked to donate blood. This is generally not any safer than if you have received blood from a stranger. The same precautions are taken to ensure safety when a relative's blood is donated.  Healthy volunteers who are fully evaluated to make sure their blood is safe. This is blood bank blood. Transfusion therapy is the safest it has ever been in the practice of medicine. Before blood is taken from a donor, a complete history is taken to make sure that person has no history of diseases nor engages in risky social behavior (examples are intravenous drug use or sexual activity with multiple partners). The donor's travel history is screened to minimize risk of transmitting infections, such as malaria. The donated blood is tested for signs of infectious diseases, such as HIV and hepatitis. The blood is then tested to be sure it is compatible with you in order to minimize the chance of a transfusion reaction. If you or a relative donates blood, this is often done in anticipation of surgery and is not appropriate for emergency situations. It takes many days to process the donated blood. RISKS AND COMPLICATIONS Although transfusion therapy is very safe and saves many lives, the main dangers of transfusion include:   Getting an infectious disease.  Developing a transfusion reaction. This is an allergic reaction to something in the blood you were given. Every precaution is taken to prevent this. The decision to have a blood transfusion has been considered carefully by your caregiver before blood is given. Blood is not given unless the benefits outweigh the risks.

## 2018-04-17 NOTE — Progress Notes (Signed)
[place] at [facility]   Progress Note: Established Patient Follow-Up Visit   Consult was originally requested by Dr. Mora Bellman for cervical cancer  Chief Complaint  Patient presents with  . Malignant neoplasm of cervix, unspecified site [facility])    GYN Oncologic Summary 1. Stage IB2 SCCa Cervix o .  HPI: [name]  is a very nice 52 y.o.  P2  Interval History  She returns to discuss surgical planning as after her conversation with Dr Gerarda Fraction in November, she determined that she would like a minimally invasive approach. She presents with her husband and a Pharmacist, hospital.  PET/CT resulted with no apparent metastatic disease (though there was a mildly PET avid node in the neck felt to be unrelated). She had a 3.5cm area of increased avidity in the cervix.   Oncologic Course She went through menopause ~2017. On February 20, 2018 she noted post-menopausal bleeding that lasted ~ 2 weeks (intermittent). She presented to her PCP who referred her on to Dr. Elly Modena. On exam she was noted to have a bulky and friable cervix. A Pap and EMB were performed.   A TVUS was ordered and done 03/17/18 revealing a normal uterus with 61mm EM lining. No adnexal masses.  Pap and EMB revealed squamous cell CA.  She is thus referred for management and recommendations.  Her last pelvic/Pap was at the time of her last pregnancy ~16 years ago  Dr Gerarda Fraction took her to the operating room for an EUA and cervical biopsies due to intolerance of examination in the office. Findings as noted:  Anterior cervical lip with gross lesion from ~10-12:00. Remainder of cervix grossly normal. Estimate lesion to be <2cm. No vaginal or parametrial involvement.   Grossly the remainder of the cervix appeared normal. It was firm on palpation but no fungating lesions. Pathology confirmed what the Pap/EMB showed 1. Cervix, biopsy, 12 o'clock - INVASIVE MODERATELY DIFFERENTIATED  SQUAMOUS CELL CARCINOMA. SEE NOTE. 2. Cervix, biopsy, 3 o'clock - HIGH GRADE SQUAMOUS INTRAEPITHELIAL LESION (CIN-III, HIGH GRADE DYSPLASIA). SEE NOTE. 3. Cervix, biopsy, 9 o'clock - INVASIVE MODERATELY DIFFERENTIATED SQUAMOUS CELL CARCINOMA. SEE NOTE. 4. Cervix, biopsy, 6 o'clock - HIGH GRADE SQUAMOUS INTRAEPITHELIAL LESION (CIN-III, HIGH GRADE DYSPLASIA). SEE NOTE. Diagnosis Note 1. 2, 3 and 4. Dr. Melina Copa has reviewed this case and concurs with the above interpretation. (NDK:gt, 03/25/18) [name] MD   Dr Gerarda Fraction staged her clinically as a Stage IB1 (FIGO 2018) SCCa Cervix and ordered a PET/CT. Because Dr Gerarda Fraction stated that the tumor was <2cm, she counseled the patient that she was a candidate for either MIS or open approach. The patient elected for MIS approach and therefore elected to have surgery with Dr Denman George as Dr Gerarda Fraction does not perform radical hysterectomy via a MIS route.   Imported EPIC Oncologic History:   No history exists.    Measurement of disease: TBD . Marland Kitchen  Radiology: Ct Chest W Contrast  Result Date: 03/30/2018 CLINICAL DATA:  New diagnosis of cervical cancer. Staging. Postmenopausal bleeding starting 1 month ago. EXAM: CT CHEST, ABDOMEN, AND PELVIS WITH CONTRAST TECHNIQUE: Multidetector CT imaging of the chest, abdomen and pelvis was performed following the standard protocol during bolus administration of intravenous contrast. CONTRAST:  147mL OMNIPAQUE IOHEXOL 300 MG/ML  SOLN COMPARISON:  Pelvic ultrasound 03/17/2018. FINDINGS: CT CHEST FINDINGS Cardiovascular: Normal aortic caliber. Normal heart size, without pericardial effusion. No central pulmonary embolism, on this non-dedicated study. Mediastinum/Nodes: No supraclavicular adenopathy. No mediastinal or  hilar adenopathy. Lungs/Pleura: No pleural fluid.  Clear lungs. Musculoskeletal: No acute osseous abnormality. CT ABDOMEN PELVIS FINDINGS Hepatobiliary: 9 mm hypoattenuating right hepatic lobe lesion on image 58/2  is favored to represent a cyst. A central right hepatic lobe 3 mm lesion is too small to characterize but also most likely a cyst. Normal gallbladder, without biliary ductal dilatation. Pancreas: Normal, without mass or ductal dilatation. Spleen: Normal in size, without focal abnormality. Adrenals/Urinary Tract: Normal adrenal glands. Normal kidneys, without hydronephrosis. Normal urinary bladder. Stomach/Bowel: Normal stomach, without wall thickening. Normal colon, appendix, and terminal ileum. Normal small bowel. Vascular/Lymphatic: Normal caliber of the aorta and branch vessels. No abdominopelvic adenopathy. Reproductive: Suspect mild soft tissue fullness within the cervix. No gross parametrial extension. No adnexal mass. Other: No significant free fluid. An isolated low-density lesion lateral to the descending colon measures 2.2 x 2.2 cm on image 85/2. This has calcification in its posterior portion. Musculoskeletal: Sclerotic lesion in the right sacrum is likely a bone island. IMPRESSION: 1. Isolated low-density structure lateral to the descending colon. Differential considerations include isolated peritoneal implant/metastasis, GI duplication/mesenteric cyst, or postoperative collection such as seroma (clinical history only describes prior Caesarean section). Consider further evaluation with PET. 2. Otherwise, no evidence of metastatic disease in the chest, abdomen, or pelvis. Electronically Signed   By: Abigail Miyamoto M.D.   On: 03/30/2018 16:17   Ct Abdomen Pelvis W Contrast  Result Date: 03/30/2018 CLINICAL DATA:  New diagnosis of cervical cancer. Staging. Postmenopausal bleeding starting 1 month ago. EXAM: CT CHEST, ABDOMEN, AND PELVIS WITH CONTRAST TECHNIQUE: Multidetector CT imaging of the chest, abdomen and pelvis was performed following the standard protocol during bolus administration of intravenous contrast. CONTRAST:  145mL OMNIPAQUE IOHEXOL 300 MG/ML  SOLN COMPARISON:  Pelvic ultrasound  03/17/2018. FINDINGS: CT CHEST FINDINGS Cardiovascular: Normal aortic caliber. Normal heart size, without pericardial effusion. No central pulmonary embolism, on this non-dedicated study. Mediastinum/Nodes: No supraclavicular adenopathy. No mediastinal or hilar adenopathy. Lungs/Pleura: No pleural fluid.  Clear lungs. Musculoskeletal: No acute osseous abnormality. CT ABDOMEN PELVIS FINDINGS Hepatobiliary: 9 mm hypoattenuating right hepatic lobe lesion on image 58/2 is favored to represent a cyst. A central right hepatic lobe 3 mm lesion is too small to characterize but also most likely a cyst. Normal gallbladder, without biliary ductal dilatation. Pancreas: Normal, without mass or ductal dilatation. Spleen: Normal in size, without focal abnormality. Adrenals/Urinary Tract: Normal adrenal glands. Normal kidneys, without hydronephrosis. Normal urinary bladder. Stomach/Bowel: Normal stomach, without wall thickening. Normal colon, appendix, and terminal ileum. Normal small bowel. Vascular/Lymphatic: Normal caliber of the aorta and branch vessels. No abdominopelvic adenopathy. Reproductive: Suspect mild soft tissue fullness within the cervix. No gross parametrial extension. No adnexal mass. Other: No significant free fluid. An isolated low-density lesion lateral to the descending colon measures 2.2 x 2.2 cm on image 85/2. This has calcification in its posterior portion. Musculoskeletal: Sclerotic lesion in the right sacrum is likely a bone island. IMPRESSION: 1. Isolated low-density structure lateral to the descending colon. Differential considerations include isolated peritoneal implant/metastasis, GI duplication/mesenteric cyst, or postoperative collection such as seroma (clinical history only describes prior Caesarean section). Consider further evaluation with PET. 2. Otherwise, no evidence of metastatic disease in the chest, abdomen, or pelvis. Electronically Signed   By: Abigail Miyamoto M.D.   On: 03/30/2018 16:17    Nm Pet Image Initial (pi) Skull Base To Thigh  Addendum Date: 04/17/2018   ADDENDUM REPORT: 04/17/2018 11:01 ADDENDUM: The hypermetabolic cervical lesion measures approximately 3.5 x  2.5 cm. Electronically Signed   By: Marijo Sanes M.D.   On: 04/17/2018 11:01   Result Date: 04/17/2018 CLINICAL DATA:  Initial treatment strategy for cervical cancer. EXAM: NUCLEAR MEDICINE PET SKULL BASE TO THIGH TECHNIQUE: 6.6 mCi F-18 FDG was injected intravenously. Full-ring PET imaging was performed from the skull base to thigh after the radiotracer. CT data was obtained and used for attenuation correction and anatomic localization. Fasting blood glucose: 85 mg/dl COMPARISON:  CTs of the chest, abdomen and pelvis 03/30/2018 FINDINGS: Mediastinal blood pool activity: SUV max 2.7 NECK: There is a single hypermetabolic level 1B lymph node on the right, measuring 8 mm short axis on image 28/4 (SUV max 5.2). There are no other hypermetabolic cervical lymph nodes.There are no lesions of the pharyngeal mucosal space. Incidental CT findings: none CHEST: There are no hypermetabolic mediastinal, hilar or axillary lymph nodes. No suspicious pulmonary nodules. Incidental CT findings: none ABDOMEN/PELVIS: There is no hypermetabolic activity within the liver, adrenal glands, spleen or pancreas. There is no hypermetabolic nodal activity. There is focal hypermetabolic activity in the cervix (SUV max 11.2). There is no parametrial hypermetabolic activity. The fluid attenuation 2.4 cm collection posterior to the descending colon demonstrates no hypermetabolic activity. Incidental CT findings: No hydronephrosis. SKELETON: There is no hypermetabolic activity to suggest osseous metastatic disease. Incidental CT findings: Stable sclerotic lesion in the right sacrum, likely a bone island. IMPRESSION: 1. Prominent hypermetabolism within the cervix, consistent with known cervical cancer. No parametrial extension of tumor or abdominopelvic nodal  metastatic disease. 2. The previously demonstrated low-density structure posterior to the descending colon demonstrates no hypermetabolic activity and is likely an incidental duplication/mesenteric cyst. 3. Focal hypermetabolic activity within a single right neck lymph node (level 1B). This is unlikely to be related to the patient's cervical cancer. No hypermetabolism is seen within the pharyngeal mucosal space. ENT evaluation should be considered, especially if the patient has risk factors for head and neck malignancy. Electronically Signed: By: Richardean Sale M.D. On: 04/04/2018 12:23   US Pelvic Complete With Transvaginal  Result Date: 03/17/2018 CLINICAL DATA:  Patient with postmenopausal bleeding. EXAM: TRANSABDOMINAL AND TRANSVAGINAL ULTRASOUND OF PELVIS TECHNIQUE: Both transabdominal and transvaginal ultrasound examinations of the pelvis were performed. Transabdominal technique was performed for global imaging of the pelvis including uterus, ovaries, adnexal regions, and pelvic cul-de-sac. It was necessary to proceed with endovaginal exam following the transabdominal exam to visualize the endometrium. COMPARISON:  None FINDINGS: Uterus Measurements: 7.2 x 2.3 x 3.7 cm = volume: 32.4 mL. No fibroids or other mass visualized. Endometrium Thickness: 7 mm.  No focal abnormality visualized. Right ovary Not visualized. Left ovary Not visualized. Other findings No abnormal free fluid. IMPRESSION: Endometrium measures 7 mm. In the setting of post-menopausal bleeding, endometrial sampling is indicated to exclude carcinoma. If results are benign, sonohysterogram should be considered for focal lesion work-up. (Ref: Radiological Reasoning: Algorithmic Workup of Abnormal Vaginal Bleeding with Endovaginal Sonography and Sonohysterography. AJR 2008; [id number]:[id number]) Electronically Signed   By: Lovey Newcomer M.D.   On: 03/17/2018 15:24   .   Outpatient Encounter Medications as of 04/17/2018  Medication Sig  .  Cholecalciferol (VITAMIN D3 PO) Take by mouth as needed.   . Glucosamine-Chondroitin (MOVE FREE PO) Take by mouth.   No facility-administered encounter medications on file as of 04/17/2018.    No Known Allergies  Past Medical History:  Diagnosis Date  . Allergy   . Cervical cancer ([place])   . Frequent headaches   . PMB (  postmenopausal bleeding)   . Squamous cell carcinoma in situ    Past Surgical History:  Procedure Laterality Date  . CESAREAN SECTION     x 2  . ENDOMETRIAL BIOPSY  03/10/2018        Past Gynecological History:   GYNECOLOGIC HISTORY:  . Patient's last menstrual period was 02/20/2018. age 89 . Menarche: 52 years old . P 2 . Contraceptive condoms . HRT None  . Last Pap  Referral pap SCCa Family Hx:  Family History  Problem Relation Age of Onset  . Healthy Mother   . Hypertension Mother   . Healthy Father   . Cancer Neg Hx   . Heart disease Neg Hx    Social Hx:  Marland Kitchen Tobacco use: none . Alcohol use: none . Illicit Drug use: none . Illicit IV Drug use: none    Review of Systems: Review of Systems  Constitutional: Negative.   HENT:  Negative.   Eyes: Negative.   Respiratory: Negative.   Cardiovascular: Negative.   Gastrointestinal: Negative.   Endocrine: Negative.   Genitourinary: Negative.    Musculoskeletal: Negative.   Skin: Negative.   Neurological: Negative.   Hematological: Negative.   Psychiatric/Behavioral: Negative.   All other systems reviewed and are negative.  Vitals:  Vitals:   04/17/18 0935  BP: 116/68  Pulse: 80  Resp: 16  Temp: 98.1 F (36.7 C)  SpO2: 99%   Vitals:   04/17/18 0935  Weight: 137 lb (62.1 kg)   Body mass index is 24.27 kg/m.  Physical Exam: General :  Well developed, 52 y.o., female in no apparent distress HEENT:  Normocephalic/atraumatic, symmetric, EOMI, eyelids normal Neck:   No visible masses.  Respiratory:  Respirations unlabored, no use of accessory muscles CV:   Deferred Breast:    Deferred Musculoskeletal: Normal muscle strength. Abdomen:  No visible masses or protrusion Extremities:  No visible edema or deformities Skin:   Normal inspection Neuro/Psych:  No focal motor deficit, no abnormal mental status. Normal gait. Normal affect. Alert and oriented to person, place, and time  Genitourinary: 3cm friable ectocervical lesion on the anterior and right cervix. No vaginal extension, no parametrial extension palpable including on RV exam. Small mobile uterus.    Assessment  Stage IB2 SCCa Cervix  Plan  We reviewed the patient's diagnosis of early stage cervical cancer. On today's examination she is a clinical stage IB2 with a gross lesion estimated at 3.5cm. Treatment options include radical hysterectomy with pelvic lymphadenectomy or radiation. The risks and benefits of both were reviewed. I have emphasized that the cure rates for these two treatments are equivalent; however, I discussed that the toxicity profiles are different between these two modalities.  Risks of primary radiotherapy with sensitizing chemotherapy can include cystitis, proctitis, chronic rectal bleeding sigmoid stricture, small bowel obstruction, ureteral stricture, urinary or enteral fistula, loss of sexual function related to vaginal shortening and atrophy and menopause due to loss of ovarian function.  Surgery is associated with risks of bleeding, blood transfusion, injury to the bowel, bladder, ureter, urinary fistula, vaginal dehiscence, prolonged or permanent bladder dysfunction potentially requiring self-catheterization, lymphedema, infection, thromboembolic events, and possible effects on sexual function. We discussed that intraoperatively, the finding of previously unsuspected spread outside the cervix may lead to the surgery being aborted. Additionally, high or intermediate-risk factors on the final pathology report, such as positive lymph nodes, deep invasion, large tumor size and lymphatic  invasion, will likely lead to a recommendation of postoperative chemotherapy and radiation.  She desires to proceed with radical hysterectomy. Prior to surgery, a PET/CT has been/will be ordered to rule out any distant metastasis that would preclude the patient from being a surgical candidate.  Assuming that the patient is a surgical candidate following imaging, radical hysterectomy via a minimally invasive approach or abdominal procedure was discussed. We reviewed that prior to early 2018 our practice had been to routinely proceed with a robotic assisted procedure. We discussed that prospective data with endometrial cancer showed that outcomes from a minimally invasive procedure were not inferior as compared to one performed via laparotomy. We also discussed that retrospective data evaluating minimally invasive radical hysterectomy had not demonstrated worse outcomes as compared to an abdominal procedure. However, recent prospective data has suggested that there may be worse outcomes from a minimally invasive procedure. This data was published in November, 2018. A retrospective study from a [place] cancer database also seems to support this conclusion. We reviewed the results of these two studies.  Furthermore, we discussed that retrospective survival analysis of our practice from 2000-2017 demonstrates no increased risk for recurrence based on our minimally invasive radical technique. The pros and cons of both procedures were discussed in detail based on available published data. These include, but are not limited to, blood loss, injury to surrounding organs, lymphedema, need for further surgery, wound complications, VTE, and long-term bladder and bowel dysfunction.  After our discussion, the patient wishes to proceed with minimally invasive surgery. I did explain that Dr Gerarda Fraction had counseled her that her tumor was <2cm, but on my exam and on the radiologist's review, her lesion was >2cm. I discussed that risk for  recurrence was higher with tumors >2cm. I discussed the potential need for additional control with radiation for larger tumors.   The patient had already watched videos of cervical cancer MIS surgery and is very eager to proceed with a MIS approach.  Given that she was counseled about the potential increased risk for recurrence and offered an open approach I felt comfortable proceeding with this.   REFERENCES:  1. Rosendo Gros PT, Frumovitz M, Pareja R, et al. Minimally invasive versus abdominal radical hysterectomy for cervicalcancer. N Engl J Med. DOI: 80.1655/[id number].\cb3  2. Melamed A, Margul DJ, Chen L, et al. Survival after minimally invasive radical hysterectomy for early-stage cervical cancer. N Engl J Med. GBE:01.0071/[id number]  3. Sert BM, Boggess JF, Annalee Meyerhoff, et al. Robot=assisted versus open radical hysterectomy: a multi-onstitutional experience for early-stage cervical cancer. Eur J Surg Oncol. 2016;42:513-22.  4. Concepcion Elk, Harlin Heys, [place], [place], [place]. Surgical and oncologic outcomes after robotic radical hysterectomy as compared to open radical hysterectomy in the treatment of early cervical cancer. J Gynecol Oncol. 2017;28(6):e82.  5. Soliman PT, Frumovitz M, Sun CC, et al. Radical hysterectomy: a comparison of surgical approaches after adoption of robotic surgery in gynecologic oncology. Gynecol Oncol. 2011;123:333-336.iation   Everitt Amber, MD Gynecologic Oncologist 04/17/2018, 1:49 PM

## 2018-04-17 NOTE — Telephone Encounter (Signed)
Called Holly Pena and advised her that the mass on her cervix measures 3.5 cm so she can proceed with surgery on 04/28/18.  She verbalized understanding and agreement.

## 2018-04-20 NOTE — Patient Instructions (Signed)
Your procedure is scheduled on: Tuesday, Dec. 17, 2019   Surgery Time:  7:15AM-10:15AM   Report to [facility] Main  Entrance    Report to admitting at 5:30 AM   Call this number if you have problems the morning of surgery [phone]   Eat a light diet the day before surgery. Examples including soups, broths, toast, yogurt, mashed potatoes. Things to avoid include carbonated beverages (fizzy beverages), raw fruits and raw vegetables, or beans.    Do not eat food:After Midnight.   Only clear liquids after midnight until 4:30AM day of surgery  CLEAR LIQUID DIET  Foods Allowed                                                                     Foods Excluded  Coffee and tea, regular and decaf                             liquids that you cannot  Plain Jell-O in any flavor                                             see through such as: Fruit ices (not with fruit pulp)                                     milk, soups, orange juice  Iced Popsicles                                    All solid food                                    Cranberry, grape and apple juices Sports drinks like Gatorade Lightly seasoned clear broth or consume(fat free) Sugar, honey syrup  Sample Menu Breakfast                                Lunch                                     Supper Cranberry juice                    Beef broth                            Chicken broth Jell-O                                     Grape juice  Apple juice Coffee or tea                        Jell-O                                      Popsicle                                                Coffee or tea                        Coffee or tea   Brush your teeth the morning of surgery.   Do NOT smoke after Midnight   Complete one Ensure drink the morning of surgery at 4:30AM the day of surgery.   Take these medicines the morning of surgery with A SIP OF WATER: None                               You may not have any metal on your body including hair pins, jewelry, and body piercings             Do not wear make-up, lotions, powders, perfumes/cologne, or deodorant             Do not wear nail polish.  Do not shave  48 hours prior to surgery.               Do not bring valuables to the hospital. [place].   Contacts, dentures or bridgework may not be worn into surgery.   Leave suitcase in the car. After surgery it may be brought to your room.    Special Instructions: Bring a copy of your healthcare power of attorney and living will documents         the day of surgery if you haven't scanned them in before.              Please read over the following fact sheets you were given:  [facility] - Preparing for Surgery Before surgery, you can play an important role.  Because skin is not sterile, your skin needs to be as free of germs as possible.  You can reduce the number of germs on your skin by washing with CHG (chlorahexidine gluconate) soap before surgery.  CHG is an antiseptic cleaner which kills germs and bonds with the skin to continue killing germs even after washing. Please DO NOT use if you have an allergy to CHG or antibacterial soaps.  If your skin becomes reddened/irritated stop using the CHG and inform your nurse when you arrive at Short Stay. Do not shave (including legs and underarms) for at least 48 hours prior to the first CHG shower.  You may shave your face/neck.  Please follow these instructions carefully:  1.  Shower with CHG Soap the night before surgery and the  morning of surgery.  2.  If you choose to wash your hair, wash your hair first as usual with your normal  shampoo.  3.  After you shampoo, rinse your hair and body  thoroughly to remove the shampoo.                             4.  Use CHG as you would any other liquid soap.  You can apply chg directly to the skin and wash.  Gently with a scrungie or  clean washcloth.  5.  Apply the CHG Soap to your body ONLY FROM THE NECK DOWN.   Do   not use on face/ open                           Wound or open sores. Avoid contact with eyes, ears mouth and   genitals (private parts).                       Wash face,  Genitals (private parts) with your normal soap.             6.  Wash thoroughly, paying special attention to the area where your    surgery  will be performed.  7.  Thoroughly rinse your body with warm water from the neck down.  8.  DO NOT shower/wash with your normal soap after using and rinsing off the CHG Soap.                9.  Pat yourself dry with a clean towel.            10.  Wear clean pajamas.            11.  Place clean sheets on your bed the night of your first shower and do not  sleep with pets. Day of Surgery : Do not apply any lotions/deodorants the morning of surgery.  Please wear clean clothes to the hospital/surgery center.  FAILURE TO FOLLOW THESE INSTRUCTIONS MAY RESULT IN THE CANCELLATION OF YOUR SURGERY  PATIENT SIGNATURE_________________________________  NURSE SIGNATURE__________________________________  ________________________________________________________________________   Holly Pena  An incentive spirometer is a tool that can help keep your lungs clear and active. This tool measures how well you are filling your lungs with each breath. Taking long deep breaths may help reverse or decrease the chance of developing breathing (pulmonary) problems (especially infection) following:  A long period of time when you are unable to move or be active. BEFORE THE PROCEDURE   If the spirometer includes an indicator to show your best effort, your nurse or respiratory therapist will set it to a desired goal.  If possible, sit up straight or lean slightly forward. Try not to slouch.  Hold the incentive spirometer in an upright position. INSTRUCTIONS FOR USE  1. Sit on the edge of your bed if possible, or  sit up as far as you can in bed or on a chair. 2. Hold the incentive spirometer in an upright position. 3. Breathe out normally. 4. Place the mouthpiece in your mouth and seal your lips tightly around it. 5. Breathe in slowly and as deeply as possible, raising the piston or the ball toward the top of the column. 6. Hold your breath for 3-5 seconds or for as long as possible. Allow the piston or ball to fall to the bottom of the column. 7. Remove the mouthpiece from your mouth and breathe out normally. 8. Rest for a few seconds and repeat Steps 1 through 7 at least 10 times every 1-2 hours when you are awake. Take your  time and take a few normal breaths between deep breaths. 9. The spirometer may include an indicator to show your best effort. Use the indicator as a goal to work toward during each repetition. 10. After each set of 10 deep breaths, practice coughing to be sure your lungs are clear. If you have an incision (the cut made at the time of surgery), support your incision when coughing by placing a pillow or rolled up towels firmly against it. Once you are able to get out of bed, walk around indoors and cough well. You may stop using the incentive spirometer when instructed by your caregiver.  RISKS AND COMPLICATIONS  Take your time so you do not get dizzy or light-headed.  If you are in pain, you may need to take or ask for pain medication before doing incentive spirometry. It is harder to take a deep breath if you are having pain. AFTER USE  Rest and breathe slowly and easily.  It can be helpful to keep track of a log of your progress. Your caregiver can provide you with a simple table to help with this. If you are using the spirometer at home, follow these instructions: [place] IF:   You are having difficultly using the spirometer.  You have trouble using the spirometer as often as instructed.  Your pain medication is not giving enough relief while using the  spirometer.  You develop fever of 100.5 F (38.1 C) or higher. SEEK IMMEDIATE MEDICAL CARE IF:   You cough up bloody sputum that had not been present before.  You develop fever of 102 F (38.9 C) or greater.  You develop worsening pain at or near the incision site. MAKE SURE YOU:   Understand these instructions.  Will watch your condition.  Will get help right away if you are not doing well or get worse. Document Released: 09/09/2006 Document Revised: 07/22/2011 Document Reviewed: 11/10/2006 Exit[employer] Patient Information 2014 Exit[employer], [place].   ________________________________________________________________________  WHAT IS A BLOOD TRANSFUSION? Blood Transfusion Information  A transfusion is the replacement of blood or some of its parts. Blood is made up of multiple cells which provide different functions.  Red blood cells carry oxygen and are used for blood loss replacement.  White blood cells fight against infection.  Platelets control bleeding.  Plasma helps clot blood.  Other blood products are available for specialized needs, such as hemophilia or other clotting disorders. BEFORE THE TRANSFUSION  Who gives blood for transfusions?   Healthy volunteers who are fully evaluated to make sure their blood is safe. This is blood bank blood. Transfusion therapy is the safest it has ever been in the practice of medicine. Before blood is taken from a donor, a complete history is taken to make sure that person has no history of diseases nor engages in risky social behavior (examples are intravenous drug use or sexual activity with multiple partners). The donor's travel history is screened to minimize risk of transmitting infections, such as malaria. The donated blood is tested for signs of infectious diseases, such as HIV and hepatitis. The blood is then tested to be sure it is compatible with you in order to minimize the chance of a transfusion reaction. If you or a relative  donates blood, this is often done in anticipation of surgery and is not appropriate for emergency situations. It takes many days to process the donated blood. RISKS AND COMPLICATIONS Although transfusion therapy is very safe and saves many lives, the main dangers of  transfusion include:   Getting an infectious disease.  Developing a transfusion reaction. This is an allergic reaction to something in the blood you were given. Every precaution is taken to prevent this. The decision to have a blood transfusion has been considered carefully by your caregiver before blood is given. Blood is not given unless the benefits outweigh the risks. AFTER THE TRANSFUSION  Right after receiving a blood transfusion, you will usually feel much better and more energetic. This is especially true if your red blood cells have gotten low (anemic). The transfusion raises the level of the red blood cells which carry oxygen, and this usually causes an energy increase.  The nurse administering the transfusion will monitor you carefully for complications. HOME CARE INSTRUCTIONS  No special instructions are needed after a transfusion. You may find your energy is better. Speak with your caregiver about any limitations on activity for underlying diseases you may have. SEEK MEDICAL CARE IF:   Your condition is not improving after your transfusion.  You develop redness or irritation at the intravenous (IV) site. SEEK IMMEDIATE MEDICAL CARE IF:  Any of the following symptoms occur over the next 12 hours:  Shaking chills.  You have a temperature by mouth above 102 F (38.9 C), not controlled by medicine.  Chest, back, or muscle pain.  People around you feel you are not acting correctly or are confused.  Shortness of breath or difficulty breathing.  Dizziness and fainting.  You get a rash or develop hives.  You have a decrease in urine output.  Your urine turns a dark color or changes to pink, red, or brown. Any  of the following symptoms occur over the next 10 days:  You have a temperature by mouth above 102 F (38.9 C), not controlled by medicine.  Shortness of breath.  Weakness after normal activity.  The white part of the eye turns yellow (jaundice).  You have a decrease in the amount of urine or are urinating less often.  Your urine turns a dark color or changes to pink, red, or brown. Document Released: 04/26/2000 Document Revised: 07/22/2011 Document Reviewed: 12/14/2007 [facility] Patient Information 2014 [place], [place].  _______________________________________________________________________

## 2018-04-23 ENCOUNTER — Ambulatory Visit
Admission: RE | Admit: 2018-04-23 | Discharge: 2018-04-23 | Disposition: A | Discharge status: Home / Self Care | Payer: BLUE CROSS/BLUE SHIELD | Source: Ambulatory Visit | Attending: Obstetrics and Gynecology | Admitting: Obstetrics and Gynecology

## 2018-04-23 DIAGNOSIS — Z1239 Encounter for other screening for malignant neoplasm of breast: Secondary | ICD-10-CM

## 2018-04-24 ENCOUNTER — Encounter (HOSPITAL_COMMUNITY)
Admission: RE | Admit: 2018-04-24 | Discharge: 2018-04-24 | Disposition: A | Discharge status: Home / Self Care | Payer: BLUE CROSS/BLUE SHIELD | Source: Ambulatory Visit | Attending: Gynecologic Oncology | Admitting: Gynecologic Oncology

## 2018-04-24 ENCOUNTER — Encounter (HOSPITAL_COMMUNITY): Payer: Self-pay

## 2018-04-24 ENCOUNTER — Other Ambulatory Visit: Payer: Self-pay

## 2018-04-24 DIAGNOSIS — C539 Malignant neoplasm of cervix uteri, unspecified: Secondary | ICD-10-CM | POA: Insufficient documentation

## 2018-04-24 DIAGNOSIS — Z01818 Encounter for other preprocedural examination: Secondary | ICD-10-CM | POA: Diagnosis not present

## 2018-04-24 HISTORY — DX: Respiratory tuberculosis unspecified: A15.9

## 2018-04-24 HISTORY — DX: Anesthesia of skin: R20.0

## 2018-04-24 HISTORY — DX: Other seasonal allergic rhinitis: J30.2

## 2018-04-24 LAB — URINALYSIS, ROUTINE W REFLEX MICROSCOPIC
Bilirubin Urine: NEGATIVE
Glucose, UA: NEGATIVE mg/dL
Ketones, ur: NEGATIVE mg/dL
Nitrite: NEGATIVE
Protein, ur: NEGATIVE mg/dL
Specific Gravity, Urine: 1.015 (ref 1.005–1.030)
pH: 7 (ref 5.0–8.0)

## 2018-04-24 LAB — CBC
HCT: 41.3 % (ref 36.0–46.0)
Hemoglobin: 13.1 g/dL (ref 12.0–15.0)
MCH: 29.2 pg (ref 26.0–34.0)
MCHC: 31.7 g/dL (ref 30.0–36.0)
MCV: 92 fL (ref 80.0–100.0)
Platelets: 175 10*3/uL (ref 150–400)
RBC: 4.49 MIL/uL (ref 3.87–5.11)
RDW: 12.4 % (ref 11.5–15.5)
WBC: 4.4 10*3/uL (ref 4.0–10.5)
nRBC: 0 % (ref 0.0–0.2)

## 2018-04-24 LAB — COMPREHENSIVE METABOLIC PANEL
ALT: 14 U/L (ref 0–44)
AST: 21 U/L (ref 15–41)
Albumin: 4.4 g/dL (ref 3.5–5.0)
Alkaline Phosphatase: 52 U/L (ref 38–126)
Anion gap: 8 (ref 5–15)
BUN: 13 mg/dL (ref 6–20)
CHLORIDE: 106 mmol/L (ref 98–111)
CO2: 25 mmol/L (ref 22–32)
Calcium: 9.4 mg/dL (ref 8.9–10.3)
Creatinine, Ser: 0.68 mg/dL (ref 0.44–1.00)
GFR calc Af Amer: >60 mL/min (ref >60)
GFR calc non Af Amer: >60 mL/min (ref >60)
Glucose, Bld: 94 mg/dL (ref 70–99)
Potassium: 4 mmol/L (ref 3.5–5.1)
SODIUM: 139 mmol/L (ref 135–145)
Total Bilirubin: 0.8 mg/dL (ref 0.3–1.2)
Total Protein: 7 g/dL (ref 6.5–8.1)

## 2018-04-24 LAB — PREGNANCY, URINE: Preg Test, Ur: NEGATIVE

## 2018-04-24 LAB — ABO/RH: ABO/RH(D): B POS

## 2018-04-24 NOTE — Pre-Procedure Instructions (Signed)
UA results sent to Dr. Denman George and Jerilynn Mages. Cross, NP via epic.

## 2018-04-24 NOTE — Pre-Procedure Instructions (Signed)
[place] Interpreter at pre op appointment to translate when needed.

## 2018-04-24 NOTE — Pre-Procedure Instructions (Signed)
Left chart with Verena RN to follow up on urine culture results.

## 2018-04-25 LAB — URINE CULTURE: Culture: >=100000 — AB

## 2018-04-27 NOTE — Anesthesia Preprocedure Evaluation (Addendum)
Anesthesia Evaluation  Patient identified by MRN, date of birth, ID band Patient awake    Reviewed: Allergy & Precautions, NPO status , Patient's Chart, lab work & pertinent test results  History of Anesthesia Complications Negative for: history of anesthetic complications  Airway Mallampati: II  TM Distance: >3 FB Neck ROM: Full    Dental  (+) Teeth Intact, Dental Advisory Given,    Pulmonary neg pulmonary ROS,    Pulmonary exam normal        Cardiovascular negative cardio ROS Normal cardiovascular exam     Neuro/Psych  Headaches, negative psych ROS   GI/Hepatic negative GI ROS, Neg liver ROS,   Endo/Other  negative endocrine ROS  Renal/GU negative Renal ROS  negative genitourinary   Musculoskeletal negative musculoskeletal ROS (+)   Abdominal   Peds negative pediatric ROS (+)  Hematology negative hematology ROS (+)   Anesthesia Other Findings   Reproductive/Obstetrics                            Anesthesia Physical  Anesthesia Plan  ASA: II  Anesthesia Plan: General   Post-op Pain Management:    Induction: Intravenous  PONV Risk Score and Plan: 4 or greater and Ondansetron, Dexamethasone, Scopolamine patch - Pre-op and Diphenhydramine  Airway Management Planned: Oral ETT  Additional Equipment:   Intra-op Plan:   Post-operative Plan: Extubation in OR  Informed Consent: I have reviewed the patients History and Physical, chart, labs and discussed the procedure including the risks, benefits and alternatives for the proposed anesthesia with the patient or authorized representative who has indicated his/her understanding and acceptance.   Dental advisory given  Plan Discussed with: Anesthesiologist and CRNA  Anesthesia Plan Comments:        Anesthesia Quick Evaluation

## 2018-04-27 NOTE — Progress Notes (Signed)
Urine Culture result routed to Dr. Denman George for review.

## 2018-04-28 ENCOUNTER — Ambulatory Visit (HOSPITAL_COMMUNITY): Payer: BLUE CROSS/BLUE SHIELD | Admitting: Anesthesiology

## 2018-04-28 ENCOUNTER — Ambulatory Visit (HOSPITAL_COMMUNITY)
Admission: RE | Admit: 2018-04-28 | Discharge: 2018-04-29 | Disposition: A | Discharge status: Home / Self Care | Payer: BLUE CROSS/BLUE SHIELD | Attending: Gynecologic Oncology | Admitting: Gynecologic Oncology

## 2018-04-28 ENCOUNTER — Encounter (HOSPITAL_COMMUNITY)
Admission: RE | Disposition: A | Discharge status: Home / Self Care | Payer: Self-pay | Source: Home / Self Care | Attending: Gynecologic Oncology

## 2018-04-28 ENCOUNTER — Encounter (HOSPITAL_COMMUNITY): Payer: Self-pay | Admitting: *Deleted

## 2018-04-28 DIAGNOSIS — N950 Postmenopausal bleeding: Secondary | ICD-10-CM | POA: Insufficient documentation

## 2018-04-28 DIAGNOSIS — Z791 Long term (current) use of non-steroidal anti-inflammatories (NSAID): Secondary | ICD-10-CM | POA: Diagnosis not present

## 2018-04-28 DIAGNOSIS — N390 Urinary tract infection, site not specified: Secondary | ICD-10-CM | POA: Diagnosis not present

## 2018-04-28 DIAGNOSIS — C775 Secondary and unspecified malignant neoplasm of intrapelvic lymph nodes: Secondary | ICD-10-CM | POA: Insufficient documentation

## 2018-04-28 DIAGNOSIS — I959 Hypotension, unspecified: Secondary | ICD-10-CM

## 2018-04-28 DIAGNOSIS — C531 Malignant neoplasm of exocervix: Secondary | ICD-10-CM | POA: Diagnosis not present

## 2018-04-28 DIAGNOSIS — Z79899 Other long term (current) drug therapy: Secondary | ICD-10-CM | POA: Insufficient documentation

## 2018-04-28 DIAGNOSIS — C539 Malignant neoplasm of cervix uteri, unspecified: Secondary | ICD-10-CM | POA: Diagnosis not present

## 2018-04-28 DIAGNOSIS — N3000 Acute cystitis without hematuria: Secondary | ICD-10-CM

## 2018-04-28 HISTORY — PX: PELVIC LYMPH NODE DISSECTION: SHX6543

## 2018-04-28 HISTORY — PX: ROBOTIC ASSISTED TOTAL HYSTERECTOMY: SHX6085

## 2018-04-28 HISTORY — PX: SALPINGOOPHORECTOMY: SHX82

## 2018-04-28 LAB — TYPE AND SCREEN
ABO/RH(D): B POS
ANTIBODY SCREEN: NEGATIVE

## 2018-04-28 SURGERY — HYSTERECTOMY, TOTAL, ROBOT-ASSISTED
Anesthesia: General

## 2018-04-28 MED ORDER — LIDOCAINE 2% (20 MG/ML) 5 ML SYRINGE
INTRAMUSCULAR | Status: DC | PRN
  Administered 2018-04-28 (×2): 50 mg via INTRAVENOUS

## 2018-04-28 MED ORDER — EPHEDRINE 5 MG/ML INJ
INTRAVENOUS | Status: AC
  Filled 2018-04-28: qty 10

## 2018-04-28 MED ORDER — FENTANYL CITRATE (PF) 250 MCG/5ML IJ SOLN
INTRAMUSCULAR | Status: AC
  Filled 2018-04-28: qty 5

## 2018-04-28 MED ORDER — MIDAZOLAM HCL 5 MG/5ML IJ SOLN
INTRAMUSCULAR | Status: DC | PRN
  Administered 2018-04-28 (×2): 1 mg via INTRAVENOUS

## 2018-04-28 MED ORDER — EPHEDRINE SULFATE 50 MG/ML IJ SOLN
INTRAMUSCULAR | Status: DC | PRN
  Administered 2018-04-28: 10 mg via INTRAVENOUS

## 2018-04-28 MED ORDER — ENOXAPARIN SODIUM 40 MG/0.4ML ~~LOC~~ SOLN
40.0000 mg | SUBCUTANEOUS | Status: DC
  Administered 2018-04-29: 40 mg via SUBCUTANEOUS
  Filled 2018-04-28: qty 0.4

## 2018-04-28 MED ORDER — OXYCODONE HCL 5 MG PO TABS: 5.0000 mg | ORAL_TABLET | ORAL | Status: DC | PRN

## 2018-04-28 MED ORDER — LACTATED RINGERS IV SOLN
INTRAVENOUS | Status: DC
  Administered 2018-04-28 (×2): via INTRAVENOUS

## 2018-04-28 MED ORDER — ROCURONIUM BROMIDE 10 MG/ML (PF) SYRINGE
PREFILLED_SYRINGE | INTRAVENOUS | Status: AC
  Filled 2018-04-28: qty 10

## 2018-04-28 MED ORDER — MIDAZOLAM HCL 2 MG/2ML IJ SOLN
INTRAMUSCULAR | Status: AC
  Filled 2018-04-28: qty 2

## 2018-04-28 MED ORDER — ROCURONIUM BROMIDE 50 MG/5ML IV SOSY
PREFILLED_SYRINGE | INTRAVENOUS | Status: DC | PRN
  Administered 2018-04-28: 5 mg via INTRAVENOUS
  Administered 2018-04-28: 50 mg via INTRAVENOUS
  Administered 2018-04-28: 20 mg via INTRAVENOUS
  Administered 2018-04-28: 10 mg via INTRAVENOUS
  Administered 2018-04-28: 20 mg via INTRAVENOUS

## 2018-04-28 MED ORDER — LACTATED RINGERS IR SOLN
Status: DC | PRN
  Administered 2018-04-28: 1000 mL

## 2018-04-28 MED ORDER — ONDANSETRON HCL 4 MG/2ML IJ SOLN: 4.0000 mg | Freq: Four times a day (QID) | INTRAMUSCULAR | Status: DC | PRN

## 2018-04-28 MED ORDER — HYDROMORPHONE HCL 1 MG/ML IJ SOLN: 0.5000 mg | INTRAMUSCULAR | Status: DC | PRN

## 2018-04-28 MED ORDER — ACETAMINOPHEN 500 MG PO TABS
1000.0000 mg | ORAL_TABLET | ORAL | Status: AC
  Administered 2018-04-28: 1000 mg via ORAL
  Filled 2018-04-28: qty 2

## 2018-04-28 MED ORDER — KETOROLAC TROMETHAMINE 15 MG/ML IJ SOLN
INTRAMUSCULAR | Status: AC
  Filled 2018-04-28: qty 1

## 2018-04-28 MED ORDER — SCOPOLAMINE 1 MG/3DAYS TD PT72
1.0000 | MEDICATED_PATCH | TRANSDERMAL | Status: DC
  Administered 2018-04-28: 1.5 mg via TRANSDERMAL
  Filled 2018-04-28: qty 1

## 2018-04-28 MED ORDER — ONDANSETRON HCL 4 MG/2ML IJ SOLN
INTRAMUSCULAR | Status: AC
  Filled 2018-04-28: qty 2

## 2018-04-28 MED ORDER — PROPOFOL 10 MG/ML IV BOLUS
INTRAVENOUS | Status: AC
  Filled 2018-04-28: qty 20

## 2018-04-28 MED ORDER — DEXAMETHASONE SODIUM PHOSPHATE 4 MG/ML IJ SOLN
4.0000 mg | INTRAMUSCULAR | Status: AC
  Administered 2018-04-28: 10 mg via INTRAVENOUS

## 2018-04-28 MED ORDER — IBUPROFEN 200 MG PO TABS: 600.0000 mg | ORAL_TABLET | Freq: Four times a day (QID) | ORAL | Status: DC

## 2018-04-28 MED ORDER — ONDANSETRON HCL 4 MG PO TABS: 4.0000 mg | ORAL_TABLET | Freq: Four times a day (QID) | ORAL | Status: DC | PRN

## 2018-04-28 MED ORDER — SCOPOLAMINE 1 MG/3DAYS TD PT72: 1.0000 | MEDICATED_PATCH | TRANSDERMAL | Status: DC

## 2018-04-28 MED ORDER — DIPHENHYDRAMINE HCL 50 MG/ML IJ SOLN
INTRAMUSCULAR | Status: AC
  Filled 2018-04-28: qty 1

## 2018-04-28 MED ORDER — ENOXAPARIN SODIUM 40 MG/0.4ML ~~LOC~~ SOLN
40.0000 mg | SUBCUTANEOUS | Status: AC
  Administered 2018-04-28: 40 mg via SUBCUTANEOUS
  Filled 2018-04-28: qty 0.4

## 2018-04-28 MED ORDER — KETOROLAC TROMETHAMINE 30 MG/ML IJ SOLN
15.0000 mg | Freq: Once | INTRAMUSCULAR | Status: AC
  Administered 2018-04-28: 15 mg via INTRAVENOUS

## 2018-04-28 MED ORDER — HYDROMORPHONE HCL 1 MG/ML IJ SOLN
INTRAMUSCULAR | Status: AC
  Administered 2018-04-28: 0.25 mg via INTRAVENOUS
  Filled 2018-04-28: qty 1

## 2018-04-28 MED ORDER — ACETAMINOPHEN 500 MG PO TABS
1000.0000 mg | ORAL_TABLET | Freq: Four times a day (QID) | ORAL | Status: DC
  Administered 2018-04-28 – 2018-04-29 (×4): 1000 mg via ORAL
  Filled 2018-04-28 (×4): qty 2

## 2018-04-28 MED ORDER — KCL IN DEXTROSE-NACL 20-5-0.45 MEQ/L-%-% IV SOLN
INTRAVENOUS | Status: DC
  Administered 2018-04-28: 22:00:00 via INTRAVENOUS
  Filled 2018-04-28 (×2): qty 1000

## 2018-04-28 MED ORDER — ONDANSETRON HCL 4 MG/2ML IJ SOLN
INTRAMUSCULAR | Status: DC | PRN
  Administered 2018-04-28: 4 mg via INTRAVENOUS

## 2018-04-28 MED ORDER — GABAPENTIN 300 MG PO CAPS
300.0000 mg | ORAL_CAPSULE | ORAL | Status: AC
  Administered 2018-04-28: 300 mg via ORAL
  Filled 2018-04-28: qty 1

## 2018-04-28 MED ORDER — FENTANYL CITRATE (PF) 100 MCG/2ML IJ SOLN
INTRAMUSCULAR | Status: DC | PRN
  Administered 2018-04-28 (×5): 50 ug via INTRAVENOUS

## 2018-04-28 MED ORDER — STERILE WATER FOR IRRIGATION IR SOLN
Status: DC | PRN
  Administered 2018-04-28: 1000 mL

## 2018-04-28 MED ORDER — DEXAMETHASONE SODIUM PHOSPHATE 10 MG/ML IJ SOLN
INTRAMUSCULAR | Status: AC
  Filled 2018-04-28: qty 1

## 2018-04-28 MED ORDER — SENNOSIDES-DOCUSATE SODIUM 8.6-50 MG PO TABS
2.0000 | ORAL_TABLET | Freq: Every day | ORAL | Status: DC
  Administered 2018-04-28: 2 via ORAL
  Filled 2018-04-28: qty 2

## 2018-04-28 MED ORDER — CEFAZOLIN SODIUM-DEXTROSE 2-4 GM/100ML-% IV SOLN
2.0000 g | INTRAVENOUS | Status: AC
  Administered 2018-04-28: 2 g via INTRAVENOUS
  Filled 2018-04-28: qty 100

## 2018-04-28 MED ORDER — PROPOFOL 10 MG/ML IV BOLUS
INTRAVENOUS | Status: DC | PRN
  Administered 2018-04-28: 110 mg via INTRAVENOUS
  Administered 2018-04-28: 50 mg via INTRAVENOUS

## 2018-04-28 MED ORDER — HYDROMORPHONE HCL 1 MG/ML IJ SOLN
0.2500 mg | INTRAMUSCULAR | Status: DC | PRN
  Administered 2018-04-28 (×2): 0.25 mg via INTRAVENOUS

## 2018-04-28 MED ORDER — PROMETHAZINE HCL 25 MG/ML IJ SOLN: 6.2500 mg | INTRAMUSCULAR | Status: DC | PRN

## 2018-04-28 MED ORDER — KETOROLAC TROMETHAMINE 15 MG/ML IJ SOLN
15.0000 mg | Freq: Four times a day (QID) | INTRAMUSCULAR | Status: AC
  Administered 2018-04-28 – 2018-04-29 (×4): 15 mg via INTRAVENOUS
  Filled 2018-04-28 (×4): qty 1

## 2018-04-28 MED ORDER — SUGAMMADEX SODIUM 200 MG/2ML IV SOLN
INTRAVENOUS | Status: DC | PRN
  Administered 2018-04-28: 150 mg via INTRAVENOUS

## 2018-04-28 MED ORDER — GABAPENTIN 300 MG PO CAPS
300.0000 mg | ORAL_CAPSULE | Freq: Three times a day (TID) | ORAL | Status: DC
  Administered 2018-04-28 – 2018-04-29 (×3): 300 mg via ORAL
  Filled 2018-04-28 (×3): qty 1

## 2018-04-28 MED ORDER — LIDOCAINE 2% (20 MG/ML) 5 ML SYRINGE
INTRAMUSCULAR | Status: AC
  Filled 2018-04-28: qty 5

## 2018-04-28 MED ORDER — CELECOXIB 200 MG PO CAPS
400.0000 mg | ORAL_CAPSULE | ORAL | Status: AC
  Administered 2018-04-28: 400 mg via ORAL
  Filled 2018-04-28: qty 2

## 2018-04-28 SURGICAL SUPPLY — 48 items
APPLICATOR SURGIFLO ENDO (HEMOSTASIS) IMPLANT
BAG LAPAROSCOPIC 12 15 PORT 16 (BASKET) IMPLANT
BAG RETRIEVAL 12/15 (BASKET)
CHLORAPREP W/TINT 26ML (MISCELLANEOUS) ×3 IMPLANT
COVER BACK TABLE 60X90IN (DRAPES) ×3 IMPLANT
COVER TIP SHEARS 8 DVNC (MISCELLANEOUS) ×2 IMPLANT
COVER TIP SHEARS 8MM DA VINCI (MISCELLANEOUS) ×1
COVER WAND RF STERILE (DRAPES) ×3 IMPLANT
DERMABOND ADVANCED (GAUZE/BANDAGES/DRESSINGS) ×1
DERMABOND ADVANCED .7 DNX12 (GAUZE/BANDAGES/DRESSINGS) ×2 IMPLANT
DRAPE ARM DVNC X/XI (DISPOSABLE) ×8 IMPLANT
DRAPE COLUMN DVNC XI (DISPOSABLE) ×2 IMPLANT
DRAPE DA VINCI XI ARM (DISPOSABLE) ×4
DRAPE DA VINCI XI COLUMN (DISPOSABLE) ×1
DRAPE SHEET LG 3/4 BI-LAMINATE (DRAPES) ×3 IMPLANT
DRAPE SURG IRRIG POUCH 19X23 (DRAPES) ×3 IMPLANT
ELECT REM PT RETURN 15FT ADLT (MISCELLANEOUS) ×3 IMPLANT
GLOVE BIO SURGEON STRL SZ 6 (GLOVE) ×12 IMPLANT
GLOVE BIO SURGEON STRL SZ 6.5 (GLOVE) ×6 IMPLANT
GOWN STRL REUS W/ TWL LRG LVL3 (GOWN DISPOSABLE) ×4 IMPLANT
GOWN STRL REUS W/TWL LRG LVL3 (GOWN DISPOSABLE) ×2
HOLDER FOLEY CATH W/STRAP (MISCELLANEOUS) ×3 IMPLANT
IRRIG SUCT STRYKERFLOW 2 WTIP (MISCELLANEOUS) ×3
IRRIGATION SUCT STRKRFLW 2 WTP (MISCELLANEOUS) ×2 IMPLANT
KIT PROCEDURE DA VINCI SI (MISCELLANEOUS)
KIT PROCEDURE DVNC SI (MISCELLANEOUS) IMPLANT
MANIPULATOR UTERINE 4.5 ZUMI (MISCELLANEOUS) IMPLANT
NEEDLE SPNL 18GX3.5 QUINCKE PK (NEEDLE) IMPLANT
OBTURATOR OPTICAL STANDARD 8MM (TROCAR) ×1
OBTURATOR OPTICAL STND 8 DVNC (TROCAR) ×2
OBTURATOR OPTICALSTD 8 DVNC (TROCAR) ×2 IMPLANT
PACK ROBOT GYN CUSTOM WL (TRAY / TRAY PROCEDURE) ×3 IMPLANT
PAD POSITIONING PINK XL (MISCELLANEOUS) ×3 IMPLANT
PORT ACCESS TROCAR AIRSEAL 12 (TROCAR) ×2 IMPLANT
PORT ACCESS TROCAR AIRSEAL 5M (TROCAR) ×1
POUCH SPECIMEN RETRIEVAL 10MM (ENDOMECHANICALS) ×6 IMPLANT
SEAL CANN UNIV 5-8 DVNC XI (MISCELLANEOUS) ×8 IMPLANT
SEAL XI 5MM-8MM UNIVERSAL (MISCELLANEOUS) ×4
SET TRI-LUMEN FLTR TB AIRSEAL (TUBING) ×3 IMPLANT
SURGIFLO W/THROMBIN 8M KIT (HEMOSTASIS) IMPLANT
SUT MNCRL AB 4-0 PS2 18 (SUTURE) ×9 IMPLANT
SUT VIC AB 0 CT1 27 (SUTURE) ×1
SUT VIC AB 0 CT1 27XBRD ANTBC (SUTURE) ×2 IMPLANT
SYR 10ML LL (SYRINGE) IMPLANT
TOWEL OR NON WOVEN STRL DISP B (DISPOSABLE) ×3 IMPLANT
TRAP SPECIMEN MUCOUS 40CC (MISCELLANEOUS) IMPLANT
TRAY FOLEY MTR SLVR 16FR STAT (SET/KITS/TRAYS/PACK) ×3 IMPLANT
UNDERPAD 30X30 (UNDERPADS AND DIAPERS) ×3 IMPLANT

## 2018-04-28 NOTE — Anesthesia Postprocedure Evaluation (Signed)
Anesthesia Post Note  Patient: Holly Pena  Procedure(s) Performed: XI ROBOTIC ASSISTED RADICAL  HYSTERECTOMY (N/A ) BIALTERAL SALPINGO OOPHORECTOMY (Bilateral ) PELVIC LYMPH NODE DISSECTION (N/A )     Patient location during evaluation: PACU Anesthesia Type: General Level of consciousness: sedated Pain management: pain level controlled Vital Signs Assessment: post-procedure vital signs reviewed and stable Respiratory status: spontaneous breathing and respiratory function stable Cardiovascular status: stable Postop Assessment: no apparent nausea or vomiting Anesthetic complications: no    Last Vitals:  Vitals:   04/28/18 1230 04/28/18 1245  BP: 118/76 111/71  Pulse: 61 (!) 57  Resp: 13 12  Temp:  36.6 C  SpO2: 100% 100%    Last Pain:  Vitals:   04/28/18 1245  TempSrc:   PainSc: [place]

## 2018-04-28 NOTE — Op Note (Addendum)
OPERATIVE NOTE 04/28/18  Surgeon: Donaciano Eva   Assistants: Lahoma Crocker (an MD assistant was necessary for tissue manipulation, management of robotic instrumentation, retraction and positioning due to the complexity of the case and hospital policies).   Anesthesia: General endotracheal anesthesia  ASA Class: 3   Pre-operative Diagnosis: stage IB2 cervical cancer  Post-operative Diagnosis: same  Operation: Robotic-assisted type III radical laparoscopic hysterectomy with bilateral salpingectomy and bilateral pelvic lymphadenectomy  Surgeon: Donaciano Eva  Assistant Surgeon: Lahoma Crocker MD  Anesthesia: GET  Urine Output: 300cc  Operative Findings:  : 3.5cm tumor replacing the anterior and right cervix. No gross parametrial involvement.  Estimated Blood Loss:  less than 50 mL      Total IV Fluids: 1,000 ml         Specimens: left and right pelvic nodes, uterus with cervix, bilateral tubes and upper vagina. Anterior vaginal margin with marking stitch at 12 o'clock of new true distal anterior vaginal margin         Complications:  None; patient tolerated the procedure well.         Disposition: PACU - hemodynamically stable.  Procedure Details  The patient was seen in the Holding Room. The risks, benefits, complications, treatment options, and expected outcomes were discussed with the patient.  The patient concurred with the proposed plan, giving informed consent.  The site of surgery properly noted/marked. The patient was identified as Holly Pena and the procedure verified as a Robotic-assisted radical hysterectomy with bilateral salpingectomy and bilateral pelvic lymphadenectomy. A Time Out was held and the above information confirmed.  After induction of anesthesia, the patient was draped and prepped in the usual sterile manner. Pt was placed in supine position after anesthesia and draped and prepped in the usual sterile manner. The abdominal drape was  placed after the CholoraPrep had been allowed to dry for 3 minutes.  Her arms were tucked to her side with all appropriate precautions.  The chest was secured to the table.  The patient was placed in the semi-lithotomy position in [employer].  The perineum was prepped with Betadine.  Foley catheter was placed.  An EEA sizer (large size) was placed vaginally to define the vaginal fornices.  A second time-out was performed.  OG tube placement was confirmed and to suction.   Procedure:  The patient was brought to the operating room where general anesthesia was administered with no complications.  The patient was placed in the dorsal lithotomy position in padded Allen stirrups.  The arms were tucked at the sides with gel pads protecting the elbows and foam protecting the hands. The patient was then prepped.  A Foley was placed to gravity. An EEA sizer was placed in the vaginal fornices.  The patient was then draped in the normal manner.  Next, a 5 mm skin incision was made 1 cm below the subcostal margin in the midclavicular line.  The 5 mm Optiview port and scope was used for direct entry.  Opening pressure was under 10 mm CO2.  The abdomen was insufflated and the findings were noted as above.   At this point and all points during the procedure, the patient's intra-abdominal pressure did not exceed 15 mmHg. Next, a 10 mm skin incision was made at the umbilicus and a right and left port was placed about 10 cm lateral to the robot port on the right and left side.  A fourth arm was placed in the left lower quadrant 2 cm above  and superior and medial to the anterior superior iliac spine.  All ports were placed under direct visualization.  The patient was placed in steep Trendelenburg.  Bowel was away into the upper abdomen.  The robot was docked in the normal manner.  The right retroperitoneum in the pelvis was opened parallel to the IP ligament. The right paravesical space was developed with monopolar and sharp  dissection. It was held open with tension on the median umbilical ligament with the forth arm. The pararectal space was opened with blunt and sharp dissection to mobilize the ureter off of the medial surface of the internal iliac artery. The medial leaf of the broad ligament containing the ureter was held medially (opening the pararectal space) by the assistant's grasper. The right pelvic lymphadenectomy was performed by skeletonizing the internal iliac artery at the bifurcation with the external iliac artery. The obturator nerve was identified in the base of lateral paravesical space. The ureter was mobilized medially off of the dissection by developing the pararectal space. The genitofemoral nerve was identified, skeletonized and mobilized laterally off of the external iliac artery. An enbloc resection of lymph nodes was performed within the following boundaries: the mid portion of the common iliac proximally, the circumflex iliac vein distally, the obturator nerve posteriorally, the genitofemoral nerve laterally. The nodal basin (including obturator space) were confirmed to be empty of nodes and hemostatic. The nodes were placed in an endocatch bag and retrieved at the end of the procedure vaginally.  The same procedure with the same steps was performed on the patient's left side. The left retroperitoneum in the pelvis was opened parallel to the IP ligament. The left paravesical space was developed with monopolar and sharp dissection. It was held open with tension on the median umbilical ligament with the forth arm. The pararectal space was opened with blunt and sharp dissection to mobilize the ureter off of the medial surface of the internal iliac artery. The medial leaf of the broad ligament containing the ureter was held medially (opening the pararectal space) by the assistant's grasper. The left pelvic lymphadenectomy was performed by skeletonizing the internal iliac artery at the bifurcation with the  external iliac artery. The obturator nerve was identified in the base of lateral paravesical space. The ureter was mobilized medially off of the dissection by developing the pararectal space. The genitofemoral nerve was identified, skeletonized and mobilized laterally off of the external iliac artery. An enbloc resection of lymph nodes was performed within the following boundaries: the mid portion of the common iliac proximally, the circumflex iliac vein distally, the obturator nerve posteriorally, the genitofemoral nerve laterally. The nodal basin (including obturator space) were confirmed to be empty of nodes and hemostatic. The nodes were placed in an endocatch bag and retrieved at the end of the procedure vaginally.  The radical hysterectomy was begun by first skeletonizing the right uterine artery at its origin from the internal iliac artery by skeletonizing it 360 degrees and defining the parauterine web between the paravesical and pararectal spaces. The right ureter was skeletonized off of its attachments to the broad ligament and mobilized laterally. Using meticulous blunt and sparing monopolar dissection in short controlled bursts, the right ureter was untunnelled from under the right uterine artery. The anterior vessicouterine ligament was developed and the bladder flap was taken down to below the level of the tumor and below the rounded portion of the EEA sizer. This then definied the anterior bladder pillar. The uterine artery was bipolar fulgarated to seal  it, then transected. The uterine vein was also sealed and resected. The lateral boundary of the parametrium was taken down with biploar and monopolar dissection to the level of the deep vaginal vein. The uterine vessels were retracted superior and medially over the ureter on the right. The ureter was untunnelled through to its entry into the bladder with meticulous sharp dissection and short bursts of monopolar energy. The ureter was dissected off  of its attachments to the anterior vagina. The anterior bladder pillar was skeletonized and sealed with bipolar energy while the ureter was deflected distally. The posterior bladder pillar was also dissected with monopolar scissors from the upper vagina.  The rectovaginal septum was entered posteriorally with sharp dissection and the rectum was dissected off of the vagina. A window was created in the broad ligament with care to mobilize the ureter laterally. This skeletonized the utero-ovarian ligament which was sealed with bipolar energy and transected. The fallopian tube was dissected off of the ovary and kept in tact with the uterus.The right uterosacral ligament was transected with bipolar and monopolar energy 1/2 to 2/3rds of the way towards the uterosacral ligament insertion into the sacrum. In doing so meticulous attention was made to identify and wherever possible, spare the hypogastric nerves. The right paravaginal tissues were tubularized around the vagina on the right at the inferior boundary of the dissection.  The left radical hysterectomy was then performed by first skeletonizing the left uterine artery at its origin from the internal iliac artery by skeletonizing it 360 degrees and defining the parauterine web between the paravesical and pararectal spaces. The left ureter was skeletonized off of its attachments to the broad ligament and mobilized laterally. Using meticulous blunt and sparing monopolar dissection in short controlled bursts, the left ureter was untunnelled from under the left uterine artery. The left anterior vessicouterine ligament was developed and the bladder flap was taken down to below the level of the tumor and below the rounded portion of the EEA sizer. This then definied the anterior bladder pillar. The uterine artery was bipolar fulgarated to seal it, then transected. The uterine vein was also sealed and resected. The lateral boundary of the parametrium was taken down with  biploar and monopolar dissection to the level of the deep vaginal vein. The uterine vessels were retracted superior and medially over the ureter on the right. The ureter was untunnelled through to its entry into the bladder with meticulous sharp dissection and short bursts of monopolar energy. The ureter was dissected off of its attachments to the anterior vagina. The anterior bladder pillar was skeletonized and sealed with bipolar energy while the ureter was deflected distally. The posterior bladder pillar was also dissected with monopolar scissors from the upper vagina.  The rectovaginal septum was entered posteriorally with sharp dissection and the rectum was dissected off of the vagina. A window was created in the broad ligament with care to mobilize the ureter laterally. This skeletonized the utero-ovarian ligament which was sealed with bipolar energy and transected. The fallopian tube was dissected off of the ovary and remained in tact with the uterine specimen. The left uterosacral ligament was transected with bipolar and monopolar energy 1/2 to 2/3rds of the way towards the uterosacral ligament insertion into the sacrum. In doing so meticulous attention was made to identify and wherever possible, spare the hypogastric nerves. The left paravaginal tissues were tubularized around the vagina on the left at the inferior boundary of the dissection.  The colpotomy was made and the uterus,  cervix, bilateral tubes were amputated and delivered through the vagina.  Pedicles were inspected and excellent hemostasis was achieved.    The specimen was inspected for adequacy of margins. The anterior margin seemed at most 1cm from the tumor and therefore an additional 1-2cm of anterior vaginal margin was taken with a marking stitch placed at 12 o'clock (anterior midline) of the distal vaginal margin.  The colpotomy at the vaginal cuff was closed with two 0-Vicryl on a CT1 needle in a running manner.  Irrigation was  used and excellent hemostasis was achieved.  At this point in the procedure was completed.  Robotic instruments were removed under direct visulaization.  The robot was undocked. The 10 mm ports were closed with Vicryl on a UR-5 needle and the fascia was closed with 0 Vicryl on a UR-5 needle.  The skin was closed with 4-0 Vicryl in a subcuticular manner.  Dermabond was applied.  Sponge, lap and needle counts correct x 2.  The patient was taken to the recovery room in stable condition.  The vagina was swabbed with  minimal bleeding noted.   All instrument and needle counts were correct x  3.   The patient was transferred to the recovery room in a stable condition.  Donaciano Eva, MD

## 2018-04-28 NOTE — Anesthesia Procedure Notes (Signed)
Procedure Name: Intubation Date/Time: 04/28/2018 7:40 AM Performed by: Deliah Boston, CRNA Pre-anesthesia Checklist: Patient identified, Emergency Drugs available, Suction available and Patient being monitored Patient Re-evaluated:Patient Re-evaluated prior to induction Oxygen Delivery Method: Circle system utilized Preoxygenation: Pre-oxygenation with 100% oxygen Induction Type: IV induction Ventilation: Mask ventilation without difficulty Laryngoscope Size: Mac and 3 Grade View: Grade I Tube type: Oral Tube size: 7.0 mm Number of attempts: 1 Airway Equipment and Method: Stylet and Oral airway Placement Confirmation: ETT inserted through vocal cords under direct vision,  positive ETCO2 and breath sounds checked- equal and bilateral Secured at: 21 cm Tube secured with: Tape Dental Injury: Teeth and Oropharynx as per pre-operative assessment

## 2018-04-28 NOTE — Transfer of Care (Signed)
Immediate Anesthesia Transfer of Care Note  Patient: Holly Pena  Procedure(s) Performed: Procedure(s): XI ROBOTIC ASSISTED RADICAL  HYSTERECTOMY (N/A) BIALTERAL SALPINGO OOPHORECTOMY (Bilateral) PELVIC LYMPH NODE DISSECTION (N/A)  Patient Location: PACU  Anesthesia Type:General  Level of Consciousness: Patient easily awoken, sedated, comfortable, cooperative, following commands, responds to stimulation.   Airway & Oxygen Therapy: Patient spontaneously breathing, ventilating well, oxygen via simple oxygen mask.  Post-op Assessment: Report given to PACU RN, vital signs reviewed and stable, moving all extremities.   Post vital signs: Reviewed and stable.  Complications: No apparent anesthesia complications Last Vitals:  Vitals Value Taken Time  BP 118/76 04/28/2018 11:02 AM  Temp    Pulse 81 04/28/2018 11:05 AM  Resp 16 04/28/2018 11:05 AM  SpO2 100 % 04/28/2018 11:05 AM  Vitals shown include unvalidated device data.  Last Pain:  Vitals:   04/28/18 0559  TempSrc: Oral         Complications: No apparent anesthesia complications

## 2018-04-28 NOTE — Interval H&P Note (Signed)
History and Physical Interval Note:  04/28/2018 7:12 AM  Holly Pena  has presented today for surgery, with the diagnosis of CERVICAL CANCER  The various methods of treatment have been discussed with the patient and family. After consideration of risks, benefits and other options for treatment, the patient has consented to  Procedure(s): XI ROBOTIC ASSISTED RADICAL  HYSTERECTOMY (N/A) BIALTERAL SALPINGO OOPHORECTOMY (Bilateral) PELVIC LYMPH NODE DISSECTION (N/A) as a surgical intervention .  The patient's history has been reviewed, patient examined, no change in status, stable for surgery.  I have reviewed the patient's chart and labs.  Questions were answered to the patient's satisfaction.     Holly Pena

## 2018-04-29 ENCOUNTER — Encounter (HOSPITAL_COMMUNITY): Payer: Self-pay | Admitting: Gynecologic Oncology

## 2018-04-29 DIAGNOSIS — C539 Malignant neoplasm of cervix uteri, unspecified: Secondary | ICD-10-CM | POA: Diagnosis not present

## 2018-04-29 LAB — BASIC METABOLIC PANEL
Anion gap: 6 (ref 5–15)
BUN: 10 mg/dL (ref 6–20)
CALCIUM: 8.6 mg/dL — AB (ref 8.9–10.3)
CO2: 27 mmol/L (ref 22–32)
Chloride: 108 mmol/L (ref 98–111)
Creatinine, Ser: 0.71 mg/dL (ref 0.44–1.00)
GFR calc Af Amer: >60 mL/min (ref >60)
GFR calc non Af Amer: >60 mL/min (ref >60)
Glucose, Bld: 116 mg/dL — ABNORMAL HIGH (ref 70–99)
Potassium: 4.1 mmol/L (ref 3.5–5.1)
Sodium: 141 mmol/L (ref 135–145)

## 2018-04-29 LAB — HEMOGLOBIN AND HEMATOCRIT, BLOOD
HCT: 33.3 % — ABNORMAL LOW (ref 36.0–46.0)
Hemoglobin: 10.6 g/dL — ABNORMAL LOW (ref 12.0–15.0)

## 2018-04-29 LAB — CBC
HCT: 35.4 % — ABNORMAL LOW (ref 36.0–46.0)
Hemoglobin: 11.2 g/dL — ABNORMAL LOW (ref 12.0–15.0)
MCH: 29 pg (ref 26.0–34.0)
MCHC: 31.6 g/dL (ref 30.0–36.0)
MCV: 91.7 fL (ref 80.0–100.0)
PLATELETS: 182 10*3/uL (ref 150–400)
RBC: 3.86 MIL/uL — ABNORMAL LOW (ref 3.87–5.11)
RDW: 12.6 % (ref 11.5–15.5)
WBC: 6.2 10*3/uL (ref 4.0–10.5)
nRBC: 0 % (ref 0.0–0.2)

## 2018-04-29 MED ORDER — OXYCODONE HCL 5 MG PO TABS: 5.0000 mg | ORAL_TABLET | ORAL | 0 refills | Status: DC | PRN

## 2018-04-29 MED ORDER — SODIUM CHLORIDE 0.9 % IV BOLUS: 250.0000 mL | Freq: Once | INTRAVENOUS | Status: DC

## 2018-04-29 MED ORDER — SENNOSIDES-DOCUSATE SODIUM 8.6-50 MG PO TABS: 2.0000 | ORAL_TABLET | Freq: Every day | ORAL | 1 refills | Status: DC

## 2018-04-29 MED ORDER — IBUPROFEN 600 MG PO TABS: 600.0000 mg | ORAL_TABLET | Freq: Four times a day (QID) | ORAL | 1 refills | Status: DC | PRN

## 2018-04-29 MED ORDER — ERYTHROMYCIN BASE 500 MG PO TABS: 500.0000 mg | ORAL_TABLET | Freq: Four times a day (QID) | ORAL | 0 refills | Status: DC

## 2018-04-29 MED ORDER — ERYTHROMYCIN BASE 250 MG PO TABS
500.0000 mg | ORAL_TABLET | Freq: Four times a day (QID) | ORAL | Status: DC
  Administered 2018-04-29: 500 mg via ORAL
  Filled 2018-04-29 (×4): qty 2

## 2018-04-29 NOTE — Discharge Instructions (Signed)
04/29/2018  Return to work: 4-6 weeks if applicable  Activity: 1. Be up and out of the bed during the day.  Take a nap if needed.  You may walk up steps but be careful and use the hand rail.  Stair climbing will tire you more than you think, you may need to stop part way and rest.   2. No lifting or straining for 6 weeks.  3. No driving for 1 week(s).  Do not drive if you are taking narcotic pain medicine.  4. Shower daily.  Use soap and water on your incision and pat dry; don't rub.  No tub baths until cleared by your surgeon.   5. No sexual activity and nothing in the vagina for 8 weeks.  6. You may experience a small amount of clear drainage from your incisions, which is normal.  If the drainage persists or increases, please call the office.  7. You may experience vaginal spotting after surgery or around the 6-8 week mark from surgery when the stitches at the top of the vagina begin to dissolve.  The spotting is normal but if you experience heavy bleeding, call our office.  8. Take Tylenol or ibuprofen first for pain and only use oxycodone for severe pain not relieved by the Tylenol or Ibuprofen.  Monitor your Tylenol intake to a max of 4,000 mg a day.  9. You are to take erythromycin four times a day for seven days total to treat a urinary tract infection.    Diet: 1. Low sodium Heart Healthy Diet is recommended.  2. It is safe to use a laxative, such as Miralax or Colace, if you have difficulty moving your bowels. You can take Sennakot at bedtime every evening to keep bowel movements regular and to prevent constipation.    Wound Care: 1. Keep clean and dry.  Shower daily.  Reasons to call the Doctor:  Fever - Oral temperature greater than 100.4 degrees Fahrenheit  Foul-smelling vaginal discharge  Difficulty urinating  Nausea and vomiting  Increased pain at the site of the incision that is unrelieved with pain medicine.  Difficulty breathing with or without chest  pain  New calf pain especially if only on one side  Sudden, continuing increased vaginal bleeding with or without clots.   Contacts: For questions or concerns you should contact:  Dr. Everitt Amber at [place], NP at [phone]  After Hours: call [phone] and have the GYN Oncologist paged/contacted   Indwelling Urinary Catheter Care, Adult  An indwelling urinary catheter is a thin tube that is put into your bladder. The tube helps to drain pee (urine) out of your body. The tube goes in through your urethra. Your urethra is where pee comes out of your body. Your pee will come out through the catheter, then it will go into a bag (drainage bag). Take good care of your catheter so it will work well. How to wear your catheter and bag Supplies needed  Sticky tape (adhesive tape) or a leg strap.  Alcohol wipe or soap and water (if you use tape).  A clean towel (if you use tape).  Large overnight bag.  Smaller bag (leg bag). Wearing your catheter Attach your catheter to your leg with tape or a leg strap.  Make sure the catheter is not pulled tight.  If a leg strap gets wet, take it off and put on a dry strap.  If you use tape to hold the bag on your leg:  1. Use an alcohol wipe or soap and water to wash your skin where the tape made it sticky before. 2. Use a clean towel to pat-dry that skin. 3. Use new tape to make the bag stay on your leg. Wearing your bags You should have been given a large overnight bag.  You may wear the overnight bag in the day or night.  Always have the overnight bag lower than your bladder.  Do not let the bag touch the floor.  Before you go to sleep, put a clean plastic bag in a wastebasket. Then hang the overnight bag inside the wastebasket. You should also have a smaller leg bag that fits under your clothes.  Always wear the leg bag below your knee.  Do not wear your leg bag at night. How to care for your skin and  catheter Supplies needed  A clean washcloth.  Water and mild soap.  A clean towel. Caring for your skin and catheter      Clean the skin around your catheter every day: ? Wash your hands with soap and water. ? Wet a clean washcloth in warm water and mild soap. ? Clean the skin around your urethra. ? If you are female: ? Gently spread the folds of skin around your vagina (labia). ? With the washcloth in your other hand, wipe the inner side of your labia on each side. Wipe from front to back. ? If you are female: ? Pull back any skin that covers the end of your penis (foreskin). ? With the washcloth in your other hand, wipe your penis in small circles. Start wiping at the tip of your penis, then move away from the catheter. ? With your free hand, hold the catheter close to where it goes into your body. ? Keep holding the catheter during cleaning so it does not get pulled out. ? With the washcloth in your other hand, clean the catheter. ? Only wipe downward on the catheter. ? Do not wipe upward toward your body. Doing this may push germs into your urethra and cause infection. ? Use a clean towel to pat-dry the catheter and the skin around it. Make sure to wipe off all soap. ? Wash your hands with soap and water.  Shower every day. Do not take baths.  Do not use cream, ointment, or lotion on the area where the catheter goes into your body, unless your doctor tells you to.  Do not use powders, sprays, or lotions on your genital area.  Check your skin around the catheter every day for signs of infection. Check for: ? Redness, swelling, or pain. ? Fluid or blood. ? Warmth. ? Pus or a bad smell. How to empty the bag Supplies needed  Rubbing alcohol.  Gauze pad or cotton ball.  Tape or a leg strap. Emptying the bag Pour the pee out of your bag when it is ?- full, or at least 2-3 times a day. Do this for your overnight bag and your leg bag. 1. Wash your hands with soap and  water. 2. Separate (detach) the bag from your leg. 3. Hold the bag over the toilet or a clean pail. Keep the bag lower than your hips and bladder. This is so the pee (urine) does not go back into the tube. 4. Open the pour spout. It is at the bottom of the bag. 5. Empty the pee into the toilet or pail. Do not let the pour spout touch any surface. 6. Put  rubbing alcohol on a gauze pad or cotton ball. 7. Use the gauze pad or cotton ball to clean the pour spout. 8. Close the pour spout. 9. Attach the bag to your leg with tape or a leg strap. 10. Wash your hands with soap and water. Follow instructions for cleaning the drainage bag:  From the product maker.  As told by your doctor. How to change the bag Supplies needed  Alcohol wipes.  A clean bag.  Tape or a leg strap. Changing the bag Replace your bag with a clean bag once a month. If it starts to leak, smell bad, or look dirty, change it sooner. 1. Wash your hands with soap and water. 2. Separate the dirty bag from your leg. 3. Pinch the catheter with your fingers so that pee does not spill out. 4. Separate the catheter tube from the bag tube where these tubes connect (at the connection valve). Do not let the tubes touch any surface. 5. Clean the end of the catheter tube with an alcohol wipe. Use a different alcohol wipe to clean the end of the bag tube. 6. Connect the catheter tube to the tube of the clean bag. 7. Attach the clean bag to your leg with tape or a leg strap. Do not make the bag tight on your leg. 8. Wash your hands with soap and water. General rules   Never pull on your catheter. Never try to take it out. Doing that can hurt you.  Always wash your hands before and after you touch your catheter or bag. Use a mild, fragrance-free soap. If you do not have soap and water, use hand sanitizer.  Always make sure there are no twists or bends (kinks) in the catheter tube.  Always make sure there are no leaks in the  catheter or bag.  Drink enough fluid to keep your pee pale yellow.  Do not take baths, swim, or use a hot tub.  If you are female, wipe from front to back after you poop (have a bowel movement). Contact a doctor if:  Your pee is cloudy.  Your pee smells worse than usual.  Your catheter gets clogged.  Your catheter leaks.  Your bladder feels full. Get help right away if:  You have redness, swelling, or pain where the catheter goes into your body.  You have fluid, blood, pus, or a bad smell coming from the area where the catheter goes into your body.  Your skin feels warm where the catheter goes into your body.  You have a fever.  You have pain in your: ? Belly (abdomen). ? Legs. ? Lower back. ? Bladder.  You see blood in the catheter.  Your pee is pink or red.  You feel sick to your stomach (nauseous).  You throw up (vomit).  You have chills.  Your pee is not draining into the bag.  Your catheter gets pulled out. Summary  An indwelling urinary catheter is a thin tube that is placed into the bladder to help drain pee (urine) out of the body.  The catheter is placed into the part of the body that drains pee from the bladder (urethra).  Taking good care of your catheter will keep it working properly and help prevent problems.  Always wash your hands before and after touching your catheter or bag.  Never pull on your catheter or try to take it out. This information is not intended to replace advice given to you by your health care  provider. Make sure you discuss any questions you have with your health care provider. Document Released: 08/24/2012 Document Revised: 10/20/2017 Document Reviewed: 12/13/2016 Elsevier Interactive Patient Education  2019 [employer].  Oxycodone tablets or capsules What is this medicine? OXYCODONE (ox i KOE done) is a pain reliever. It is used to treat moderate to severe pain. This medicine may be used for other purposes; ask  your health care provider or pharmacist if you have questions. COMMON BRAND NAME(S): Dazidox, Endocodone, Oxaydo, OXECTA, OxyIR, Percolone, Roxicodone, Roxybond What should I tell my health care provider before I take this medicine? They need to know if you have any of these conditions: -Addison's disease -brain tumor -head injury -heart disease -history of drug or alcohol abuse problem -if you often drink alcohol -kidney disease -liver disease -lung or breathing disease, like asthma -mental illness -pancreatic disease -seizures -thyroid disease -an unusual or allergic reaction to oxycodone, codeine, hydrocodone, morphine, other medicines, foods, dyes, or preservatives -pregnant or trying to get pregnant -breast-feeding How should I use this medicine? Take this medicine by mouth with a glass of water. Follow the directions on the prescription label. You can take it with or without food. If it upsets your stomach, take it with food. Take your medicine at regular intervals. Do not take it more often than directed. Do not stop taking except on your doctor's advice. Some brands of this medicine, like Oxecta, have special instructions. Ask your doctor or pharmacist if these directions are for you: Do not cut, crush or chew this medicine. Swallow only one tablet at a time. Do not wet, soak, or lick the tablet before you take it. A special MedGuide will be given to you by the pharmacist with each prescription and refill. Be sure to read this information carefully each time. Talk to your pediatrician regarding the use of this medicine in children. Special care may be needed. Overdosage: If you think you have taken too much of this medicine contact a poison control center or emergency room at once. NOTE: This medicine is only for you. Do not share this medicine with others. What if I miss a dose? If you miss a dose, take it as soon as you can. If it is almost time for your next dose, take only  that dose. Do not take double or extra doses. What may interact with this medicine? This medicine may interact with the following medications: -alcohol -antihistamines for allergy, cough and cold -antiviral medicines for HIV or AIDS -atropine -certain antibiotics like clarithromycin, erythromycin, linezolid, rifampin -certain medicines for anxiety or sleep -certain medicines for bladder problems like oxybutynin, tolterodine -certain medicines for depression like amitriptyline, fluoxetine, sertraline -certain medicines for fungal infections like ketoconazole, itraconazole, voriconazole -certain medicines for migraine headache like almotriptan, eletriptan, frovatriptan, naratriptan, rizatriptan, sumatriptan, zolmitriptan -certain medicines for nausea or vomiting like dolasetron, ondansetron, palonosetron -certain medicines for Parkinson's disease like benztropine, trihexyphenidyl -certain medicines for seizures like phenobarbital, phenytoin, primidone -certain medicines for stomach problems like dicyclomine, hyoscyamine -certain medicines for travel sickness like scopolamine -diuretics -general anesthetics like halothane, isoflurane, methoxyflurane, propofol -ipratropium -local anesthetics like lidocaine, pramoxine, tetracaine -MAOIs like Carbex, Eldepryl, Marplan, Nardil, and Parnate -medicines that relax muscles for surgery -methylene blue -nilotinib -other narcotic medicines for pain or cough -phenothiazines like chlorpromazine, mesoridazine, prochlorperazine, thioridazine This list may not describe all possible interactions. Give your health care provider a list of all the medicines, herbs, non-prescription drugs, or dietary supplements you use. Also tell them if you smoke, drink  alcohol, or use illegal drugs. Some items may interact with your medicine. What should I watch for while using this medicine? Tell your doctor or health care professional if your pain does not go away, if it  gets worse, or if you have new or a different type of pain. You may develop tolerance to the medicine. Tolerance means that you will need a higher dose of the medicine for pain relief. Tolerance is normal and is expected if you take this medicine for a long time. Do not suddenly stop taking your medicine because you may develop a severe reaction. Your body becomes used to the medicine. This does NOT mean you are addicted. Addiction is a behavior related to getting and using a drug for a non-medical reason. If you have pain, you have a medical reason to take pain medicine. Your doctor will tell you how much medicine to take. If your doctor wants you to stop the medicine, the dose will be slowly lowered over time to avoid any side effects. There are different types of narcotic medicines (opiates). If you take more than one type at the same time or if you are taking another medicine that also causes drowsiness, you may have more side effects. Give your health care provider a list of all medicines you use. Your doctor will tell you how much medicine to take. Do not take more medicine than directed. Call emergency for help if you have problems breathing or unusual sleepiness. You may get drowsy or dizzy. Do not drive, use machinery, or do anything that needs mental alertness until you know how the medicine affects you. Do not stand or sit up quickly, especially if you are an older patient. This reduces the risk of dizzy or fainting spells. Alcohol may interfere with the effect of this medicine. Avoid alcoholic drinks. This medicine will cause constipation. Try to have a bowel movement at least every 2 to 3 days. If you do not have a bowel movement for 3 days, call your doctor or health care professional. Your mouth may get dry. Chewing sugarless gum or sucking hard candy, and drinking plenty of water may help. Contact your doctor if the problem does not go away or is severe. What side effects may I notice from  receiving this medicine? Side effects that you should report to your doctor or health care professional as soon as possible: -allergic reactions like skin rash, itching or hives, swelling of the face, lips, or tongue -breathing problems -confusion -signs and symptoms of low blood pressure like dizziness; feeling faint or lightheaded, falls; unusually weak or tired -trouble passing urine or change in the amount of urine -trouble swallowing Side effects that usually do not require medical attention (report to your doctor or health care professional if they continue or are bothersome): -constipation -dry mouth -nausea, vomiting -tiredness This list may not describe all possible side effects. Call your doctor for medical advice about side effects. You may report side effects to FDA at 1-800-FDA-1088. Where should I keep my medicine? Keep out of the reach of children. This medicine can be abused. Keep your medicine in a safe place to protect it from theft. Do not share this medicine with anyone. Selling or giving away this medicine is dangerous and against the law. Store at room temperature between 15 and 30 degrees C (59 and 86 degrees F). Protect from light. Keep container tightly closed. This medicine may cause harm and death if it is taken by other adults, children,  or pets. Return medicine that has not been used to an official disposal site. Contact the DEA at 828-487-4547 or your city/county government to find a site. If you cannot return the medicine, flush it down the toilet. Do not use the medicine after the expiration date. NOTE: This sheet is a summary. It may not cover all possible information. If you have questions about this medicine, talk to your doctor, pharmacist, or health care provider.  2019 Elsevier/Gold Standard (2016-09-03 16:13:10)

## 2018-04-29 NOTE — Discharge Summary (Signed)
Physician Discharge Summary  Patient ID: Holly Pena MRN: [id number] DOB/AGE: [id number] y.o.  Admit date: 04/28/2018 Discharge date: 04/29/2018  Admission Diagnoses: Cervical cancer [facility])  Discharge Diagnoses:  Principal Problem:   Cervical cancer ([place]) Active Problems:   Urinary tract infection   Discharged Condition:  The patient is in good condition and stable for discharge.    Hospital Course: On 04/28/2018, the patient underwent the following: Procedure(s): XI ROBOTIC ASSISTED RADICAL  HYSTERECTOMY BILATERAL SALPINGO OOPHORECTOMY PELVIC LYMPH NODE DISSECTION.  The postoperative course was uneventful except for mild hypotension on POD 1. Repeat H&H showed stability.  She was discharged to home on postoperative day 1 tolerating a regular diet, ambulating without difficulty, foley in place, pain controlled, passing flatus.  Consults: None  Significant Diagnostic Studies: None  Treatments: surgery: see above  Discharge Exam: Blood pressure (!) 94/56, pulse 80, temperature 98.3 F (36.8 C), temperature source Oral, resp. rate 16, last menstrual period 02/20/2018, SpO2 96 %. General appearance: alert, cooperative and no distress Resp: clear to auscultation bilaterally Cardio: regular rate and rhythm, S1, S2 normal, no murmur, click, rub or gallop GI: hypoactive bowel sounds, abdomen soft Extremities: extremities normal, atraumatic, no cyanosis or edema Incision/Wound: lap sites to the abdomen with dermabond without erythema or drainage  Disposition: Discharge disposition: 01-Home or Self Care       Discharge Instructions    Call MD for:  difficulty breathing, headache or visual disturbances   Complete by:  As directed    Call MD for:  extreme fatigue   Complete by:  As directed    Call MD for:  hives   Complete by:  As directed    Call MD for:  persistant dizziness or light-headedness   Complete by:  As directed    Call MD for:  persistant nausea and vomiting    Complete by:  As directed    Call MD for:  redness, tenderness, or signs of infection (pain, swelling, redness, odor or green/yellow discharge around incision site)   Complete by:  As directed    Call MD for:  severe uncontrolled pain   Complete by:  As directed    Call MD for:  temperature >100.4   Complete by:  As directed    Diet - low sodium heart healthy   Complete by:  As directed    Driving Restrictions   Complete by:  As directed    No driving for 1 week.  Do not take narcotics and drive.   Increase activity slowly   Complete by:  As directed    Lifting restrictions   Complete by:  As directed    No lifting greater than 10 lbs.   Sexual Activity Restrictions   Complete by:  As directed    No sexual activity, nothing in the vagina, for 8 weeks.     Allergies as of 04/29/2018   No Known Allergies     Medication List    TAKE these medications   erythromycin base 500 MG tablet Commonly known as:  E-MYCIN Take 1 tablet (500 mg total) by mouth every 6 (six) hours.   ibuprofen 600 MG tablet Commonly known as:  ADVIL,MOTRIN Take 1 tablet (600 mg total) by mouth every 6 (six) hours as needed for mild pain.   MOVE FREE PO Take 1 tablet by mouth daily.   oxyCODONE 5 MG immediate release tablet Commonly known as:  Oxy IR/ROXICODONE Take 1 tablet (5 mg total) by mouth every 4 (four)  hours as needed for severe pain.   senna-docusate 8.6-50 MG tablet Commonly known as:  Senokot-S Take 2 tablets by mouth at bedtime. Do not take if having diarrhea/loose stools   Vitamin D3 125 MCG (5000 UT) Tabs Take 5,000 Units by mouth daily.      Follow-up Information    Diontae Route, Carollee Massed, NP Follow up.   Specialty:  Gynecologic Oncology Contact information: [place] [id number] [phone]        Everitt Amber, MD Follow up on 05/27/2018.   Specialty:  Gynecologic Oncology Why:  at 3:30pm at the [facility] for post-op check Contact information: [place] [address] [phone]           Greater than thirty minutes were spend for face to face discharge instructions and discharge orders/summary in EPIC.   Signed: MELBA ARAKI 04/29/2018, 2:58 PM

## 2018-05-01 ENCOUNTER — Telehealth: Payer: Self-pay

## 2018-05-01 NOTE — Telephone Encounter (Signed)
Ms Araque called stating that her labia are swollen today after surgery 04-28-18. Reviewed with Joylene John, NP.  Told her that This can occur after the surgery and recommends that she get a bag of frozen peas and make multiple bags with small ziploc bags and appy to labia to help with the swelling.   Call the office if symptoms do not improve.

## 2018-05-02 ENCOUNTER — Encounter: Payer: Self-pay | Admitting: Gynecologic Oncology

## 2018-05-02 ENCOUNTER — Emergency Department (HOSPITAL_COMMUNITY)
Admission: EM | Admit: 2018-05-02 | Discharge: 2018-05-02 | Discharge status: Against Medical Advice | Payer: BLUE CROSS/BLUE SHIELD | Attending: Emergency Medicine | Admitting: Emergency Medicine

## 2018-05-02 DIAGNOSIS — Z79899 Other long term (current) drug therapy: Secondary | ICD-10-CM | POA: Diagnosis not present

## 2018-05-02 DIAGNOSIS — R110 Nausea: Secondary | ICD-10-CM | POA: Diagnosis not present

## 2018-05-02 MED ORDER — ONDANSETRON 8 MG PO TBDP
8.0000 mg | ORAL_TABLET | Freq: Once | ORAL | Status: AC
  Administered 2018-05-02: 8 mg via ORAL
  Filled 2018-05-02: qty 1

## 2018-05-02 NOTE — ED Triage Notes (Signed)
Pt complains of abdominal pain after taking antibiotic last night. Pt does not have pain at this time. Pt has been taking erythromycin since hysterectomy on 12/17. Pt also complains of bilateral leg swelling x 2 days.

## 2018-05-02 NOTE — ED Notes (Addendum)
Patient refused all blood work, told me she only came here to get something for nausea. Notified Provider D Ray MD. MD was going to speak to patient regarding this. I went back to give patient Zofran and after I completed that, patient left. Patient stated she got nauseous after taking antibiotic last night with no food. Educated to try taking with food. Patient insisted on leaving after taking zofran. Had patient sign AMA. MD educated patient upon arrival of the need for blood work to check electrolytes and CBC.

## 2018-05-02 NOTE — ED Provider Notes (Signed)
[facility] DEPT Provider Note   CSN: [id number] Arrival date & time: 05/02/18  7628     History   Chief Complaint Chief Complaint  Patient presents with  . Abdominal Pain    HPI Holly Pena is a 52 y.o. female.  HPI 52 yo female ho cervical cancer s/p hystterctomy 12/17 home with foley last week.  Patient states she took her erythromycin today and had nausea..  Presents today due to this nausea she experienced when she took her e-mycin.  SHe denies vomiting, fever, abdominal pain, cough, leg swelling.  She reports an otherwise uneventful post op course.  She has taken ibuprofen for pain, stool softener, and erythromycin.  Past Medical History:  Diagnosis Date  . Cervical cancer ([place])   . Frequent headaches    due to lack of sleep  . Numbness    both hands and fingers  . PMB (postmenopausal bleeding)   . Seasonal allergies   . Squamous cell carcinoma in situ   . Tuberculosis    positive, CXR clear    Patient Active Problem List   Diagnosis Date Noted  . Cervical cancer ([place]) 04/28/2018  . Urinary tract infection 04/28/2018  . Malignant neoplasm of cervix ([place])   . Perimenopause 08/13/2016  . Frequent headaches     Past Surgical History:  Procedure Laterality Date  . CESAREAN SECTION     x 2  . ENDOMETRIAL BIOPSY  03/10/2018  . lymph node surgery     at 25 months old, mother told her but not sure what surgery exactly  . PELVIC LYMPH NODE DISSECTION N/A 04/28/2018   Procedure: PELVIC LYMPH NODE DISSECTION;  Surgeon: Everitt Amber, MD;  Location: [facility];  Service: Gynecology;  Laterality: N/A;  . ROBOTIC ASSISTED TOTAL HYSTERECTOMY N/A 04/28/2018   Procedure: XI ROBOTIC ASSISTED RADICAL  HYSTERECTOMY;  Surgeon: Everitt Amber, MD;  Location: [facility];  Service: Gynecology;  Laterality: N/A;  . SALPINGOOPHORECTOMY Bilateral 04/28/2018   Procedure: Marilynn Rail SALPINGO OOPHORECTOMY;  Surgeon: Everitt Amber, MD;  Location: [facility];  Service: Gynecology;   Laterality: Bilateral;     OB History    Gravida  2   Para  2   Term      Preterm      AB      Living  2     SAB      TAB      Ectopic      Multiple      Live Births  2            Home Medications    Prior to Admission medications   Medication Sig Start Date End Date Taking? Authorizing Provider  Cholecalciferol (VITAMIN D3) 125 MCG (5000 UT) TABS Take 5,000 Units by mouth daily.     [provider]  erythromycin (E-MYCIN) 500 MG tablet Take 1 tablet (500 mg total) by mouth every 6 (six) hours. 04/29/18   Joylene John D, NP  Glucosamine-Chondroitin (MOVE FREE PO) Take 1 tablet by mouth daily.     [provider]  ibuprofen (ADVIL,MOTRIN) 600 MG tablet Take 1 tablet (600 mg total) by mouth every 6 (six) hours as needed for mild pain. 04/29/18   Joylene John D, NP  oxyCODONE (OXY IR/ROXICODONE) 5 MG immediate release tablet Take 1 tablet (5 mg total) by mouth every 4 (four) hours as needed for severe pain. 04/29/18   Cross, [name] D, NP  senna-docusate (SENOKOT-S) 8.6-50 MG tablet Take 2 tablets  by mouth at bedtime. Do not take if having diarrhea/loose stools 04/29/18   Joylene John D, NP    Family History Family History  Problem Relation Age of Onset  . Healthy Mother   . Hypertension Mother   . Healthy Father   . Cancer Neg Hx   . Heart disease Neg Hx     Social History Social History   Tobacco Use  . Smoking status: Never Smoker  . Smokeless tobacco: Never Used  Substance Use Topics  . Alcohol use: Yes    Comment: occ  . Drug use: No     Allergies   Patient has no known allergies.   Review of Systems Review of Systems  All other systems reviewed and are negative.    Physical Exam Updated Vital Signs BP 115/81 (BP Location: Left Arm)   Pulse 71   Temp 97.7 F (36.5 C) (Oral)   Resp 16   LMP 02/20/2018 Comment: no period in 2 years  SpO2 98%   Physical Exam Vitals signs and nursing note reviewed.    Constitutional:      Appearance: She is well-developed.  HENT:     Head: Normocephalic and atraumatic.     Mouth/Throat:     Mouth: Mucous membranes are moist.  Eyes:     Extraocular Movements: Extraocular movements intact.     Pupils: Pupils are equal, round, and reactive to light.  Cardiovascular:     Rate and Rhythm: Normal rate and regular rhythm.     Heart sounds: Normal heart sounds.  Pulmonary:     Effort: Pulmonary effort is normal.  Abdominal:     General: Abdomen is flat. Bowel sounds are normal.     Palpations: Abdomen is soft.     Tenderness: There is no abdominal tenderness.     Comments: Well healing incisions  Skin:    General: Skin is warm.     Capillary Refill: Capillary refill takes less than 2 seconds.  Neurological:     General: No focal deficit present.     Mental Status: She is alert.  Psychiatric:        Mood and Affect: Mood normal.      ED Treatments / Results  Labs (all labs ordered are listed, but only abnormal results are displayed) Labs Reviewed  LIPASE, BLOOD  COMPREHENSIVE METABOLIC PANEL  CBC  URINALYSIS, ROUTINE W REFLEX MICROSCOPIC    EKG None  Radiology No results found.  Procedures Procedures (including critical care time)  Medications Ordered in ED Medications  ondansetron (ZOFRAN-ODT) disintegrating tablet 8 mg (8 mg Oral Given 05/02/18 1207)     Initial Impression / Assessment and Plan / ED Course  I have reviewed the triage vital signs and the nursing notes.  Pertinent labs & imaging results that were available during my care of the patient were reviewed by me and considered in my medical decision making (see chart for details).     Plan labs and zofran 12:18 PM RN notified me that patient did not want labs and had left ama  Final Clinical Impressions(s) / ED Diagnoses   Final diagnoses:  Nausea    ED Discharge Orders    None       Pattricia Boss, MD 05/02/18 1219

## 2018-05-06 ENCOUNTER — Encounter: Payer: Self-pay | Admitting: Gynecologic Oncology

## 2018-05-08 ENCOUNTER — Inpatient Hospital Stay (HOSPITAL_BASED_OUTPATIENT_CLINIC_OR_DEPARTMENT_OTHER): Payer: BLUE CROSS/BLUE SHIELD | Admitting: Gynecologic Oncology

## 2018-05-08 ENCOUNTER — Encounter: Payer: Self-pay | Admitting: Gynecologic Oncology

## 2018-05-08 ENCOUNTER — Inpatient Hospital Stay: Payer: BLUE CROSS/BLUE SHIELD

## 2018-05-08 VITALS — BP 106/64 | HR 68 | Temp 98.0°F | Resp 18 | Ht 63.0 in | Wt 138.0 lb

## 2018-05-08 DIAGNOSIS — Z7189 Other specified counseling: Secondary | ICD-10-CM

## 2018-05-08 DIAGNOSIS — N390 Urinary tract infection, site not specified: Secondary | ICD-10-CM | POA: Diagnosis not present

## 2018-05-08 DIAGNOSIS — C531 Malignant neoplasm of exocervix: Secondary | ICD-10-CM | POA: Diagnosis not present

## 2018-05-08 DIAGNOSIS — R339 Retention of urine, unspecified: Secondary | ICD-10-CM | POA: Diagnosis not present

## 2018-05-08 NOTE — Progress Notes (Signed)
S: Patient presents to the office today for voiding trial and foley removal s/p radical hysterectomy. Reports doing well at home.  She recently went to the ED for pain in the abdomen after taking the erythromycin but she felt it was related to taking the medication on an empty stomach.  The pain resolved after taking with food. No fever or chills reported.  States she noticed a small amount of blood in the urine leg bag yesterday that resolved.  Tolerating diet with no nausea or emesis.  Has a copy of her path report that was released automatically in [facility].  Asking if she "is good" based on the results. Translator in the room.  Husband waiting in the lobby. No other concerns voiced.  O: Alert, oriented x 3. Lungs clear.  HR regular in rate and rhythm. Abdomen soft with lap sites healing. No evidence of infection.  Resolving ecchymosis present around the incisions.  Foley to leg bag with clear, yellow urine present.  No BLE edema present.  130 cc of sterile normal saline instilled in the bladder via foley per Dr. Denman George.  Patient tolerated well but reported strong urge to urinate after instillation of 130 cc so catheter was removed without difficulty and pt was assisted to the bathroom.  Foley was removed at 10:40. 1st void of 100 cc of clear, yellow urine at 12:00pm.  At 1:20 pt voided another 100 cc of clear, yellow urine but felt she had not completely emptied.  To assess post void residual, a foley catheter was inserted with the plan to keep in place if greater than 50 cc residual.  Patient tolerated insertion of foley without difficulty.  500 cc of clear, yellow urine out. Sample obtained to sent for culture to follow up on recent UTI with completion of erythromycin. Catheter hooked to leg bag per pt request.  A: Stage IIIC cervical cancer s/p radical hysterectomy on 04/28/18.    P: Voiding trial unsuccessful. Per Dr. Denman George, plan for repeat voiding trial when she returns to the office on May 27, 2018.  Patient would like to attempt sooner.  Appt made for May 20, 2018 and patient advised that if she comes closer to the date and decides she wants to wait till Jan 15 we can cancel the appt.  Advised she would be contacted with the results of the urine culture from today.  Supplies given for foley at home including large collecting bag and two leg bags. Reportable signs and symptoms reviewed and after hours contact information discussed.    Dr. Denman George spoke with the patient and her husband about her final pathology along with recommendations.  See separate note for details.

## 2018-05-08 NOTE — Patient Instructions (Signed)
Continue with catheter care.  Plan to follow up on Jan 8 for catheter removal.  Please call for any questions or concerns in between that time.

## 2018-05-08 NOTE — Progress Notes (Signed)
I discussed pathology findngs with the patient, her husband and a translator who translated all that was spoken.  I reviewed that her pathology showed multiple high risk factors in her pathology specimen including a tumor size greater than 4 cm, the presence of lymphovascular space invasion, deep cervical stroma involvement of the tumor, and 2+ para cervical nodes with metastatic cancer.  This means that she is a stage III C1 ([place] FIGO staging) cervical cancer.  Recommended therapy for this is whole pelvic radiation, consideration of vaginal brachii therapy, and concomitant radiosensitizing cisplatin.  I explained that without the addition of adjuvant therapy there is an approximately 40% risk of recurrence.  I discussed that the addition of adjuvant therapy however this risk of recurrence, however does not eliminate it.  I explained that clinical trials have demonstrated that patients with high risk cervical cancer on pathology who receive adjuvant chemo therapy radiation have improved survival compared to those to receive radiation alone, or close observation.  She is agreeable to seeing Dr. Sondra Come and Dr. Roe Coombs to further plan therapy.  She is having issues with voiding dysfunction following her radical hysterectomy, and a Foley catheter was replaced today.  We will retry voiding in 1 to 2 weeks.

## 2018-05-08 NOTE — Addendum Note (Signed)
Addended by: Thereasa Solo on: 05/08/2018 03:36 PM   Modules accepted: Level of Service

## 2018-05-09 LAB — URINE CULTURE: Culture: <10000 — AB

## 2018-05-10 ENCOUNTER — Encounter: Payer: Self-pay | Admitting: Gynecologic Oncology

## 2018-05-11 ENCOUNTER — Telehealth: Payer: Self-pay

## 2018-05-11 NOTE — Telephone Encounter (Signed)
Outgoing call to pt to check on foley per Joylene John NP, "can you see how her urine looks this am, no evidence of infection on recent culture"- Pt reports urine is "back to normal now"  "it is clear yellow urine."  I let her know no evidence of infection.  Pt voiced understanding.  No other needs per pt at this time. Next appt is Jan 8th, reminded her to call back if any questions or concerns prior to appt.

## 2018-05-12 ENCOUNTER — Emergency Department (HOSPITAL_COMMUNITY): Payer: BLUE CROSS/BLUE SHIELD

## 2018-05-12 ENCOUNTER — Emergency Department (HOSPITAL_COMMUNITY)
Admission: EM | Admit: 2018-05-12 | Discharge: 2018-05-13 | Disposition: A | Discharge status: Home / Self Care | Payer: BLUE CROSS/BLUE SHIELD | Attending: Emergency Medicine | Admitting: Emergency Medicine

## 2018-05-12 ENCOUNTER — Other Ambulatory Visit: Payer: Self-pay

## 2018-05-12 ENCOUNTER — Telehealth: Payer: Self-pay

## 2018-05-12 ENCOUNTER — Encounter (HOSPITAL_COMMUNITY): Payer: Self-pay | Admitting: Emergency Medicine

## 2018-05-12 DIAGNOSIS — Z79899 Other long term (current) drug therapy: Secondary | ICD-10-CM | POA: Diagnosis not present

## 2018-05-12 DIAGNOSIS — N3001 Acute cystitis with hematuria: Secondary | ICD-10-CM | POA: Insufficient documentation

## 2018-05-12 DIAGNOSIS — Z8541 Personal history of malignant neoplasm of cervix uteri: Secondary | ICD-10-CM | POA: Diagnosis not present

## 2018-05-12 DIAGNOSIS — R319 Hematuria, unspecified: Secondary | ICD-10-CM | POA: Diagnosis present

## 2018-05-12 LAB — URINALYSIS, ROUTINE W REFLEX MICROSCOPIC
Bilirubin Urine: NEGATIVE
Glucose, UA: NEGATIVE mg/dL
Ketones, ur: 20 mg/dL — AB
Nitrite: POSITIVE — AB
Protein, ur: 30 mg/dL — AB
RBC / HPF: >50 RBC/hpf — ABNORMAL HIGH (ref 0–5)
Specific Gravity, Urine: 1.012 (ref 1.005–1.030)
pH: 5 (ref 5.0–8.0)

## 2018-05-12 LAB — BASIC METABOLIC PANEL
Anion gap: 9 (ref 5–15)
BUN: 12 mg/dL (ref 6–20)
CO2: 25 mmol/L (ref 22–32)
Calcium: 9.3 mg/dL (ref 8.9–10.3)
Chloride: 106 mmol/L (ref 98–111)
Creatinine, Ser: 0.68 mg/dL (ref 0.44–1.00)
GFR calc Af Amer: >60 mL/min (ref >60)
GFR calc non Af Amer: >60 mL/min (ref >60)
Glucose, Bld: 97 mg/dL (ref 70–99)
Potassium: 3.5 mmol/L (ref 3.5–5.1)
Sodium: 140 mmol/L (ref 135–145)

## 2018-05-12 LAB — CBC WITH DIFFERENTIAL/PLATELET
Abs Immature Granulocytes: 0.02 10*3/uL (ref 0.00–0.07)
Basophils Absolute: 0 10*3/uL (ref 0.0–0.1)
Basophils Relative: 1 %
Eosinophils Absolute: 0.1 10*3/uL (ref 0.0–0.5)
Eosinophils Relative: 3 %
HCT: 36.9 % (ref 36.0–46.0)
Hemoglobin: 12.1 g/dL (ref 12.0–15.0)
Immature Granulocytes: 0 %
LYMPHS ABS: 1.3 10*3/uL (ref 0.7–4.0)
Lymphocytes Relative: 25 %
MCH: 30 pg (ref 26.0–34.0)
MCHC: 32.8 g/dL (ref 30.0–36.0)
MCV: 91.6 fL (ref 80.0–100.0)
MONO ABS: 0.3 10*3/uL (ref 0.1–1.0)
Monocytes Relative: 6 %
Neutro Abs: 3.4 10*3/uL (ref 1.7–7.7)
Neutrophils Relative %: 65 %
Platelets: 297 10*3/uL (ref 150–400)
RBC: 4.03 MIL/uL (ref 3.87–5.11)
RDW: 12.8 % (ref 11.5–15.5)
WBC: 5.1 10*3/uL (ref 4.0–10.5)
nRBC: 0 % (ref 0.0–0.2)

## 2018-05-12 MED ORDER — SODIUM CHLORIDE 0.9 % IV SOLN
1.0000 g | Freq: Once | INTRAVENOUS | Status: AC
  Administered 2018-05-12: 1 g via INTRAVENOUS
  Filled 2018-05-12: qty 10

## 2018-05-12 MED ORDER — IOPAMIDOL (ISOVUE-300) INJECTION 61%
100.0000 mL | Freq: Once | INTRAVENOUS | Status: AC | PRN
  Administered 2018-05-12: 100 mL via INTRAVENOUS

## 2018-05-12 MED ORDER — SODIUM CHLORIDE (PF) 0.9 % IJ SOLN
INTRAMUSCULAR | Status: AC
  Filled 2018-05-12: qty 50

## 2018-05-12 MED ORDER — CEPHALEXIN 500 MG PO CAPS: 500.0000 mg | ORAL_CAPSULE | Freq: Two times a day (BID) | ORAL | 0 refills | Status: AC

## 2018-05-12 MED ORDER — IOPAMIDOL (ISOVUE-300) INJECTION 61%
INTRAVENOUS | Status: AC
  Filled 2018-05-12: qty 100

## 2018-05-12 NOTE — ED Provider Notes (Signed)
[facility] DEPT Provider Note   CSN: [id number] Arrival date & time: 05/12/18  1650     History   Chief Complaint Chief Complaint  Patient presents with  . Hematuria    HPI Holly Pena is a 52 y.o. female with past medical history of cervical cancer, tuberculosis presenting to the emergency department with complaint of intermittent hematuria for a few days.  Patient's states she had surgery on her for cervix on 04/28/2018.  Since that time she has had a urinary catheter in place.  She states it was changed once on December 27.  She has been having intermittent episodes of brown looking urine with blood clots.  She states last episode was today around 1 PM.  She denies associated pain.  Does not endorse increased odor.  Patient called her oncologist office today who recommended she report to the ED with concern for damage to her urinary tract from the surgery.  Has been having some lingering abdominal pain since the surgery.  Mostly in the left lower quadrant.  No fevers or chills.  The history is provided by the patient.    Past Medical History:  Diagnosis Date  . Cervical cancer ([place])   . Frequent headaches    due to lack of sleep  . Numbness    both hands and fingers  . PMB (postmenopausal bleeding)   . Seasonal allergies   . Squamous cell carcinoma in situ   . Tuberculosis    positive, CXR clear    Patient Active Problem List   Diagnosis Date Noted  . Cervical cancer ([place]) 04/28/2018  . Urinary tract infection 04/28/2018  . Perimenopause 08/13/2016  . Frequent headaches     Past Surgical History:  Procedure Laterality Date  . CESAREAN SECTION     x 2  . ENDOMETRIAL BIOPSY  03/10/2018  . lymph node surgery     at 32 months old, mother told her but not sure what surgery exactly  . PELVIC LYMPH NODE DISSECTION N/A 04/28/2018   Procedure: PELVIC LYMPH NODE DISSECTION;  Surgeon: Everitt Amber, MD;  Location: [facility];  Service: Gynecology;   Laterality: N/A;  . ROBOTIC ASSISTED TOTAL HYSTERECTOMY N/A 04/28/2018   Procedure: XI ROBOTIC ASSISTED RADICAL  HYSTERECTOMY;  Surgeon: Everitt Amber, MD;  Location: [facility];  Service: Gynecology;  Laterality: N/A;  . SALPINGOOPHORECTOMY Bilateral 04/28/2018   Procedure: Marilynn Rail SALPINGO OOPHORECTOMY;  Surgeon: Everitt Amber, MD;  Location: [facility];  Service: Gynecology;  Laterality: Bilateral;     OB History    Gravida  2   Para  2   Term      Preterm      AB      Living  2     SAB      TAB      Ectopic      Multiple      Live Births  2            Home Medications    Prior to Admission medications   Medication Sig Start Date End Date Taking? Authorizing Provider  Cholecalciferol (VITAMIN D3) 125 MCG (5000 UT) TABS Take 5,000 Units by mouth daily.    Yes [provider]  Glucosamine-Chondroitin (MOVE FREE PO) Take 1 tablet by mouth daily.    Yes [provider]  ibuprofen (ADVIL,MOTRIN) 600 MG tablet Take 1 tablet (600 mg total) by mouth every 6 (six) hours as needed for mild pain. 04/29/18  Yes Cross, [name]  D, NP  senna-docusate (SENOKOT-S) 8.6-50 MG tablet Take 2 tablets by mouth at bedtime. Do not take if having diarrhea/loose stools 04/29/18  Yes Cross, Melissa D, NP  cephALEXin (KEFLEX) 500 MG capsule Take 1 capsule (500 mg total) by mouth 2 (two) times daily for 7 days. 05/12/18 05/19/18  Robinson, [place] N, PA-C  erythromycin (E-MYCIN) 500 MG tablet Take 1 tablet (500 mg total) by mouth every 6 (six) hours. Patient not taking: Reported on 05/12/2018 04/29/18   Joylene John D, NP  oxyCODONE (OXY IR/ROXICODONE) 5 MG immediate release tablet Take 1 tablet (5 mg total) by mouth every 4 (four) hours as needed for severe pain. Patient not taking: Reported on 05/08/2018 04/29/18   Dorothyann Gibbs, NP    Family History Family History  Problem Relation Age of Onset  . Healthy Mother   . Hypertension Mother   . Healthy Father   . Cancer Neg Hx    . Heart disease Neg Hx     Social History Social History   Tobacco Use  . Smoking status: Never Smoker  . Smokeless tobacco: Never Used  Substance Use Topics  . Alcohol use: Yes    Comment: occ  . Drug use: No     Allergies   Patient has no known allergies.   Review of Systems Review of Systems  Constitutional: Negative for chills and fever.  Gastrointestinal: Positive for abdominal pain. Negative for nausea and vomiting.  Genitourinary: Positive for hematuria.  All other systems reviewed and are negative.    Physical Exam Updated Vital Signs BP 111/89 (BP Location: Left Arm)   Pulse 60   Temp 98.7 F (37.1 C) (Oral)   Resp 17   LMP 02/20/2018 Comment: no period in 2 years  SpO2 100%   Physical Exam Vitals signs and nursing note reviewed.  Constitutional:      General: She is not in acute distress.    Appearance: She is well-developed. She is not ill-appearing.  HENT:     Head: Normocephalic and atraumatic.  Eyes:     Conjunctiva/sclera: Conjunctivae normal.  Cardiovascular:     Rate and Rhythm: Normal rate and regular rhythm.  Pulmonary:     Effort: Pulmonary effort is normal.     Breath sounds: Normal breath sounds.  Abdominal:     General: Bowel sounds are normal.     Palpations: Abdomen is soft.     Comments: Well healing well approximated surgical scars to the abdomen.  Some tenderness to the lower abdomen, no guarding or rebound.  Skin:    General: Skin is warm.  Neurological:     Mental Status: She is alert.  Psychiatric:        Behavior: Behavior normal.      ED Treatments / Results  Labs (all labs ordered are listed, but only abnormal results are displayed) Labs Reviewed  URINALYSIS, ROUTINE W REFLEX MICROSCOPIC - Abnormal; Notable for the following components:      Result Value   APPearance HAZY (*)    Hgb urine dipstick LARGE (*)    Ketones, ur 20 (*)    Protein, ur 30 (*)    Nitrite POSITIVE (*)    Leukocytes, UA TRACE (*)      RBC / HPF >50 (*)    Bacteria, UA RARE (*)    All other components within normal limits  URINE CULTURE  BASIC METABOLIC PANEL  CBC WITH DIFFERENTIAL/PLATELET    EKG None  Radiology Ct Abdomen Pelvis  W Contrast  Result Date: 05/12/2018 CLINICAL DATA:  Status post surgery for cervical cancer 2 days ago. Left lower quadrant abdominal pain and hematuria, acute onset. EXAM: CT ABDOMEN AND PELVIS WITH CONTRAST TECHNIQUE: Multidetector CT imaging of the abdomen and pelvis was performed using the standard protocol following bolus administration of intravenous contrast. CONTRAST:  166mL ISOVUE-300 IOPAMIDOL (ISOVUE-300) INJECTION 61% COMPARISON:  PET/CT performed 04/03/2018 FINDINGS: Lower chest: The visualized lung bases are grossly clear. The visualized portions of the mediastinum are unremarkable. Hepatobiliary: The liver is unremarkable in appearance. The gallbladder is unremarkable in appearance. The common bile duct remains normal in caliber. Pancreas: The pancreas is within normal limits. Spleen: The spleen is unremarkable in appearance. Adrenals/Urinary Tract: The adrenal glands are unremarkable in appearance. The kidneys are within normal limits. There is no evidence of hydronephrosis. No renal or ureteral stones are identified. No perinephric stranding is seen. Stomach/Bowel: The stomach is unremarkable in appearance. The small bowel is within normal limits. The appendix is normal in caliber, without evidence of appendicitis. The colon is unremarkable in appearance. Vascular/Lymphatic: The abdominal aorta is unremarkable in appearance. The inferior vena cava is grossly unremarkable. No retroperitoneal lymphadenopathy is seen. No pelvic sidewall lymphadenopathy is identified. Reproductive: The bladder is decompressed, with a Foley catheter in place. Soft tissue inflammation about the bladder could reflect cystitis. Would correlate with the patient's symptoms. The patient is status post  hysterectomy. No suspicious adnexal masses are seen. A small to moderate amount of free fluid is noted within the pelvis, of uncertain significance. Other: No additional soft tissue abnormalities are seen. Musculoskeletal: No acute osseous abnormalities are identified. The visualized musculature is unremarkable in appearance. IMPRESSION: 1. Soft tissue inflammation about the bladder could reflect cystitis. Would correlate with the patient's symptoms. 2. Small to moderate amount of free fluid within the pelvis, of uncertain significance. This may simply be postoperative in nature, though if urine output from the Foley catheter decreases, further evaluation could be considered to exclude urine leak. Electronically Signed   By: Garald Balding M.D.   On: 05/12/2018 22:29    Procedures Procedures (including critical care time)  Medications Ordered in ED Medications  iopamidol (ISOVUE-300) 61 % injection (has no administration in time range)  sodium chloride (PF) 0.9 % injection (has no administration in time range)  cefTRIAXone (ROCEPHIN) 1 g in sodium chloride 0.9 % 100 mL IVPB (1 g Intravenous New Bag/Given 05/12/18 2328)  iopamidol (ISOVUE-300) 61 % injection 100 mL (100 mLs Intravenous Contrast Given 05/12/18 2157)     Initial Impression / Assessment and Plan / ED Course  I have reviewed the triage vital signs and the nursing notes.  Pertinent labs & imaging results that were available during my care of the patient were reviewed by me and considered in my medical decision making (see chart for details).  Clinical Course as of May 12 2349  Tue May 12, 2018  2322 normal  Hemoglobin: 12.1 [JR]  2322 normal  Creatinine: 0.68 [JR]    Clinical Course User Index [JR] Robinson, [place] N, PA-C    Patient presenting with intermittent hematuria and malodorous urine since last Foley catheter change on 05/08/2018.  Catheter is in place after surgery on her cervix for cervical cancer on 04/28/2018.   Reports some mild lingering abdominal pain since the surgery, however nothing remarkable.  Otherwise feels well.  Surgical scars appear to be healing well without signs of infection.  Abdomen is soft without peritoneal signs.  UA with large  blood, nitrite positive, and consistent with infection.  Culture sent.  CBC with normal hemoglobin, no leukocytosis. BMP with normal kidney function.  CT ordered to evaluate for any obvious damage to the urinary tract from the surgery, CT is consistent with cystitis.  There is some trace fluid in the pelvis though favored to be postoperative.  UTI treated with a dose of IV Rocephin, will discharge with p.o. Keflex.  Instructed follow-up with her oncologist, and strict return precautions.  Patient instructed to return to the ED should her urine output decrease, per radiologist recommendation.  Patient is agreeable to plan and safe for discharge.  Patient discussed with Dr. Darl Householder, agreeable to plan.  Discussed results, findings, treatment and follow up. Patient advised of return precautions. Patient verbalized understanding and agreed with plan.  Final Clinical Impressions(s) / ED Diagnoses   Final diagnoses:  Acute cystitis with hematuria    ED Discharge Orders         Ordered    cephALEXin (KEFLEX) 500 MG capsule  2 times daily     05/12/18 2327           Robinson, [place] N, PA-C 05/12/18 2352    Drenda Freeze, MD 05/13/18 [phone]

## 2018-05-12 NOTE — ED Notes (Signed)
Urine sample and culture sent to lab. Patient refused IV and blood work until urinalysis is resulted. EDP made aware of refusal.

## 2018-05-12 NOTE — Telephone Encounter (Signed)
Pt came to the [facility] today about 1615 wanting to be seen for blood and clots in her urine bag from Foley bag. She is not in any pain.   Told her that she needs to go to the [facility] ED to be evaluated as she could have a bladder or ureter injury from the surgery.  Reviewed the situation with Joylene John, NP and she recommends evaluation in the ED as above. Spoke with nurse in the ED for charge nurse to relay this information.

## 2018-05-12 NOTE — ED Triage Notes (Addendum)
Patient reports she had surgery to remove cervical cancer x2 days ago. States she noticed bloody urine with clots in catheter drainage bag today. Denies pain and fever.

## 2018-05-12 NOTE — Discharge Instructions (Addendum)
Please read instructions below. Starting tomorrow, take your antibiotic, keflex, as directed until it is gone. Drink plenty of water. Schedule an appointment with your Oncologist who performed your surgery, to follow up on your visit today and CT findings.  Return to the ER if you have a decrease in urine draining in the bag, develop a fever, have nausea/vomiting, significant abdominal pain, or new or concerning symptoms.

## 2018-05-13 ENCOUNTER — Encounter: Payer: Self-pay | Admitting: Gynecologic Oncology

## 2018-05-14 ENCOUNTER — Inpatient Hospital Stay: Payer: BLUE CROSS/BLUE SHIELD | Attending: Obstetrics | Admitting: Hematology and Oncology

## 2018-05-14 ENCOUNTER — Encounter: Payer: Self-pay | Admitting: Hematology and Oncology

## 2018-05-14 ENCOUNTER — Telehealth: Payer: Self-pay | Admitting: Hematology and Oncology

## 2018-05-14 VITALS — BP 119/63 | HR 82 | Temp 98.0°F | Resp 18 | Ht 63.0 in | Wt 139.4 lb

## 2018-05-14 DIAGNOSIS — I890 Lymphedema, not elsewhere classified: Secondary | ICD-10-CM | POA: Diagnosis not present

## 2018-05-14 DIAGNOSIS — C539 Malignant neoplasm of cervix uteri, unspecified: Secondary | ICD-10-CM

## 2018-05-14 DIAGNOSIS — C531 Malignant neoplasm of exocervix: Secondary | ICD-10-CM

## 2018-05-14 DIAGNOSIS — Z23 Encounter for immunization: Secondary | ICD-10-CM | POA: Diagnosis not present

## 2018-05-14 DIAGNOSIS — D72819 Decreased white blood cell count, unspecified: Secondary | ICD-10-CM | POA: Diagnosis not present

## 2018-05-14 DIAGNOSIS — Z466 Encounter for fitting and adjustment of urinary device: Secondary | ICD-10-CM | POA: Diagnosis not present

## 2018-05-14 DIAGNOSIS — M549 Dorsalgia, unspecified: Secondary | ICD-10-CM

## 2018-05-14 DIAGNOSIS — K5909 Other constipation: Secondary | ICD-10-CM | POA: Insufficient documentation

## 2018-05-14 DIAGNOSIS — R339 Retention of urine, unspecified: Secondary | ICD-10-CM | POA: Insufficient documentation

## 2018-05-14 DIAGNOSIS — Z90710 Acquired absence of both cervix and uterus: Secondary | ICD-10-CM | POA: Insufficient documentation

## 2018-05-14 DIAGNOSIS — Z79899 Other long term (current) drug therapy: Secondary | ICD-10-CM | POA: Diagnosis not present

## 2018-05-14 DIAGNOSIS — R600 Localized edema: Secondary | ICD-10-CM | POA: Insufficient documentation

## 2018-05-14 DIAGNOSIS — R5381 Other malaise: Secondary | ICD-10-CM | POA: Insufficient documentation

## 2018-05-14 DIAGNOSIS — Z791 Long term (current) use of non-steroidal anti-inflammatories (NSAID): Secondary | ICD-10-CM | POA: Diagnosis not present

## 2018-05-14 DIAGNOSIS — Z90722 Acquired absence of ovaries, bilateral: Secondary | ICD-10-CM | POA: Insufficient documentation

## 2018-05-14 DIAGNOSIS — N390 Urinary tract infection, site not specified: Secondary | ICD-10-CM | POA: Insufficient documentation

## 2018-05-14 DIAGNOSIS — R5382 Chronic fatigue, unspecified: Secondary | ICD-10-CM | POA: Diagnosis not present

## 2018-05-14 MED ORDER — INFLUENZA VAC SPLIT QUAD 0.5 ML IM SUSY
0.5000 mL | PREFILLED_SYRINGE | Freq: Once | INTRAMUSCULAR | Status: AC
  Administered 2018-05-14: 0.5 mL via INTRAMUSCULAR

## 2018-05-14 MED ORDER — INFLUENZA VAC SPLIT QUAD 0.5 ML IM SUSY
PREFILLED_SYRINGE | INTRAMUSCULAR | Status: AC
  Filled 2018-05-14: qty 0.5

## 2018-05-14 NOTE — Progress Notes (Signed)
START OFF PATHWAY REGIMEN - Other Dx   [id number]:Cisplatin 40 mg/m2 IV D1 q7 Days + RT:   A cycle is every 7 days:     Cisplatin   **Always confirm dose/schedule in your pharmacy ordering system**  Patient Characteristics: Intent of Therapy: Curative Intent, Discussed with Patient 

## 2018-05-14 NOTE — Progress Notes (Signed)
[place] NOTE  Patient Care Team: Lucretia Kern, DO as PCP - General [facility] Medicine)  ASSESSMENT & PLAN:  Cervical cancer [facility]) I have reviewed the PET CT scan report and surgical report with the patient and family. We discussed the role of adjuvant treatment I recommend concurrent chemoradiation therapy with weekly cisplatin.  I will schedule chemo education class, blood work and weekly chemotherapy to start within the next 2 weeks. I will see her back next week for further follow-up.  We discussed the importance of preventive care and reviewed the vaccination programs. She does not have any prior allergic reactions to influenza vaccination. She agrees to proceed with influenza vaccination today and we will administer it today at the clinic.   Urinary tract infection She has urinary retention and recurrent urinary tract infection.  She is completing a course of antibiotic treatment. She will continue the same.  Will reassess next week  Other constipation We discussed the importance of aggressive laxative therapy   Orders Placed This Encounter  Procedures  . IR IMAGING GUIDED PORT INSERTION    Standing Status:   Future    Standing Expiration Date:   07/13/2019    Order Specific Question:   Reason for Exam (SYMPTOM  OR DIAGNOSIS REQUIRED)    Answer:   port for chemo to start 1/17    Order Specific Question:   Is the patient pregnant?    Answer:   No    Order Specific Question:   Preferred Imaging Location?    Answer:   [facility]  . Comprehensive metabolic panel    Standing Status:   Standing    Number of Occurrences:   22    Standing Expiration Date:   05/15/2019  . CBC with Differential/Platelet    Standing Status:   Standing    Number of Occurrences:   22    Standing Expiration Date:   05/15/2019  . Magnesium    Standing Status:   Standing    Number of Occurrences:   22    Standing Expiration Date:   05/15/2019     CHIEF  COMPLAINTS/PURPOSE OF CONSULTATION:  Cervical cancer, for further management  HISTORY OF PRESENTING ILLNESS:  Holly Pena 53 y.o. female is here because of recent diagnosis of cervical cancer.  She is accompanied by her husband and interpreter.  I have reviewed her chart and materials related to her cancer extensively and collaborated history with the patient. Summary of oncologic history is as follows:   Cervical cancer ([place])   02/20/2018 Initial Diagnosis    She presented with postmenopausal bleeding    03/10/2018 Pathology Results    Endometrium, biopsy - SQUAMOUS CELL CARCINOMA. - SEE COMMENT.    03/17/2018 Imaging    US pelvis: Endometrium measures 7 mm. In the setting of post-menopausal bleeding, endometrial sampling is indicated to exclude carcinoma    03/24/2018 Procedure    Pre-operative Diagnosis:  Suspect SCCa Cervix based on endometrial biopsy  Post-operative Diagnosis:  Stage IB1 (FIGO 2018) SCCa Cervix  Operation:  Exam under anesthesia Cervical biopsies  Operative Findings:  Anterior cervical lip with gross lesion from ~10-12:00. Remainder of cervix grossly normal. Estimate lesion to be <2cm. No vaginal or parametrial involvement.    03/30/2018 Imaging    CT scan of chest, abdomen and pelvis: 1. Isolated low-density structure lateral to the descending colon. Differential considerations include isolated peritoneal implant/metastasis, GI duplication/mesenteric cyst, or postoperative collection such as seroma (clinical history  only describes prior Caesarean section). Consider further evaluation with PET. 2. Otherwise, no evidence of metastatic disease in the chest, abdomen, or pelvis.    04/04/2018 PET scan    1. Prominent hypermetabolism within the cervix, consistent with known cervical cancer. No parametrial extension of tumor or abdominopelvic nodal metastatic disease. 2. The previously demonstrated low-density structure posterior to the descending colon  demonstrates no hypermetabolic activity and is likely an incidental duplication/mesenteric cyst. 3. Focal hypermetabolic activity within a single right neck lymph node (level 1B). This is unlikely to be related to the patient's cervical cancer. No hypermetabolism is seen within the pharyngeal mucosal space. ENT evaluation should be considered, especially if the patient has risk factors for head and neck malignancy.    04/28/2018 Surgery    Surgeon: Donaciano Eva  Pre-operative Diagnosis: stage IB2 cervical cancer  Post-operative Diagnosis: same  Operation: Robotic-assisted type III radical laparoscopic hysterectomy with bilateral salpingectomy and bilateral pelvic lymphadenectomy  Surgeon: Donaciano Eva  Operative Findings:  : 3.5cm tumor replacing the anterior and right cervix. No gross parametrial involvement.         Specimens: left and right pelvic nodes, uterus with cervix, bilateral tubes and upper vagina. Anterior vaginal margin with marking stitch at 12 o'clock of new true distal anterior vaginal margin         Complications:  None; patient tolerated the procedure well.             04/28/2018 Pathology Results    1. Lymph nodes, regional resection, right pelvic - EIGHT BENIGN LYMPH NODES (0/8). 2. Lymph nodes, regional resection, left pelvic - SEVEN BENIGN LYMPH NODES (0/7). 3. Uterus, cervix and bilateral fallopian tubes, upper vagina CERVIX: - INVASIVE POORLY DIFFERENTIATED SQUAMOUS CELL CARCINOMA, 4.5 X 2.6 X 1.0 CM. - MARGINS NOT INVOLVED. - LYMPHOVASCULAR INVOLVEMENT BY TUMOR. - METASTATIC CARCINOMA IN TWO OF SIX PARACERVICAL LYMPH NODES (2/6). BENIGN ENDOMETRIUM AND MYOMETRIUM SEE ONCOLOGY TABLE. 4. Vagina, biopsy, anterior vaginal margin - SQUAMOUS MUCOSA WITH SLIGHT INFLAMMATION. - NO EVIDENCE OF INVASIVE CARCINOMA. Microscopic Comment 3. UTERINE CERVIX: Resection  Procedure: Hysterectomy with right and left pelvic regional lymph nodes and  anterior vaginal margin. Tumor Size: 4.5 x 2.6 x 1.0 cm. Histologic Type: Squamous cell carcinoma Histologic Grade: Poorly differentiated Stromal Invasion: Yes Depth of stromal invasion (millimeters): 10 mm Horizontal extent longitudinal/length (if applicable#) (millimeters): 25 mm Horizontal extent circumferential/width (if applicable#) (millimeters): 45 mm Other Tissue/ Organ: No Margins: Free of tumor Lymphovascular: Present Regional Lymph Nodes: Two positive for tumor cells (select all that apply) Total Number of Lymph Nodes Examined: Twenty-one Number of Sentinel Nodes Examined (if applicable): 0 Pathologic Stage Classification (pTNM, AJCC 8th Edition): pT1b2, pN1 Ancillary Studies: N/A Representative Tumor Block: 3C, 3D, 3E, 41F, 3J and 3K Comment(s): The tumor is a poorly differentiated invasive squamous cell carcinoma which is up to 1.0 cm (10 mm) in thickness. There is lymphovascular space involvement within the wall of the cervix and in the paracervical connective tissue. The margins of the specimen are not involved by carcinoma (JDP:kh 04/29/18)    05/11/2018 Cancer Staging    Staging Pena: Cervix Uteri, AJCC 8th Edition - Pathologic stage from 05/11/2018: Stage III (pT3, pN1, cM0) - Signed by Heath Lark, MD on 05/11/2018    05/12/2018 Imaging    1. Soft tissue inflammation about the bladder could reflect cystitis. Would correlate with the patient's symptoms. 2. Small to moderate amount of free fluid within the pelvis, of uncertain significance. This may  simply be postoperative in nature, though if urine output from the Foley catheter decreases, further evaluation could be considered to exclude urine leak.    After surgery, she presented with urinary discomfort, abnormal vaginal discharge and malodorous urine.  She went to the emergency department recently with CT imaging and urine culture that came back positive for UTI. She was prescribed antibiotic treatment and felt  better. She has minimum vaginal discharge She denies significant abdominal pain.  Has some mild constipation. Her urinary symptoms has improved. She denies recent weight loss No recent fever or chills  MEDICAL HISTORY:  Past Medical History:  Diagnosis Date  . Cervical cancer ([place])   . Frequent headaches    due to lack of sleep  . Numbness    both hands and fingers  . PMB (postmenopausal bleeding)   . Seasonal allergies   . Squamous cell carcinoma in situ   . Tuberculosis    positive, CXR clear    SURGICAL HISTORY: Past Surgical History:  Procedure Laterality Date  . CESAREAN SECTION     x 2  . ENDOMETRIAL BIOPSY  03/10/2018  . lymph node surgery     at 11 months old, mother told her but not sure what surgery exactly  . PELVIC LYMPH NODE DISSECTION N/A 04/28/2018   Procedure: PELVIC LYMPH NODE DISSECTION;  Surgeon: Everitt Amber, MD;  Location: [facility];  Service: Gynecology;  Laterality: N/A;  . ROBOTIC ASSISTED TOTAL HYSTERECTOMY N/A 04/28/2018   Procedure: XI ROBOTIC ASSISTED RADICAL  HYSTERECTOMY;  Surgeon: Everitt Amber, MD;  Location: [facility];  Service: Gynecology;  Laterality: N/A;  . SALPINGOOPHORECTOMY Bilateral 04/28/2018   Procedure: Marilynn Rail SALPINGO OOPHORECTOMY;  Surgeon: Everitt Amber, MD;  Location: [facility];  Service: Gynecology;  Laterality: Bilateral;    SOCIAL HISTORY: Social History   Socioeconomic History  . Marital status: Married    Spouse name: Not on file  . Number of children: 2  . Years of education: 46  . Highest education level: Not on file  Occupational History  . Occupation: Ship broker    Comment: [employer]  . Financial resource strain: Not on file  . Food insecurity:    Worry: Not on file    Inability: Not on file  . Transportation needs:    Medical: Not on file    Non-medical: Not on file  Tobacco Use  . Smoking status: Never Smoker  . Smokeless tobacco: Never Used  Substance and Sexual Activity  . Alcohol use: Yes     Comment: occ  . Drug use: No  . Sexual activity: Yes    Partners: Male    Birth control/protection: Condom, Post-menopausal    Comment: married  Lifestyle  . Physical activity:    Days per week: Not on file    Minutes per session: Not on file  . Stress: Not on file  Relationships  . Social connections:    Talks on phone: Not on file    Gets together: Not on file    Attends religious service: Not on file    Active member of club or organization: Not on file    Attends meetings of clubs or organizations: Not on file    Relationship status: Not on file  . Intimate partner violence:    Fear of current or ex partner: Not on file    Emotionally abused: Not on file    Physically abused: Not on file    Forced sexual activity: Not on file  Other Topics Concern  . Not on file  Social History Narrative   Ms. Arrambide is From [place].  She lives with her husband & 2 daughters.    She attends college.    Drinks caffeine,.   Wears her seatbelt. Smoke detector in the home.    Exercises routinely.   Feels safe in her relationships.    FAMILY HISTORY: Family History  Problem Relation Age of Onset  . Healthy Mother   . Hypertension Mother   . Healthy Father   . Cancer Neg Hx   . Heart disease Neg Hx     ALLERGIES:  has No Known Allergies.  MEDICATIONS:  Current Outpatient Medications  Medication Sig Dispense Refill  . cephALEXin (KEFLEX) 500 MG capsule Take 1 capsule (500 mg total) by mouth 2 (two) times daily for 7 days. 14 capsule 0  . Cholecalciferol (VITAMIN D3) 125 MCG (5000 UT) TABS Take 5,000 Units by mouth daily.     Marland Kitchen erythromycin (E-MYCIN) 500 MG tablet Take 1 tablet (500 mg total) by mouth every 6 (six) hours. (Patient not taking: Reported on 05/12/2018) 27 tablet 0  . Glucosamine-Chondroitin (MOVE FREE PO) Take 1 tablet by mouth daily.     Marland Kitchen ibuprofen (ADVIL,MOTRIN) 600 MG tablet Take 1 tablet (600 mg total) by mouth every 6 (six) hours as needed for mild pain. 30 tablet 1  .  oxyCODONE (OXY IR/ROXICODONE) 5 MG immediate release tablet Take 1 tablet (5 mg total) by mouth every 4 (four) hours as needed for severe pain. (Patient not taking: Reported on 05/08/2018) 10 tablet 0  . senna-docusate (SENOKOT-S) 8.6-50 MG tablet Take 2 tablets by mouth at bedtime. Do not take if having diarrhea/loose stools 30 tablet 1   Current Facility-Administered Medications  Medication Dose Route Frequency Provider Last Rate Last Dose  . Influenza vac split quadrivalent PF (FLUARIX) injection 0.5 mL  0.5 mL Intramuscular Once Alvy Bimler, Chaise Passarella, MD        REVIEW OF SYSTEMS:   Constitutional: Denies fevers, chills or abnormal night sweats Eyes: Denies blurriness of vision, double vision or watery eyes Ears, nose, mouth, throat, and face: Denies mucositis or sore throat Respiratory: Denies cough, dyspnea or wheezes Cardiovascular: Denies palpitation, chest discomfort or lower extremity swelling Skin: Denies abnormal skin rashes Lymphatics: Denies new lymphadenopathy or easy bruising Neurological:Denies numbness, tingling or new weaknesses Behavioral/Psych: Mood is stable, no new changes  All other systems were reviewed with the patient and are negative.  PHYSICAL EXAMINATION: ECOG PERFORMANCE STATUS: 1 - Symptomatic but completely ambulatory  Vitals:   05/14/18 1435  BP: 119/63  Pulse: 82  Resp: 18  Temp: 98 F (36.7 C)  SpO2: 100%   Filed Weights   05/14/18 1435  Weight: 139 lb 6.4 oz (63.2 kg)    GENERAL:alert, no distress and comfortable SKIN: skin color, texture, turgor are normal, no rashes or significant lesions EYES: normal, conjunctiva are pink and non-injected, sclera clear OROPHARYNX:no exudate, no erythema and lips, buccal mucosa, and tongue normal  NECK: supple, thyroid normal size, non-tender, without nodularity LYMPH:  no palpable lymphadenopathy in the cervical, axillary or inguinal LUNGS: clear to auscultation and percussion with normal breathing  effort HEART: regular rate & rhythm and no murmurs and no lower extremity edema ABDOMEN:abdomen soft, non-tender and normal bowel sounds Musculoskeletal:no cyanosis of digits and no clubbing  PSYCH: alert & oriented x 3 with fluent speech NEURO: no focal motor/sensory deficits  LABORATORY DATA:  I have reviewed the data as listed  Lab Results  Component Value Date   WBC 5.1 05/12/2018   HGB 12.1 05/12/2018   HCT 36.9 05/12/2018   MCV 91.6 05/12/2018   PLT 297 05/12/2018   Recent Labs    03/03/18 1645 04/24/18 1106 04/29/18 0527 05/12/18 1913  NA 139 139 141 140  K 4.4 4.0 4.1 3.5  CL 104 106 108 106  CO2 28 25 27 25   GLUCOSE 86 94 116* 97  BUN 18 13 10 12   CREATININE 0.84 0.68 0.71 0.68  CALCIUM 9.7 9.4 8.6* 9.3  GFRNONAA  --  >60 >60 >60  GFRAA  --  >60 >60 >60  PROT 6.8 7.0  --   --   ALBUMIN 4.2 4.4  --   --   AST 13 21  --   --   ALT 7 14  --   --   ALKPHOS 52 52  --   --   BILITOT 0.4 0.8  --   --     RADIOGRAPHIC STUDIES: I have personally reviewed the radiological images as listed and agreed with the findings in the report. Ct Abdomen Pelvis W Contrast  Result Date: 05/12/2018 CLINICAL DATA:  Status post surgery for cervical cancer 2 days ago. Left lower quadrant abdominal pain and hematuria, acute onset. EXAM: CT ABDOMEN AND PELVIS WITH CONTRAST TECHNIQUE: Multidetector CT imaging of the abdomen and pelvis was performed using the standard protocol following bolus administration of intravenous contrast. CONTRAST:  122mL ISOVUE-300 IOPAMIDOL (ISOVUE-300) INJECTION 61% COMPARISON:  PET/CT performed 04/03/2018 FINDINGS: Lower chest: The visualized lung bases are grossly clear. The visualized portions of the mediastinum are unremarkable. Hepatobiliary: The liver is unremarkable in appearance. The gallbladder is unremarkable in appearance. The common bile duct remains normal in caliber. Pancreas: The pancreas is within normal limits. Spleen: The spleen is unremarkable  in appearance. Adrenals/Urinary Tract: The adrenal glands are unremarkable in appearance. The kidneys are within normal limits. There is no evidence of hydronephrosis. No renal or ureteral stones are identified. No perinephric stranding is seen. Stomach/Bowel: The stomach is unremarkable in appearance. The small bowel is within normal limits. The appendix is normal in caliber, without evidence of appendicitis. The colon is unremarkable in appearance. Vascular/Lymphatic: The abdominal aorta is unremarkable in appearance. The inferior vena cava is grossly unremarkable. No retroperitoneal lymphadenopathy is seen. No pelvic sidewall lymphadenopathy is identified. Reproductive: The bladder is decompressed, with a Foley catheter in place. Soft tissue inflammation about the bladder could reflect cystitis. Would correlate with the patient's symptoms. The patient is status post hysterectomy. No suspicious adnexal masses are seen. A small to moderate amount of free fluid is noted within the pelvis, of uncertain significance. Other: No additional soft tissue abnormalities are seen. Musculoskeletal: No acute osseous abnormalities are identified. The visualized musculature is unremarkable in appearance. IMPRESSION: 1. Soft tissue inflammation about the bladder could reflect cystitis. Would correlate with the patient's symptoms. 2. Small to moderate amount of free fluid within the pelvis, of uncertain significance. This may simply be postoperative in nature, though if urine output from the Foley catheter decreases, further evaluation could be considered to exclude urine leak. Electronically Signed   By: Garald Balding M.D.   On: 05/12/2018 22:29   Mm Digital Screening Bilateral  Result Date: 04/24/2018 CLINICAL DATA:  Screening. EXAM: DIGITAL SCREENING BILATERAL MAMMOGRAM WITH CAD COMPARISON:  None. ACR Breast Density Category b: There are scattered areas of fibroglandular density. FINDINGS: There are no findings suspicious  for malignancy. Images were processed  with CAD. IMPRESSION: No mammographic evidence of malignancy. A result letter of this screening mammogram will be mailed directly to the patient. RECOMMENDATION: Screening mammogram in one year. (Code:[id number]) BI-RADS CATEGORY  1: Negative. Electronically Signed   By: Lovey Newcomer M.D.   On: 04/24/2018 15:39    I spent 55 minutes counseling the patient face to face. The total time spent in the appointment was 60 minutes and more than 50% was on counseling.  All questions were answered. The patient knows to call the clinic with any problems, questions or concerns.  Heath Lark, MD 05/14/2018 3:33 PM

## 2018-05-14 NOTE — Assessment & Plan Note (Signed)
She has urinary retention and recurrent urinary tract infection.  She is completing a course of antibiotic treatment. She will continue the same.  Will reassess next week

## 2018-05-14 NOTE — Assessment & Plan Note (Signed)
We discussed the importance of aggressive laxative therapy 

## 2018-05-14 NOTE — Telephone Encounter (Signed)
Gave avs and calendar ° °

## 2018-05-14 NOTE — Assessment & Plan Note (Signed)
I have reviewed the PET CT scan report and surgical report with the patient and family. We discussed the role of adjuvant treatment I recommend concurrent chemoradiation therapy with weekly cisplatin.  I will schedule chemo education class, blood work and weekly chemotherapy to start within the next 2 weeks. I will see her back next week for further follow-up.  We discussed the importance of preventive care and reviewed the vaccination programs. She does not have any prior allergic reactions to influenza vaccination. She agrees to proceed with influenza vaccination today and we will administer it today at the clinic.

## 2018-05-15 LAB — URINE CULTURE: Culture: >=100000 — AB

## 2018-05-16 ENCOUNTER — Telehealth: Payer: Self-pay

## 2018-05-16 NOTE — Telephone Encounter (Signed)
Post ED Visit - Positive Culture Follow-up  Culture report reviewed by antimicrobial stewardship pharmacist:  []  Elenor Quinones, Pharm.D. []  Heide Guile, Pharm.D., BCPS AQ-ID []  Parks Neptune, Pharm.D., BCPS []  Alycia Rossetti, Pharm.D., BCPS []  Hayden, Pharm.D., BCPS, AAHIVP []  Legrand Como, Pharm.D., BCPS, AAHIVP []  Salome Arnt, PharmD, BCPS []  Johnnette Gourd, PharmD, BCPS []  Hughes Better, PharmD, BCPS []  Leeroy Cha, PharmD [facility] [place] Pharm D Positive urine culture Treated with Cephalexin, organism sensitive to the same and no further patient follow-up is required at this time.  Genia Del 05/16/2018, 10:11 AM

## 2018-05-18 ENCOUNTER — Encounter: Payer: Self-pay | Admitting: Gynecologic Oncology

## 2018-05-18 NOTE — Telephone Encounter (Signed)
The pain is in her right groin. Pain level is a 3/10. The right groin is slightly swollen. No redness noted. Afebrile. Groin sore when she bends to stand.  No pain in legs with weight bearing. Pt has urine leg bag on right leg when she uses it. Reviewed with Dr. Denman George. Told Holly Pena that Dr. Denman George said that with the lymph nodes removed that she could have discomfort in the groin.  She can apply ice/heat for comfort.  Use Ibuprofen 600 mg tabs prn. Place leg bag on left leg when using it. The vaginal drainage is normal after the surgery and can have an unpleasant odor. She is to call if she has a temp of 101.0 or higher. Swelling in legs and or pain when she bears weight. If vaginal discharge increases like a period, she needs to call the office. Pt verbalized understanding.

## 2018-05-20 ENCOUNTER — Encounter: Payer: Self-pay | Admitting: Hematology and Oncology

## 2018-05-20 ENCOUNTER — Telehealth: Payer: Self-pay | Admitting: Hematology and Oncology

## 2018-05-20 ENCOUNTER — Inpatient Hospital Stay (HOSPITAL_BASED_OUTPATIENT_CLINIC_OR_DEPARTMENT_OTHER): Payer: BLUE CROSS/BLUE SHIELD | Admitting: Gynecologic Oncology

## 2018-05-20 ENCOUNTER — Encounter: Payer: Self-pay | Admitting: Gynecologic Oncology

## 2018-05-20 ENCOUNTER — Inpatient Hospital Stay (HOSPITAL_BASED_OUTPATIENT_CLINIC_OR_DEPARTMENT_OTHER): Payer: BLUE CROSS/BLUE SHIELD | Admitting: Hematology and Oncology

## 2018-05-20 VITALS — BP 106/65 | HR 74 | Temp 98.0°F | Resp 18 | Ht 63.0 in | Wt 140.0 lb

## 2018-05-20 DIAGNOSIS — Z791 Long term (current) use of non-steroidal anti-inflammatories (NSAID): Secondary | ICD-10-CM

## 2018-05-20 DIAGNOSIS — Z90722 Acquired absence of ovaries, bilateral: Secondary | ICD-10-CM

## 2018-05-20 DIAGNOSIS — R5381 Other malaise: Secondary | ICD-10-CM | POA: Diagnosis not present

## 2018-05-20 DIAGNOSIS — Z79899 Other long term (current) drug therapy: Secondary | ICD-10-CM

## 2018-05-20 DIAGNOSIS — C539 Malignant neoplasm of cervix uteri, unspecified: Secondary | ICD-10-CM

## 2018-05-20 DIAGNOSIS — C531 Malignant neoplasm of exocervix: Secondary | ICD-10-CM

## 2018-05-20 DIAGNOSIS — Z90710 Acquired absence of both cervix and uterus: Secondary | ICD-10-CM

## 2018-05-20 DIAGNOSIS — K5909 Other constipation: Secondary | ICD-10-CM

## 2018-05-20 DIAGNOSIS — Z466 Encounter for fitting and adjustment of urinary device: Secondary | ICD-10-CM

## 2018-05-20 MED ORDER — LIDOCAINE-PRILOCAINE 2.5-2.5 % EX CREA: TOPICAL_CREAM | CUTANEOUS | 3 refills | Status: DC

## 2018-05-20 MED ORDER — ONDANSETRON HCL 8 MG PO TABS: 8.0000 mg | ORAL_TABLET | Freq: Three times a day (TID) | ORAL | 1 refills | Status: DC | PRN

## 2018-05-20 MED ORDER — PROCHLORPERAZINE MALEATE 10 MG PO TABS: 10.0000 mg | ORAL_TABLET | Freq: Four times a day (QID) | ORAL | 1 refills | Status: DC | PRN

## 2018-05-20 NOTE — Progress Notes (Signed)
GYN Location of Tumor / Histology: cervical cancer  Eric Form presented with symptoms of: She went through menopause ~2017. On February 20, 2018 she noted post-menopausal bleeding that lasted ~ 2 weeks (intermittent). She presented to her PCP who referred her on to Dr. Elly Modena. On exam she was noted to have a bulky and friable cervix. A Pap and EMB were performed.   A TVUS was ordered and done 03/17/18 revealing a normal uterus with 49mm EM lining. No adnexal masses.  Pap and EMB revealed squamous cell CA.  She is thus referred for management and recommendations.  Her last pelvic/Pap was at the time of her last pregnancy ~16 years ago  Biopsies revealed: 04/28/18:  Diagnosis 1. Lymph nodes, regional resection, right pelvic - EIGHT BENIGN LYMPH NODES (0/8). 2. Lymph nodes, regional resection, left pelvic - SEVEN BENIGN LYMPH NODES (0/7). 3. Uterus, cervix and bilateral fallopian tubes, upper vagina CERVIX: - INVASIVE POORLY DIFFERENTIATED SQUAMOUS CELL CARCINOMA, 4.5 X 2.6 X 1.0 CM. - MARGINS NOT INVOLVED. - LYMPHOVASCULAR INVOLVEMENT BY TUMOR. - METASTATIC CARCINOMA IN TWO OF SIX PARACERVICAL LYMPH NODES (2/6). BENIGN ENDOMETRIUM AND MYOMETRIUM SEE ONCOLOGY TABLE. 4. Vagina, biopsy, anterior vaginal margin - SQUAMOUS MUCOSA WITH SLIGHT INFLAMMATION. - NO EVIDENCE OF INVASIVE CARCINOMA.  03/24/18:  Diagnosis 1. Cervix, biopsy, 12 o'clock - INVASIVE MODERATELY DIFFERENTIATED SQUAMOUS CELL CARCINOMA. SEE NOTE. 2. Cervix, biopsy, 3 o'clock - HIGH GRADE SQUAMOUS INTRAEPITHELIAL LESION (CIN-III, HIGH GRADE DYSPLASIA). SEE NOTE. 3. Cervix, biopsy, 9 o'clock - INVASIVE MODERATELY DIFFERENTIATED SQUAMOUS CELL CARCINOMA. SEE NOTE. 4. Cervix, biopsy, 6 o'clock - HIGH GRADE SQUAMOUS INTRAEPITHELIAL LESION (CIN-III, HIGH GRADE DYSPLASIA). SEE NOTE.  Past/Anticipated interventions by Gyn/Onc surgery, if any: 04/28/18:  Operation: Robotic-assisted type III radical laparoscopic hysterectomy with  bilateral salpingectomy and bilateral pelvic lymphadenectomy  Surgeon: Donaciano Eva  Past/Anticipated interventions by medical oncology, if any: Per Dr. Alvy Bimler 05/20/18: ASSESSMENT & PLAN:  Cervical cancer ([place]) We discussed the role of chemotherapy. The intent is of curative intent.  We discussed some of the risks, benefits, side-effects of cisplatin and its role as chemo sensitizing agent. The plan for weekly cisplatin for x 5-6 doses along with radiation treatment.  Some of the short term side-effects included, though not limited to, including weight loss, life threatening infections, risk of allergic reactions, need for transfusions of blood products, nausea, vomiting, change in bowel habits, loss of hair, admission to hospital for various reasons, and risks of death.   Long term side-effects are also discussed including risks of infertility, permanent damage to nerve function, hearing loss, chronic fatigue, kidney damage with possibility needing hemodialysis, and rare secondary malignancy including bone marrow disorders.  The patient is aware that the response rates discussed earlier is not guaranteed.  After a long discussion, patient made an informed decision to proceed with the prescribed plan of care.   Patient education material was dispensed. She will undergo chemo education class and port placement     Physical debility She is quite debilitated from recent surgery I recommend consultation to see physical therapy and rehab and she agreed  Weight changes, if any: None noticeable  Bowel/Bladder complaints, if any: difficult to urinate, uses an I&O to check to post void residual. Bowel: constipation that is relieved with stool softener.    Nausea/Vomiting, if any: No. No abdominal bloating  Pain issues, if any:  Pt reports pain in RIGHT mid-back and attributes pain to kidnes.   SAFETY ISSUES:  Prior radiation?No  Pacemaker/ICD? No  Possible  current  pregnancy? No, pt is post-surgical  Is the patient on methotrexate? No  Current Complaints / other details:  Pt presents today for initial consult with Dr. Sondra Come for Radiation Oncology. Pt is accompanied by husband and interpreter.   BP 117/79 (BP Location: Left Arm, Patient Position: Sitting)   Pulse 66   Temp 98.4 F (36.9 C) (Oral)   Resp 18   Ht 5\' 3"  (1.6 m)   Wt 142 lb 12.8 oz (64.8 kg)   LMP 02/20/2018 Comment: no period in 2 years  SpO2 100%   BMI 25.30 kg/m   Wt Readings from Last 3 Encounters:  05/25/18 142 lb 12.8 oz (64.8 kg)  05/20/18 140 lb (63.5 kg)  05/14/18 139 lb 6.4 oz (63.2 kg)   Loma Sousa, RN BSN

## 2018-05-20 NOTE — Patient Instructions (Signed)
Please call our office if you start having fullness, pressure, pain in the lower abdomen, decreased urine output.  Follow up as planned or sooner if needed

## 2018-05-20 NOTE — Progress Notes (Signed)
S: Patient presents to the office today for second voiding trial and foley removal s/p radical hysterectomy. Reports doing well at home.  States her urine has stayed clear and yellow since her last ED visit where she had a CT due to change in urine color with concern for possible bladder or ureteral injury. No fever or chills reported.  Tolerating diet with no nausea or emesis.  Translator in the room.  Husband waiting in the lobby. No other concerns voiced.  O: Alert, oriented x 3. Foley to leg bag with clear, yellow urine present.  No BLE edema present.  210 cc of sterile normal saline instilled in the bladder via foley per Dr. Denman George.  Patient tolerated well and felt strong urge to urinate.  Catheter was removed without difficulty and pt was assisted to the bathroom. She was not able to urinate initially and ambulated in the halls for 15 minutes.  After 20 minutes, she was able to urinate 200 cc of clear, yellow urine.  Discussed with Dr. Denman George. Patient passed trial.   A: Stage IIIC cervical cancer s/p radical hysterectomy on 04/28/18.    P: Voiding trial successful.  Reportable signs and symptoms reviewed and after hours contact information discussed.

## 2018-05-20 NOTE — Assessment & Plan Note (Signed)
We discussed the role of chemotherapy. The intent is of curative intent.  We discussed some of the risks, benefits, side-effects of cisplatin and its role as chemo sensitizing agent. The plan for weekly cisplatin for x 5-6 doses along with radiation treatment.  Some of the short term side-effects included, though not limited to, including weight loss, life threatening infections, risk of allergic reactions, need for transfusions of blood products, nausea, vomiting, change in bowel habits, loss of hair, admission to hospital for various reasons, and risks of death.   Long term side-effects are also discussed including risks of infertility, permanent damage to nerve function, hearing loss, chronic fatigue, kidney damage with possibility needing hemodialysis, and rare secondary malignancy including bone marrow disorders.  The patient is aware that the response rates discussed earlier is not guaranteed.  After a long discussion, patient made an informed decision to proceed with the prescribed plan of care.   Patient education material was dispensed. She will undergo chemo education class and port placement

## 2018-05-20 NOTE — Assessment & Plan Note (Signed)
She is quite debilitated from recent surgery I recommend consultation to see physical therapy and rehab and she agreed

## 2018-05-20 NOTE — Progress Notes (Signed)
[facility] OFFICE PROGRESS NOTE  Patient Care Team: Lucretia Kern, DO as PCP - General (Family Medicine)  ASSESSMENT & PLAN:  Cervical cancer [facility]) We discussed the role of chemotherapy. The intent is of curative intent.  We discussed some of the risks, benefits, side-effects of cisplatin and its role as chemo sensitizing agent. The plan for weekly cisplatin for x 5-6 doses along with radiation treatment.  Some of the short term side-effects included, though not limited to, including weight loss, life threatening infections, risk of allergic reactions, need for transfusions of blood products, nausea, vomiting, change in bowel habits, loss of hair, admission to hospital for various reasons, and risks of death.   Long term side-effects are also discussed including risks of infertility, permanent damage to nerve function, hearing loss, chronic fatigue, kidney damage with possibility needing hemodialysis, and rare secondary malignancy including bone marrow disorders.  The patient is aware that the response rates discussed earlier is not guaranteed.  After a long discussion, patient made an informed decision to proceed with the prescribed plan of care.   Patient education material was dispensed. She will undergo chemo education class and port placement     Physical debility She is quite debilitated from recent surgery I recommend consultation to see physical therapy and rehab and she agreed   Orders Placed This Encounter  Procedures  . Ambulatory referral to Physical Therapy    Referral Priority:   Routine    Referral Type:   Physical Medicine    Referral Reason:   Specialty Services Required    Requested Specialty:   Physical Therapy    Number of Visits Requested:   1    INTERVAL HISTORY: Please see below for problem oriented charting. She returns with her husband for further follow-up.  [place] interpreter is also present. She complained of some urinary  discomfort and right leg discomfort from Foley catheter therapy She denies nausea or constipation She has some mild leg discomfort on her right thigh  SUMMARY OF ONCOLOGIC HISTORY:   Cervical cancer ([place])   02/20/2018 Initial Diagnosis    She presented with postmenopausal bleeding    03/10/2018 Pathology Results    Endometrium, biopsy - SQUAMOUS CELL CARCINOMA. - SEE COMMENT.    03/17/2018 Imaging    US pelvis: Endometrium measures 7 mm. In the setting of post-menopausal bleeding, endometrial sampling is indicated to exclude carcinoma    03/24/2018 Procedure    Pre-operative Diagnosis:  Suspect SCCa Cervix based on endometrial biopsy  Post-operative Diagnosis:  Stage IB1 (FIGO 2018) SCCa Cervix  Operation:  Exam under anesthesia Cervical biopsies  Operative Findings:  Anterior cervical lip with gross lesion from ~10-12:00. Remainder of cervix grossly normal. Estimate lesion to be <2cm. No vaginal or parametrial involvement.    03/30/2018 Imaging    CT scan of chest, abdomen and pelvis: 1. Isolated low-density structure lateral to the descending colon. Differential considerations include isolated peritoneal implant/metastasis, GI duplication/mesenteric cyst, or postoperative collection such as seroma (clinical history only describes prior Caesarean section). Consider further evaluation with PET. 2. Otherwise, no evidence of metastatic disease in the chest, abdomen, or pelvis.    04/04/2018 PET scan    1. Prominent hypermetabolism within the cervix, consistent with known cervical cancer. No parametrial extension of tumor or abdominopelvic nodal metastatic disease. 2. The previously demonstrated low-density structure posterior to the descending colon demonstrates no hypermetabolic activity and is likely an incidental duplication/mesenteric cyst. 3. Focal hypermetabolic activity within a single right neck  lymph node (level 1B). This is unlikely to be related to the patient's  cervical cancer. No hypermetabolism is seen within the pharyngeal mucosal space. ENT evaluation should be considered, especially if the patient has risk factors for head and neck malignancy.    04/28/2018 Surgery    Surgeon: Donaciano Eva  Pre-operative Diagnosis: stage IB2 cervical cancer  Post-operative Diagnosis: same  Operation: Robotic-assisted type III radical laparoscopic hysterectomy with bilateral salpingectomy and bilateral pelvic lymphadenectomy  Surgeon: Donaciano Eva  Operative Findings:  : 3.5cm tumor replacing the anterior and right cervix. No gross parametrial involvement.         Specimens: left and right pelvic nodes, uterus with cervix, bilateral tubes and upper vagina. Anterior vaginal margin with marking stitch at 12 o'clock of new true distal anterior vaginal margin         Complications:  None; patient tolerated the procedure well.             04/28/2018 Pathology Results    1. Lymph nodes, regional resection, right pelvic - EIGHT BENIGN LYMPH NODES (0/8). 2. Lymph nodes, regional resection, left pelvic - SEVEN BENIGN LYMPH NODES (0/7). 3. Uterus, cervix and bilateral fallopian tubes, upper vagina CERVIX: - INVASIVE POORLY DIFFERENTIATED SQUAMOUS CELL CARCINOMA, 4.5 X 2.6 X 1.0 CM. - MARGINS NOT INVOLVED. - LYMPHOVASCULAR INVOLVEMENT BY TUMOR. - METASTATIC CARCINOMA IN TWO OF SIX PARACERVICAL LYMPH NODES (2/6). BENIGN ENDOMETRIUM AND MYOMETRIUM SEE ONCOLOGY TABLE. 4. Vagina, biopsy, anterior vaginal margin - SQUAMOUS MUCOSA WITH SLIGHT INFLAMMATION. - NO EVIDENCE OF INVASIVE CARCINOMA. Microscopic Comment 3. UTERINE CERVIX: Resection  Procedure: Hysterectomy with right and left pelvic regional lymph nodes and anterior vaginal margin. Tumor Size: 4.5 x 2.6 x 1.0 cm. Histologic Type: Squamous cell carcinoma Histologic Grade: Poorly differentiated Stromal Invasion: Yes Depth of stromal invasion (millimeters): 10 mm Horizontal extent  longitudinal/length (if applicable#) (millimeters): 25 mm Horizontal extent circumferential/width (if applicable#) (millimeters): 45 mm Other Tissue/ Organ: No Margins: Free of tumor Lymphovascular: Present Regional Lymph Nodes: Two positive for tumor cells (select all that apply) Total Number of Lymph Nodes Examined: Twenty-one Number of Sentinel Nodes Examined (if applicable): 0 Pathologic Stage Classification (pTNM, AJCC 8th Edition): pT1b2, pN1 Ancillary Studies: N/A Representative Tumor Block: 3C, 3D, 3E, 75F, 3J and 3K Comment(s): The tumor is a poorly differentiated invasive squamous cell carcinoma which is up to 1.0 cm (10 mm) in thickness. There is lymphovascular space involvement within the wall of the cervix and in the paracervical connective tissue. The margins of the specimen are not involved by carcinoma (JDP:kh 04/29/18)    05/11/2018 Cancer Staging    Staging form: Cervix Uteri, AJCC 8th Edition - Pathologic stage from 05/11/2018: Stage III (pT3, pN1, cM0) - Signed by Heath Lark, MD on 05/11/2018    05/12/2018 Imaging    1. Soft tissue inflammation about the bladder could reflect cystitis. Would correlate with the patient's symptoms. 2. Small to moderate amount of free fluid within the pelvis, of uncertain significance. This may simply be postoperative in nature, though if urine output from the Foley catheter decreases, further evaluation could be considered to exclude urine leak.     REVIEW OF SYSTEMS:   Constitutional: Denies fevers, chills or abnormal weight loss Eyes: Denies blurriness of vision Ears, nose, mouth, throat, and face: Denies mucositis or sore throat Respiratory: Denies cough, dyspnea or wheezes Cardiovascular: Denies palpitation, chest discomfort or lower extremity swelling Gastrointestinal:  Denies nausea, heartburn or change in bowel habits Skin: Denies abnormal  skin rashes Lymphatics: Denies new lymphadenopathy or easy bruising Neurological:Denies  numbness, tingling or new weaknesses Behavioral/Psych: Mood is stable, no new changes  All other systems were reviewed with the patient and are negative.  I have reviewed the past medical history, past surgical history, social history and family history with the patient and they are unchanged from previous note.  ALLERGIES:  has No Known Allergies.  MEDICATIONS:  Current Outpatient Medications  Medication Sig Dispense Refill  . Cholecalciferol (VITAMIN D3) 125 MCG (5000 UT) TABS Take 5,000 Units by mouth daily.     . Glucosamine-Chondroitin (MOVE FREE PO) Take 1 tablet by mouth daily.     Marland Kitchen ibuprofen (ADVIL,MOTRIN) 600 MG tablet Take 1 tablet (600 mg total) by mouth every 6 (six) hours as needed for mild pain. 30 tablet 1  . lidocaine-prilocaine (EMLA) cream Apply to affected area once 30 g 3  . ondansetron (ZOFRAN) 8 MG tablet Take 1 tablet (8 mg total) by mouth every 8 (eight) hours as needed. Start on the third day after chemotherapy. 30 tablet 1  . prochlorperazine (COMPAZINE) 10 MG tablet Take 1 tablet (10 mg total) by mouth every 6 (six) hours as needed (Nausea or vomiting). 30 tablet 1  . senna-docusate (SENOKOT-S) 8.6-50 MG tablet Take 2 tablets by mouth at bedtime. Do not take if having diarrhea/loose stools 30 tablet 1   No current facility-administered medications for this visit.     PHYSICAL EXAMINATION: ECOG PERFORMANCE STATUS: 1 - Symptomatic but completely ambulatory  Vitals:   05/20/18 1018  BP: 106/65  Pulse: 74  Resp: 18  Temp: 98 F (36.7 C)  SpO2: 99%   Filed Weights   05/20/18 1018  Weight: 140 lb (63.5 kg)    GENERAL:alert, no distress and comfortable  Musculoskeletal:no cyanosis of digits and no clubbing  NEURO: alert & oriented x 3 with fluent speech, no focal motor/sensory deficits  LABORATORY DATA:  I have reviewed the data as listed    Component Value Date/Time   NA 140 05/12/2018 1913   K 3.5 05/12/2018 1913   CL 106 05/12/2018 1913   CO2  25 05/12/2018 1913   GLUCOSE 97 05/12/2018 1913   BUN 12 05/12/2018 1913   CREATININE 0.68 05/12/2018 1913   CALCIUM 9.3 05/12/2018 1913   PROT 7.0 04/24/2018 1106   ALBUMIN 4.4 04/24/2018 1106   AST 21 04/24/2018 1106   ALT 14 04/24/2018 1106   ALKPHOS 52 04/24/2018 1106   BILITOT 0.8 04/24/2018 1106   GFRNONAA >60 05/12/2018 1913   GFRAA >60 05/12/2018 1913    No results found for: SPEP, UPEP  Lab Results  Component Value Date   WBC 5.1 05/12/2018   NEUTROABS 3.4 05/12/2018   HGB 12.1 05/12/2018   HCT 36.9 05/12/2018   MCV 91.6 05/12/2018   PLT 297 05/12/2018      Chemistry      Component Value Date/Time   NA 140 05/12/2018 1913   K 3.5 05/12/2018 1913   CL 106 05/12/2018 1913   CO2 25 05/12/2018 1913   BUN 12 05/12/2018 1913   CREATININE 0.68 05/12/2018 1913      Component Value Date/Time   CALCIUM 9.3 05/12/2018 1913   ALKPHOS 52 04/24/2018 1106   AST 21 04/24/2018 1106   ALT 14 04/24/2018 1106   BILITOT 0.8 04/24/2018 1106       RADIOGRAPHIC STUDIES: I have personally reviewed the radiological images as listed and agreed with the findings in the report.  Ct Abdomen Pelvis W Contrast  Result Date: 05/12/2018 CLINICAL DATA:  Status post surgery for cervical cancer 2 days ago. Left lower quadrant abdominal pain and hematuria, acute onset. EXAM: CT ABDOMEN AND PELVIS WITH CONTRAST TECHNIQUE: Multidetector CT imaging of the abdomen and pelvis was performed using the standard protocol following bolus administration of intravenous contrast. CONTRAST:  122mL ISOVUE-300 IOPAMIDOL (ISOVUE-300) INJECTION 61% COMPARISON:  PET/CT performed 04/03/2018 FINDINGS: Lower chest: The visualized lung bases are grossly clear. The visualized portions of the mediastinum are unremarkable. Hepatobiliary: The liver is unremarkable in appearance. The gallbladder is unremarkable in appearance. The common bile duct remains normal in caliber. Pancreas: The pancreas is within normal limits.  Spleen: The spleen is unremarkable in appearance. Adrenals/Urinary Tract: The adrenal glands are unremarkable in appearance. The kidneys are within normal limits. There is no evidence of hydronephrosis. No renal or ureteral stones are identified. No perinephric stranding is seen. Stomach/Bowel: The stomach is unremarkable in appearance. The small bowel is within normal limits. The appendix is normal in caliber, without evidence of appendicitis. The colon is unremarkable in appearance. Vascular/Lymphatic: The abdominal aorta is unremarkable in appearance. The inferior vena cava is grossly unremarkable. No retroperitoneal lymphadenopathy is seen. No pelvic sidewall lymphadenopathy is identified. Reproductive: The bladder is decompressed, with a Foley catheter in place. Soft tissue inflammation about the bladder could reflect cystitis. Would correlate with the patient's symptoms. The patient is status post hysterectomy. No suspicious adnexal masses are seen. A small to moderate amount of free fluid is noted within the pelvis, of uncertain significance. Other: No additional soft tissue abnormalities are seen. Musculoskeletal: No acute osseous abnormalities are identified. The visualized musculature is unremarkable in appearance. IMPRESSION: 1. Soft tissue inflammation about the bladder could reflect cystitis. Would correlate with the patient's symptoms. 2. Small to moderate amount of free fluid within the pelvis, of uncertain significance. This may simply be postoperative in nature, though if urine output from the Foley catheter decreases, further evaluation could be considered to exclude urine leak. Electronically Signed   By: Garald Balding M.D.   On: 05/12/2018 22:29   Mm Digital Screening Bilateral  Result Date: 04/24/2018 CLINICAL DATA:  Screening. EXAM: DIGITAL SCREENING BILATERAL MAMMOGRAM WITH CAD COMPARISON:  None. ACR Breast Density Category b: There are scattered areas of fibroglandular density.  FINDINGS: There are no findings suspicious for malignancy. Images were processed with CAD. IMPRESSION: No mammographic evidence of malignancy. A result letter of this screening mammogram will be mailed directly to the patient. RECOMMENDATION: Screening mammogram in one year. (Code:SM-B-01Y) BI-RADS CATEGORY  1: Negative. Electronically Signed   By: Lovey Newcomer M.D.   On: 04/24/2018 15:39    All questions were answered. The patient knows to call the clinic with any problems, questions or concerns. No barriers to learning was detected.  I spent 25 minutes counseling the patient face to face. The total time spent in the appointment was 30 minutes and more than 50% was on counseling and review of test results  Heath Lark, MD 05/20/2018 12:18 PM

## 2018-05-20 NOTE — Telephone Encounter (Signed)
Per 1/8 no new orders

## 2018-05-21 ENCOUNTER — Inpatient Hospital Stay: Payer: BLUE CROSS/BLUE SHIELD

## 2018-05-21 ENCOUNTER — Inpatient Hospital Stay (HOSPITAL_BASED_OUTPATIENT_CLINIC_OR_DEPARTMENT_OTHER): Payer: BLUE CROSS/BLUE SHIELD | Admitting: Gynecologic Oncology

## 2018-05-21 ENCOUNTER — Encounter: Payer: Self-pay | Admitting: Oncology

## 2018-05-21 ENCOUNTER — Encounter: Payer: Self-pay | Admitting: Gynecologic Oncology

## 2018-05-21 DIAGNOSIS — R339 Retention of urine, unspecified: Secondary | ICD-10-CM

## 2018-05-21 DIAGNOSIS — M549 Dorsalgia, unspecified: Secondary | ICD-10-CM

## 2018-05-21 DIAGNOSIS — C539 Malignant neoplasm of cervix uteri, unspecified: Secondary | ICD-10-CM

## 2018-05-21 DIAGNOSIS — Z90722 Acquired absence of ovaries, bilateral: Secondary | ICD-10-CM

## 2018-05-21 DIAGNOSIS — Z90710 Acquired absence of both cervix and uterus: Secondary | ICD-10-CM

## 2018-05-21 LAB — URINALYSIS, COMPLETE (UACMP) WITH MICROSCOPIC
Bacteria, UA: NONE SEEN
Bilirubin Urine: NEGATIVE
Glucose, UA: NEGATIVE mg/dL
Ketones, ur: NEGATIVE mg/dL
Leukocytes, UA: NEGATIVE
NITRITE: NEGATIVE
Protein, ur: NEGATIVE mg/dL
Specific Gravity, Urine: 1.006 (ref 1.005–1.030)
pH: 7 (ref 5.0–8.0)

## 2018-05-21 NOTE — Patient Instructions (Signed)
Plan to self cath at least three times a day to completely empty your bladder.  The goal is to have you start urinating more and less to come out when you catheterize.     Clean Intermittent Catheterization, Female  Clean intermittent catheterization (CIC) is a procedure to remove urine from the bladder by placing a small, flexible tube (catheter) into the bladder though the urethra. The urethra is a tube in the body that carries urine from the bladder out of the body. CIC may be done when:  You cannot completely empty your bladder on your own. This may be due to a blockage in the bladder or urethra.  Your bladder leaks urine. This may happen when the muscles or nerves near the bladder are not working normally, so the bladder overflows. Your health care provider will show you how to perform CIC and will help you to feel comfortable performing this procedure at home. Your health care provider will also help you to get the home care supplies that are needed for this procedure. What supplies will I need?  Germ-free (sterile), water-based lubricant.  A container for urine collection. You may also use the toilet to dispose of urine from the catheter.  A catheter. Your health care provider will determine the best size for you.  Sterile gloves.  Sterile gauze.  Medicated sterile swabs. How do I perform the procedure? Most people need CIC at least 4 times per dayto adequately empty the bladder. Your health care provider will tell you how often you should perform CIC. To perform CIC, follow these steps: 1. Wash your hands with soap and water. If soap and water are not available, use hand sanitizer. 2. Prepare the supplies that you will use during the procedure. Open the catheter pack, the lubricant. 3. Get in a comfortable position. It may be helpful to use a handheld mirror to look at the opening of your urethra. Possible positions include: ? Sitting on a toilet, a chair, or the edge of a  bed. ? Standing next to a toilet with one foot on the toilet rim. ? Lying down with your head raised on pillows and your knees pointing to the ceiling. 4. If you are using a urine collection container, position it between your legs. 5. Urinate, if you are able. 6. Put on gloves. 7. Apply lubricant to about 2 inches (5 cm) of the tip of the catheter. 8. Set the catheter down on a clean, dry surface within reach. 9. Gently spread the folds of skin around your vagina (labia) with your non-dominant hand. For example, if you are right-handed, use your left hand to do this. With your other hand, clean the area around your urethra with medicated sterile swabs as told by your health care provider. 10. While keeping your labia spread apart, slowly insert the lubricated catheter 2-3 inches (5-8 cm) straight into your urethra until urine flows freely. Allow urine to drain into the toilet or the urine collection container. 11. When urine starts to flow freely, insert the catheter 1 inch (3 cm) more. 12. When urine stops flowing, slowly remove the catheter. 13. Note the color, amount, and odor of the urine. 14. Clean your labia using soap and water. 15. Wash your hands with soap and water. 16. Follow package instructions about how to clean the catheter after each use. What should I do at home? How Often Should I Perform CIC?  Do CIC to empty your bladder every 4-6 hours or as often  as told by your health care provider.  If you have symptoms of too much urine in your bladder (overdistension) and you are not able to urinate, perform CIC. Symptoms of overdistension may include: ? Restlessness. ? Sweating or chills. ? Headache. ? Flushedor pale skin. ? Cold limbs. ? Bloated lower abdomen. What Are Some Steps That I Can Take to Avoid Problems?  Drink enough fluid to keep your urine clear or pale yellow.  Avoid caffeine. Caffeine may make you urinate more frequently and more urgently.  Dispose of a  multiple-use catheter when it becomes dry, brittle, or cloudy. This usually happens after you use the catheter for 1 week.  Take over-the-counter and prescription medicines only as told by your health care provider.  Keep all follow-up visits as told by your health care provider. This is important. Contact a health care provider if:  You have difficulty performing CIC.  You have urine leaking during CIC.  You have: ? Dark or cloudy urine. ? Blood in your urine or in your catheter. ? A change in the smell of your urine or discharge. ? A burning feeling while you urinate.  You feel nauseous or you vomit.  You have pain in your abdomen, your back, or your sides below your ribs.  You have swelling or redness around the opening of your urethra.  You develop a rash or sores on your skin. Get help right away if:  You have a fever.  You have symptoms that do not go away after 3 days.  You have symptoms that suddenly get worse.  You have severe pain.  The amount of urine that drains from your bladder decreases. This information is not intended to replace advice given to you by your health care provider. Make sure you discuss any questions you have with your health care provider. Document Released: 06/01/2010 Document Revised: 04/11/2017 Document Reviewed: 11/11/2014 Elsevier Interactive Patient Education  2019 [employer].

## 2018-05-21 NOTE — Progress Notes (Signed)
Rescheduled chemo education class to 05/27/18 due to patient not feeling well.  She also requested to have the port insertion rescheduled to next week.  Apt rescheduled to 05/29/18.

## 2018-05-21 NOTE — Progress Notes (Unsigned)
Patient in clinic today for Chemo Ed and experiencing new mid back pain today while in clinic.  Spoke with Joylene John and she is examining patient.  Spoke with Elmo Putt, navigator and will reschedule chemo ed prior to beginning chemo/radiation.  Patient verbalized understanding

## 2018-05-21 NOTE — Progress Notes (Signed)
Met with patient and spouse along with interpreter to introduce myself as Arboriculturist and to offer available resources.  Discussed one-time $1000 [employer] grant to assist with personal expenses while going through treatment. Patient's spouse was able to confirm to letter of support and sign due to patient have no income. Approved patient for grant. Gave a copy of the expense sheet as well as the approval letter along with the Outpatient pharmacy information. Discussed in detail how expenses are paid. Thety verbalized understanding.  Gave my card for any additional financial questions or concerns.

## 2018-05-22 ENCOUNTER — Encounter: Payer: Self-pay | Admitting: Gynecologic Oncology

## 2018-05-22 ENCOUNTER — Other Ambulatory Visit (HOSPITAL_COMMUNITY): Payer: BLUE CROSS/BLUE SHIELD

## 2018-05-22 ENCOUNTER — Telehealth: Payer: Self-pay

## 2018-05-22 ENCOUNTER — Ambulatory Visit (HOSPITAL_COMMUNITY): Payer: BLUE CROSS/BLUE SHIELD

## 2018-05-22 LAB — URINE CULTURE: CULTURE: NO GROWTH

## 2018-05-22 NOTE — Telephone Encounter (Signed)
Holly Pena states that she is not having any difficulty self catheterizing. Urine is clear yellow in color.               Urine out put                     Catheter out put    05-21-18 1940            200 cc                                 571 cc  1-10 0300             400 cc       No Cath  0850              200 cc                  No Cath  0920              200 cc       No   Cath  0930                0                                        460 cc

## 2018-05-22 NOTE — Progress Notes (Signed)
S: Patient had just arrived to chemo education class when she began experiencing severe back pain. She has been urinating frequently since the catheter removal yesterday with amounts around 100-200 cc.  Mild burning reported this am. No fever or chills.  Chemo education RN came to the office to have the patient evaluated in the clinic. States she has difficulty feeling if her bladder is full.  O: Alert, oriented x 3.  Prior to catheterization, she urinated 100 cc of clear, yellow urine but did not feel completely emptied.  Patient in and out cathed without difficulty. Process discussed and demonstrated in case she would need to cath at home.  700 cc of clear, yellow urine out with in and out cath.  Urine sent for analysis and culture.  Pt tolerated well.  A:  Acute urinary retention s/p foley removal and successful voiding trial on 05/20/2018. Stage IIIC cervical cancer s/p radical hysterectomy on 04/28/18.   P: Patient instructed on self catheterization at home at least four times daily.  Supplies given along with a recording sheet and measuring device to record output. She will be contacted with the results of her urinalysis and culture from today. Interpreter and husband present for discussion.  Reportable signs and symptoms reviewed.  Chemo education class has been rescheduled.  She is advised to call for any needs and to follow up as planned.  Advised that our office will be reaching out to her to discuss her urinary output totals from self catheterization. Patient and husband verbalizing understanding. Dr. Denman George and Dr. Alvy Bimler aware of the above.

## 2018-05-25 ENCOUNTER — Other Ambulatory Visit: Payer: Self-pay

## 2018-05-25 ENCOUNTER — Ambulatory Visit
Admission: RE | Admit: 2018-05-25 | Discharge: 2018-05-25 | Disposition: A | Discharge status: Home / Self Care | Payer: BLUE CROSS/BLUE SHIELD | Source: Ambulatory Visit | Attending: Radiation Oncology | Admitting: Radiation Oncology

## 2018-05-25 ENCOUNTER — Encounter: Payer: Self-pay | Admitting: Radiation Oncology

## 2018-05-25 VITALS — BP 117/79 | HR 66 | Temp 98.4°F | Resp 18 | Ht 63.0 in | Wt 142.8 lb

## 2018-05-25 DIAGNOSIS — C531 Malignant neoplasm of exocervix: Secondary | ICD-10-CM

## 2018-05-25 DIAGNOSIS — Z79899 Other long term (current) drug therapy: Secondary | ICD-10-CM | POA: Diagnosis not present

## 2018-05-25 NOTE — Progress Notes (Signed)
Radiation Oncology         ([phone]) [phone] ________________________________  Initial Outpatient Consultation  Name: Holly Pena MRN: [id number]  Date: 05/25/2018  DOB: Jan 31, 1966  CC:Kim, Nickola Major, DO  Everitt Amber, MD   REFERRING PHYSICIAN: Everitt Amber, MD  DIAGNOSIS: The encounter diagnosis was Malignant neoplasm of exocervix [facility]).   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 female who is presenting to the office today for evaluation of recently diagnosed cervical cancer. She speaks [place] and is accompanied by a Optometrist and her husband. She originally presented for postmenopausal beginning in mid-October. A diagnosis of cervical cancer has since been given.   She underwent a CT scan with contrast on November 11 which showed an isolated low-density structure lateral to the descending colon. At this time, considerations did not include a cancer diagnosis. A biopsy was performed the following day which included tissue from four different cervical locations. At the 12 o'clock and 9 o'clock locations, invasive moderately differentiated squamous cell carcinoma cells were present. At the 3 o'clock and 6 o'clock positions, a high grade squamous intraepithelial lesion (CIN-III, high grade dysplasia). Accordingly, a PET scan was performed on November 22 which did reveal prominent hypermetabolism within the cervix, measuring approx. 3.5 x 2.5 cm, consistent with known cervical cancer. No parametrial extension of the tumor or abdominopelvic nodal metastatic disease was noted at this time. There was focal hypermetabolic activity within a single right neck lymph node (level 1B). This was noted to be not likely related to the cancer. She underwent a Robotic-assisted type III radical laparoscopic hysterectomy with bilateral salpingectomy and bilateral pelvic lymphadenectomy   on December 17. Lymph nodes, regional  resection, right pelvic region had eight benign lymph nodes. In the left pelvis, seven benign lymph nodes were discovered. Of the uterus, cervix, bilateral fallopian tubes and upper vagina, cancer was noted in the cervix and was classified as invasive poorly differentiated squamous cell carcinoma measuring 4.5 x 2.6 x 1.0 cm without margin involvement. There was, however, lymphovascular involvement by tumor and metastatic carcinoma in two of six paracervical lymph nodes. In the vagina's anterior margin, squamous mucosa with slight inflammation was noted, but no evidence of invasive carcinoma.  she reports associated trouble emptying her bladder. She reports not feeling totally normal yet following her surgery, citing some lingering abdominal and bladder discomfort. She reports some pain in her thighs bilaterally, as well as lower right side back pain. she denies numbness, headaches and any other symptoms.    PREVIOUS RADIATION THERAPY: No  PAST MEDICAL HISTORY:  has a past medical history of Cervical cancer ([place]), Frequent headaches, Numbness, PMB (postmenopausal bleeding), Seasonal allergies, Squamous cell carcinoma in situ, and Tuberculosis.    PAST SURGICAL HISTORY: Past Surgical History:  Procedure Laterality Date  . CESAREAN SECTION     x 2  . ENDOMETRIAL BIOPSY  03/10/2018  . lymph node surgery     at 42 months old, mother told her but not sure what surgery exactly  . PELVIC LYMPH NODE DISSECTION N/A 04/28/2018   Procedure: PELVIC LYMPH NODE DISSECTION;  Surgeon: Everitt Amber, MD;  Location: [facility];  Service: Gynecology;  Laterality: N/A;  . ROBOTIC ASSISTED TOTAL HYSTERECTOMY N/A 04/28/2018   Procedure: XI ROBOTIC ASSISTED RADICAL  HYSTERECTOMY;  Surgeon: Everitt Amber, MD;  Location: [facility];  Service: Gynecology;  Laterality: N/A;  . SALPINGOOPHORECTOMY Bilateral 04/28/2018   Procedure: Marilynn Rail  SALPINGO OOPHORECTOMY;  Surgeon: Everitt Amber, MD;  Location: [facility];  Service: Gynecology;   Laterality: Bilateral;    FAMILY HISTORY: family history includes Healthy in her father and mother; Hypertension in her mother.  SOCIAL HISTORY:  reports that she has never smoked. She has never used smokeless tobacco. She reports current alcohol use. She reports that she does not use drugs.  ALLERGIES: Patient has no known allergies.  MEDICATIONS:  Current Outpatient Medications  Medication Sig Dispense Refill  . Cholecalciferol (VITAMIN D3) 125 MCG (5000 UT) TABS Take 5,000 Units by mouth daily.     . Glucosamine-Chondroitin (MOVE FREE PO) Take 1 tablet by mouth daily.     Marland Kitchen ibuprofen (ADVIL,MOTRIN) 600 MG tablet Take 1 tablet (600 mg total) by mouth every 6 (six) hours as needed for mild pain. 30 tablet 1  . lidocaine-prilocaine (EMLA) cream Apply to affected area once 30 g 3  . ondansetron (ZOFRAN) 8 MG tablet Take 1 tablet (8 mg total) by mouth every 8 (eight) hours as needed. Start on the third day after chemotherapy. 30 tablet 1  . prochlorperazine (COMPAZINE) 10 MG tablet Take 1 tablet (10 mg total) by mouth every 6 (six) hours as needed (Nausea or vomiting). 30 tablet 1  . senna-docusate (SENOKOT-S) 8.6-50 MG tablet Take 2 tablets by mouth at bedtime. Do not take if having diarrhea/loose stools 30 tablet 1   No current facility-administered medications for this encounter.     REVIEW OF SYSTEMS:  A 10+ POINT REVIEW OF SYSTEMS WAS OBTAINED including neurology, dermatology, psychiatry, cardiac, respiratory, lymph, extremities, GI, GU, musculoskeletal, constitutional, reproductive, HEENT. All pertinent positives are noted in the HPI. All others are negative.    PHYSICAL EXAM:  height is 5\' 3"  (1.6 m) and weight is 142 lb 12.8 oz (64.8 kg). Her oral temperature is 98.4 F (36.9 C). Her blood pressure is 117/79 and her pulse is 66. Her respiration is 18 and oxygen saturation is 100%.   General: Alert and oriented, in no acute distress HEENT: Head is normocephalic. Extraocular  movements are intact. Oropharynx is clear. Neck: Neck is supple, no palpable cervical or supraclavicular lymphadenopathy. Heart: Regular in rate and rhythm with no murmurs, rubs, or gallops. Chest: Clear to auscultation bilaterally, with no rhonchi, wheezes, or rales. Abdomen: Soft, nontender, nondistended, with no rigidity or guarding. Extremities: No cyanosis or edema. Lymphatics: see Neck Exam Skin: No concerning lesions. Musculoskeletal: symmetric strength and muscle tone throughout. Neurologic: Cranial nerves II through XII are grossly intact. No obvious focalities. Speech is fluent. Coordination is intact. Psychiatric: Judgment and insight are intact. Affect is appropriate. Pelvic exam deferred today in light of recent surgery.    ECOG = 1  0 - Asymptomatic (Fully active, able to carry on all predisease activities without restriction)  1 - Symptomatic but completely ambulatory (Restricted in physically strenuous activity but ambulatory and able to carry out work of a light or sedentary nature. For example, light housework, office work)  2 - Symptomatic, <50% in bed during the day (Ambulatory and capable of all self care but unable to carry out any work activities. Up and about more than 50% of waking hours)  3 - Symptomatic, >50% in bed, but not bedbound (Capable of only limited self-care, confined to bed or chair 50% or more of waking hours)  4 - Bedbound (Completely disabled. Cannot carry on any self-care. Totally confined to bed or chair)  5 - Death   Eustace Pen MM, Creech RH, [place],  et al. [phone]). "Toxicity and response criteria of the [facility]". [place] Oncol. 5 (6): 649-55  LABORATORY DATA:  Lab Results  Component Value Date   WBC 5.1 05/12/2018   HGB 12.1 05/12/2018   HCT 36.9 05/12/2018   MCV 91.6 05/12/2018   PLT 297 05/12/2018   NEUTROABS 3.4 05/12/2018   Lab Results  Component Value Date   NA 140 05/12/2018   K 3.5 05/12/2018    CL 106 05/12/2018   CO2 25 05/12/2018   GLUCOSE 97 05/12/2018   CREATININE 0.68 05/12/2018   CALCIUM 9.3 05/12/2018      RADIOGRAPHY: Ct Abdomen Pelvis W Contrast  Result Date: 05/12/2018 CLINICAL DATA:  Status post surgery for cervical cancer 2 days ago. Left lower quadrant abdominal pain and hematuria, acute onset. EXAM: CT ABDOMEN AND PELVIS WITH CONTRAST TECHNIQUE: Multidetector CT imaging of the abdomen and pelvis was performed using the standard protocol following bolus administration of intravenous contrast. CONTRAST:  143mL ISOVUE-300 IOPAMIDOL (ISOVUE-300) INJECTION 61% COMPARISON:  PET/CT performed 04/03/2018 FINDINGS: Lower chest: The visualized lung bases are grossly clear. The visualized portions of the mediastinum are unremarkable. Hepatobiliary: The liver is unremarkable in appearance. The gallbladder is unremarkable in appearance. The common bile duct remains normal in caliber. Pancreas: The pancreas is within normal limits. Spleen: The spleen is unremarkable in appearance. Adrenals/Urinary Tract: The adrenal glands are unremarkable in appearance. The kidneys are within normal limits. There is no evidence of hydronephrosis. No renal or ureteral stones are identified. No perinephric stranding is seen. Stomach/Bowel: The stomach is unremarkable in appearance. The small bowel is within normal limits. The appendix is normal in caliber, without evidence of appendicitis. The colon is unremarkable in appearance. Vascular/Lymphatic: The abdominal aorta is unremarkable in appearance. The inferior vena cava is grossly unremarkable. No retroperitoneal lymphadenopathy is seen. No pelvic sidewall lymphadenopathy is identified. Reproductive: The bladder is decompressed, with a Foley catheter in place. Soft tissue inflammation about the bladder could reflect cystitis. Would correlate with the patient's symptoms. The patient is status post hysterectomy. No suspicious adnexal masses are seen. A small to  moderate amount of free fluid is noted within the pelvis, of uncertain significance. Other: No additional soft tissue abnormalities are seen. Musculoskeletal: No acute osseous abnormalities are identified. The visualized musculature is unremarkable in appearance. IMPRESSION: 1. Soft tissue inflammation about the bladder could reflect cystitis. Would correlate with the patient's symptoms. 2. Small to moderate amount of free fluid within the pelvis, of uncertain significance. This may simply be postoperative in nature, though if urine output from the Foley catheter decreases, further evaluation could be considered to exclude urine leak. Electronically Signed   By: Garald Balding M.D.   On: 05/12/2018 22:29      IMPRESSION: Stage III-C (pT1b2, pN1) poorly differentiated squamous cell carcinoma of the cervix. Given the pathologic findings with the lymphovascular space invasion, deep stromal invasion and paracervical lymph node metastasis, the patient would be at risk for pelvic recurrence and would recommend post-operative radiation therapy to optimize chances for long term control. The pt would likely benefit from radiosensitizing chemo during her pelvic treatments.   Today, I talked to the patient and her husband about the findings and work-up thus far. Discussion occurred with interpretive assistance.  We discussed the natural history of cervical cancer and general treatment, highlighting the role of radiotherapy in the management.  We discussed the available radiation techniques, and focused on the details of logistics and delivery.  We  reviewed the anticipated acute and late sequelae associated with radiation in this setting.  The patient was encouraged to ask questions that I answered to the best of my ability.  A patient consent form was discussed and signed.  We retained a copy for our records.  The patient would like to proceed with radiation and will be scheduled for CT simulation.   PLAN: Patient will  be scheduled for CT simulation once she has been cleared by surgery. Would recommend 5 weeks of external beam radiotherapy along with radiosensitizing chemotherapy. This will then be followed by central pelvic boost of 3 external beam radiation treatments or 3 intracavitary brachytherapy treatments. Brachytherapy or external beam boost will be determined after discussion with Dr. Denman George.    ------------------------------------------------  Blair Promise, PhD, MD      This document serves as a record of services personally performed by Gery Pray, MD. It was created on his behalf by Mary-Margaret Loma Messing, a trained medical scribe. The creation of this record is based on the scribe's personal observations and the provider's statements to them. This document has been checked and approved by the attending provider.

## 2018-05-27 ENCOUNTER — Encounter: Payer: Self-pay | Admitting: Gynecologic Oncology

## 2018-05-27 ENCOUNTER — Inpatient Hospital Stay: Payer: BLUE CROSS/BLUE SHIELD

## 2018-05-27 ENCOUNTER — Inpatient Hospital Stay (HOSPITAL_BASED_OUTPATIENT_CLINIC_OR_DEPARTMENT_OTHER): Payer: BLUE CROSS/BLUE SHIELD | Admitting: Gynecologic Oncology

## 2018-05-27 ENCOUNTER — Other Ambulatory Visit: Payer: BLUE CROSS/BLUE SHIELD

## 2018-05-27 VITALS — BP 125/82 | HR 60 | Temp 98.5°F | Resp 20 | Ht 63.0 in | Wt 142.0 lb

## 2018-05-27 DIAGNOSIS — C539 Malignant neoplasm of cervix uteri, unspecified: Secondary | ICD-10-CM | POA: Diagnosis not present

## 2018-05-27 DIAGNOSIS — R600 Localized edema: Secondary | ICD-10-CM

## 2018-05-27 DIAGNOSIS — Z90710 Acquired absence of both cervix and uterus: Secondary | ICD-10-CM

## 2018-05-27 DIAGNOSIS — Z7189 Other specified counseling: Secondary | ICD-10-CM | POA: Insufficient documentation

## 2018-05-27 DIAGNOSIS — Z90722 Acquired absence of ovaries, bilateral: Secondary | ICD-10-CM

## 2018-05-27 NOTE — Progress Notes (Signed)
[place] at [facility]   Progress Note: Established Patient Follow-Up Visit   Consult was originally requested by Dr. Mora Bellman for cervical cancer  Chief Complaint  Patient presents with  . Malignant neoplasm of cervix, unspecified site [facility])    GYN Oncologic Summary 1. Stage IIIC SCCa Cervix S/p radical hysterectomy, BSO, lymphadenectomy on 04/28/18  HPI: [name]  is a very nice 53 y.o.  P2  Interval History  On April 28, 2018 she underwent robotic assisted type III radical laparoscopic hysterectomy with bilateral salpingectomy and bilateral pelvic lymphadenectomy.  Surgery was uncomplicated.  Operative findings is significant for 3.5 cm tumor placed in the anterior and right cervix with no gross parametrial involvement.  Pathology revealed a 4.5 x 2.6 x 1 cm invasive, poorly differentiated squamous cell carcinoma.  This resided in the cervix.  Margins were not involved.  There was lymphovascular involvement by the tumor.  2 of 6 paracervical lymph nodes were positive for metastatic carcinoma.  The right and left pelvic lymphadenectomy specimens were negative for metastatic disease.  She was determined to have high risk features for recurrence with deep cervical stromal involvement, large sized tumor greater than 4 cm, lymphovascular space invasion, and metastatic involvement of lymph nodes.  In accordance with NCCN guidelines she was recommended adjuvant radiation with chemosensitization with cisplatin.  Postoperatively she was seen in the office for evaluation for a voiding trial.  She was initially unable to void urine spontaneously after Foley removal and required replacement.  Following second attempt at removal of the Foley she continues to have voiding issues with lack of urge and inability to void.  She was instructed on how to intermittently self catheterize and instructed to attempt to void followed by self catheterization every  2 hours.  Today she informs me that she still no longer feels the urge to void however when she does attempt to void every 2 hours she is able to void urine and the volume that she manages after self-catheterization is less than 50 cc which indicates adequate emptying and detrusor strength.  She has met with Drs. Kinard and Alvy Bimler in preparation for treatment.  She reports some mild bilateral lower extremity edema consistent with early stages of lymphedema.  She reports bilateral genitofemoral nerve dysfunction. She presents with her husband and a Pharmacist, hospital.   Oncologic Course She went through menopause ~2017. On February 20, 2018 she noted post-menopausal bleeding that lasted ~ 2 weeks (intermittent). She presented to her PCP who referred her on to Dr. Elly Modena. On exam she was noted to have a bulky and friable cervix. A Pap and EMB were performed.   A TVUS was ordered and done 03/17/18 revealing a normal uterus with 28m EM lining. No adnexal masses.  Pap and EMB revealed squamous cell CA.  She is thus referred for management and recommendations.  Her last pelvic/Pap was at the time of her last pregnancy ~16 years ago  Dr PGerarda Fractiontook her to the operating room for an EUA and cervical biopsies due to intolerance of examination in the office. Findings as noted:  Anterior cervical lip with gross lesion from ~10-12:00. Remainder of cervix grossly normal. Estimate lesion to be <2cm. No vaginal or parametrial involvement.   Grossly the remainder of the cervix appeared normal. It was firm on palpation but no fungating lesions. Pathology confirmed what the Pap/EMB showed 1. Cervix, biopsy, 12 o'clock - INVASIVE MODERATELY DIFFERENTIATED SQUAMOUS CELL CARCINOMA. SEE NOTE. 2.  Cervix, biopsy, 3 o'clock - HIGH GRADE SQUAMOUS INTRAEPITHELIAL LESION (CIN-III, HIGH GRADE DYSPLASIA). SEE NOTE. 3. Cervix, biopsy, 9 o'clock - INVASIVE MODERATELY DIFFERENTIATED SQUAMOUS CELL CARCINOMA. SEE  NOTE. 4. Cervix, biopsy, 6 o'clock - HIGH GRADE SQUAMOUS INTRAEPITHELIAL LESION (CIN-III, HIGH GRADE DYSPLASIA). SEE NOTE. Diagnosis Note 1. 2, 3 and 4. Dr. Melina Copa has reviewed this case and concurs with the above interpretation. (NDK:gt, 03/25/18) Holly Folds MD   Dr Gerarda Fraction staged her clinically as a Stage IB1 (FIGO 2018) SCCa Cervix and ordered a PET/CT. Because Dr Gerarda Fraction stated that the tumor was <2cm, she counseled the patient that she was a candidate for either MIS or open approach. The patient elected for MIS approach and therefore elected to have surgery with Dr Denman George as Dr Gerarda Fraction does not perform radical hysterectomy via a MIS route.   PET/CT resulted with no apparent metastatic disease (though there was a mildly PET avid node in the neck felt to be unrelated). She had a 3.5cm area of increased avidity in the cervix.   The patient elected for robotic assisted hysterectomy, BSO, lymphadenectomy.   Imported EPIC Oncologic History:    Cervical cancer ([place])   02/20/2018 Initial Diagnosis    She presented with postmenopausal bleeding    03/10/2018 Pathology Results    Endometrium, biopsy - SQUAMOUS CELL CARCINOMA. - SEE COMMENT.    03/17/2018 Imaging    US pelvis: Endometrium measures 7 mm. In the setting of post-menopausal bleeding, endometrial sampling is indicated to exclude carcinoma    03/24/2018 Procedure    Pre-operative Diagnosis:  Suspect SCCa Cervix based on endometrial biopsy  Post-operative Diagnosis:  Stage IB1 (FIGO 2018) SCCa Cervix  Operation:  Exam under anesthesia Cervical biopsies  Operative Findings:  Anterior cervical lip with gross lesion from ~10-12:00. Remainder of cervix grossly normal. Estimate lesion to be <2cm. No vaginal or parametrial involvement.    03/30/2018 Imaging    CT scan of chest, abdomen and pelvis: 1. Isolated low-density structure lateral to the descending colon. Differential considerations include isolated peritoneal  implant/metastasis, GI duplication/mesenteric cyst, or postoperative collection such as seroma (clinical history only describes prior Caesarean section). Consider further evaluation with PET. 2. Otherwise, no evidence of metastatic disease in the chest, abdomen, or pelvis.    04/04/2018 PET scan    1. Prominent hypermetabolism within the cervix, consistent with known cervical cancer. No parametrial extension of tumor or abdominopelvic nodal metastatic disease. 2. The previously demonstrated low-density structure posterior to the descending colon demonstrates no hypermetabolic activity and is likely an incidental duplication/mesenteric cyst. 3. Focal hypermetabolic activity within a single right neck lymph node (level 1B). This is unlikely to be related to the patient's cervical cancer. No hypermetabolism is seen within the pharyngeal mucosal space. ENT evaluation should be considered, especially if the patient has risk factors for head and neck malignancy.    04/28/2018 Surgery    Surgeon: Holly Pena  Pre-operative Diagnosis: stage IB2 cervical cancer  Post-operative Diagnosis: same  Operation: Robotic-assisted type III radical laparoscopic hysterectomy with bilateral salpingectomy and bilateral pelvic lymphadenectomy  Surgeon: Holly Pena  Operative Findings:  : 3.5cm tumor replacing the anterior and right cervix. No gross parametrial involvement.         Specimens: left and right pelvic nodes, uterus with cervix, bilateral tubes and upper vagina. Anterior vaginal margin with marking stitch at 12 o'clock of new true distal anterior vaginal margin         Complications:  None; patient tolerated the  procedure well.             04/28/2018 Pathology Results    1. Lymph nodes, regional resection, right pelvic - EIGHT BENIGN LYMPH NODES (0/8). 2. Lymph nodes, regional resection, left pelvic - SEVEN BENIGN LYMPH NODES (0/7). 3. Uterus, cervix and bilateral fallopian  tubes, upper vagina CERVIX: - INVASIVE POORLY DIFFERENTIATED SQUAMOUS CELL CARCINOMA, 4.5 X 2.6 X 1.0 CM. - MARGINS NOT INVOLVED. - LYMPHOVASCULAR INVOLVEMENT BY TUMOR. - METASTATIC CARCINOMA IN TWO OF SIX PARACERVICAL LYMPH NODES (2/6). BENIGN ENDOMETRIUM AND MYOMETRIUM SEE ONCOLOGY TABLE. 4. Vagina, biopsy, anterior vaginal margin - SQUAMOUS MUCOSA WITH SLIGHT INFLAMMATION. - NO EVIDENCE OF INVASIVE CARCINOMA. Microscopic Comment 3. UTERINE CERVIX: Resection  Procedure: Hysterectomy with right and left pelvic regional lymph nodes and anterior vaginal margin. Tumor Size: 4.5 x 2.6 x 1.0 cm. Histologic Type: Squamous cell carcinoma Histologic Grade: Poorly differentiated Stromal Invasion: Yes Depth of stromal invasion (millimeters): 10 mm Horizontal extent longitudinal/length (if applicable#) (millimeters): 25 mm Horizontal extent circumferential/width (if applicable#) (millimeters): 45 mm Other Tissue/ Organ: No Margins: Free of tumor Lymphovascular: Present Regional Lymph Nodes: Two positive for tumor cells (select all that apply) Total Number of Lymph Nodes Examined: Twenty-one Number of Sentinel Nodes Examined (if applicable): 0 Pathologic Stage Classification (pTNM, AJCC 8th Edition): pT1b2, pN1 Ancillary Studies: N/A Representative Tumor Block: 3C, 3D, 3E, 72F, 3J and 3K Comment(s): The tumor is a poorly differentiated invasive squamous cell carcinoma which is up to 1.0 cm (10 mm) in thickness. There is lymphovascular space involvement within the wall of the cervix and in the paracervical connective tissue. The margins of the specimen are not involved by carcinoma (JDP:kh 04/29/18)    05/11/2018 Cancer Staging    Staging form: Cervix Uteri, AJCC 8th Edition - Pathologic stage from 05/11/2018: Stage III (pT3, pN1, cM0) - Signed by Heath Lark, MD on 05/11/2018    05/12/2018 Imaging    1. Soft tissue inflammation about the bladder could reflect cystitis. Would correlate with  the patient's symptoms. 2. Small to moderate amount of free fluid within the pelvis, of uncertain significance. This may simply be postoperative in nature, though if urine output from the Foley catheter decreases, further evaluation could be considered to exclude urine leak.     Measurement of disease: TBD . Marland Kitchen  Radiology: Ct Chest W Contrast  Result Date: 03/30/2018 CLINICAL DATA:  New diagnosis of cervical cancer. Staging. Postmenopausal bleeding starting 1 month ago. EXAM: CT CHEST, ABDOMEN, AND PELVIS WITH CONTRAST TECHNIQUE: Multidetector CT imaging of the chest, abdomen and pelvis was performed following the standard protocol during bolus administration of intravenous contrast. CONTRAST:  127m OMNIPAQUE IOHEXOL 300 MG/ML  SOLN COMPARISON:  Pelvic ultrasound 03/17/2018. FINDINGS: CT CHEST FINDINGS Cardiovascular: Normal aortic caliber. Normal heart size, without pericardial effusion. No central pulmonary embolism, on this non-dedicated study. Mediastinum/Nodes: No supraclavicular adenopathy. No mediastinal or hilar adenopathy. Lungs/Pleura: No pleural fluid.  Clear lungs. Musculoskeletal: No acute osseous abnormality. CT ABDOMEN PELVIS FINDINGS Hepatobiliary: 9 mm hypoattenuating right hepatic lobe lesion on image 58/2 is favored to represent a cyst. A central right hepatic lobe 3 mm lesion is too small to characterize but also most likely a cyst. Normal gallbladder, without biliary ductal dilatation. Pancreas: Normal, without mass or ductal dilatation. Spleen: Normal in size, without focal abnormality. Adrenals/Urinary Tract: Normal adrenal glands. Normal kidneys, without hydronephrosis. Normal urinary bladder. Stomach/Bowel: Normal stomach, without wall thickening. Normal colon, appendix, and terminal ileum. Normal small bowel. Vascular/Lymphatic: Normal caliber of the  aorta and branch vessels. No abdominopelvic adenopathy. Reproductive: Suspect mild soft tissue fullness within the cervix. No  gross parametrial extension. No adnexal mass. Other: No significant free fluid. An isolated low-density lesion lateral to the descending colon measures 2.2 x 2.2 cm on image 85/2. This has calcification in its posterior portion. Musculoskeletal: Sclerotic lesion in the right sacrum is likely a bone island. IMPRESSION: 1. Isolated low-density structure lateral to the descending colon. Differential considerations include isolated peritoneal implant/metastasis, GI duplication/mesenteric cyst, or postoperative collection such as seroma (clinical history only describes prior Caesarean section). Consider further evaluation with PET. 2. Otherwise, no evidence of metastatic disease in the chest, abdomen, or pelvis. Electronically Signed   By: Abigail Miyamoto M.D.   On: 03/30/2018 16:17   Ct Abdomen Pelvis W Contrast  Result Date: 05/12/2018 CLINICAL DATA:  Status post surgery for cervical cancer 2 days ago. Left lower quadrant abdominal pain and hematuria, acute onset. EXAM: CT ABDOMEN AND PELVIS WITH CONTRAST TECHNIQUE: Multidetector CT imaging of the abdomen and pelvis was performed using the standard protocol following bolus administration of intravenous contrast. CONTRAST:  116m ISOVUE-300 IOPAMIDOL (ISOVUE-300) INJECTION 61% COMPARISON:  PET/CT performed 04/03/2018 FINDINGS: Lower chest: The visualized lung bases are grossly clear. The visualized portions of the mediastinum are unremarkable. Hepatobiliary: The liver is unremarkable in appearance. The gallbladder is unremarkable in appearance. The common bile duct remains normal in caliber. Pancreas: The pancreas is within normal limits. Spleen: The spleen is unremarkable in appearance. Adrenals/Urinary Tract: The adrenal glands are unremarkable in appearance. The kidneys are within normal limits. There is no evidence of hydronephrosis. No renal or ureteral stones are identified. No perinephric stranding is seen. Stomach/Bowel: The stomach is unremarkable in  appearance. The small bowel is within normal limits. The appendix is normal in caliber, without evidence of appendicitis. The colon is unremarkable in appearance. Vascular/Lymphatic: The abdominal aorta is unremarkable in appearance. The inferior vena cava is grossly unremarkable. No retroperitoneal lymphadenopathy is seen. No pelvic sidewall lymphadenopathy is identified. Reproductive: The bladder is decompressed, with a Foley catheter in place. Soft tissue inflammation about the bladder could reflect cystitis. Would correlate with the patient's symptoms. The patient is status post hysterectomy. No suspicious adnexal masses are seen. A small to moderate amount of free fluid is noted within the pelvis, of uncertain significance. Other: No additional soft tissue abnormalities are seen. Musculoskeletal: No acute osseous abnormalities are identified. The visualized musculature is unremarkable in appearance. IMPRESSION: 1. Soft tissue inflammation about the bladder could reflect cystitis. Would correlate with the patient's symptoms. 2. Small to moderate amount of free fluid within the pelvis, of uncertain significance. This may simply be postoperative in nature, though if urine output from the Foley catheter decreases, further evaluation could be considered to exclude urine leak. Electronically Signed   By: JGarald BaldingM.D.   On: 05/12/2018 22:29   Ct Abdomen Pelvis W Contrast  Result Date: 03/30/2018 CLINICAL DATA:  New diagnosis of cervical cancer. Staging. Postmenopausal bleeding starting 1 month ago. EXAM: CT CHEST, ABDOMEN, AND PELVIS WITH CONTRAST TECHNIQUE: Multidetector CT imaging of the chest, abdomen and pelvis was performed following the standard protocol during bolus administration of intravenous contrast. CONTRAST:  1057mOMNIPAQUE IOHEXOL 300 MG/ML  SOLN COMPARISON:  Pelvic ultrasound 03/17/2018. FINDINGS: CT CHEST FINDINGS Cardiovascular: Normal aortic caliber. Normal heart size, without  pericardial effusion. No central pulmonary embolism, on this non-dedicated study. Mediastinum/Nodes: No supraclavicular adenopathy. No mediastinal or hilar adenopathy. Lungs/Pleura: No pleural fluid.  Clear lungs.  Musculoskeletal: No acute osseous abnormality. CT ABDOMEN PELVIS FINDINGS Hepatobiliary: 9 mm hypoattenuating right hepatic lobe lesion on image 58/2 is favored to represent a cyst. A central right hepatic lobe 3 mm lesion is too small to characterize but also most likely a cyst. Normal gallbladder, without biliary ductal dilatation. Pancreas: Normal, without mass or ductal dilatation. Spleen: Normal in size, without focal abnormality. Adrenals/Urinary Tract: Normal adrenal glands. Normal kidneys, without hydronephrosis. Normal urinary bladder. Stomach/Bowel: Normal stomach, without wall thickening. Normal colon, appendix, and terminal ileum. Normal small bowel. Vascular/Lymphatic: Normal caliber of the aorta and branch vessels. No abdominopelvic adenopathy. Reproductive: Suspect mild soft tissue fullness within the cervix. No gross parametrial extension. No adnexal mass. Other: No significant free fluid. An isolated low-density lesion lateral to the descending colon measures 2.2 x 2.2 cm on image 85/2. This has calcification in its posterior portion. Musculoskeletal: Sclerotic lesion in the right sacrum is likely a bone island. IMPRESSION: 1. Isolated low-density structure lateral to the descending colon. Differential considerations include isolated peritoneal implant/metastasis, GI duplication/mesenteric cyst, or postoperative collection such as seroma (clinical history only describes prior Caesarean section). Consider further evaluation with PET. 2. Otherwise, no evidence of metastatic disease in the chest, abdomen, or pelvis. Electronically Signed   By: Abigail Miyamoto M.D.   On: 03/30/2018 16:17   Nm Pet Image Initial (pi) Skull Base To Thigh  Addendum Date: 04/17/2018   ADDENDUM REPORT: 04/17/2018  11:01 ADDENDUM: The hypermetabolic cervical lesion measures approximately 3.5 x 2.5 cm. Electronically Signed   By: Marijo Sanes M.D.   On: 04/17/2018 11:01   Result Date: 04/17/2018 CLINICAL DATA:  Initial treatment strategy for cervical cancer. EXAM: NUCLEAR MEDICINE PET SKULL BASE TO THIGH TECHNIQUE: 6.6 mCi F-18 FDG was injected intravenously. Full-ring PET imaging was performed from the skull base to thigh after the radiotracer. CT data was obtained and used for attenuation correction and anatomic localization. Fasting blood glucose: 85 mg/dl COMPARISON:  CTs of the chest, abdomen and pelvis 03/30/2018 FINDINGS: Mediastinal blood pool activity: SUV max 2.7 NECK: There is a single hypermetabolic level 1B lymph node on the right, measuring 8 mm short axis on image 28/4 (SUV max 5.2). There are no other hypermetabolic cervical lymph nodes.There are no lesions of the pharyngeal mucosal space. Incidental CT findings: none CHEST: There are no hypermetabolic mediastinal, hilar or axillary lymph nodes. No suspicious pulmonary nodules. Incidental CT findings: none ABDOMEN/PELVIS: There is no hypermetabolic activity within the liver, adrenal glands, spleen or pancreas. There is no hypermetabolic nodal activity. There is focal hypermetabolic activity in the cervix (SUV max 11.2). There is no parametrial hypermetabolic activity. The fluid attenuation 2.4 cm collection posterior to the descending colon demonstrates no hypermetabolic activity. Incidental CT findings: No hydronephrosis. SKELETON: There is no hypermetabolic activity to suggest osseous metastatic disease. Incidental CT findings: Stable sclerotic lesion in the right sacrum, likely a bone island. IMPRESSION: 1. Prominent hypermetabolism within the cervix, consistent with known cervical cancer. No parametrial extension of tumor or abdominopelvic nodal metastatic disease. 2. The previously demonstrated low-density structure posterior to the descending colon  demonstrates no hypermetabolic activity and is likely an incidental duplication/mesenteric cyst. 3. Focal hypermetabolic activity within a single right neck lymph node (level 1B). This is unlikely to be related to the patient's cervical cancer. No hypermetabolism is seen within the pharyngeal mucosal space. ENT evaluation should be considered, especially if the patient has risk factors for head and neck malignancy. Electronically Signed: By: Richardean Sale M.D. On: 04/04/2018  12:23   Mm Digital Screening Bilateral  Result Date: 04/24/2018 CLINICAL DATA:  Screening. EXAM: DIGITAL SCREENING BILATERAL MAMMOGRAM WITH CAD COMPARISON:  None. ACR Breast Density Category b: There are scattered areas of fibroglandular density. FINDINGS: There are no findings suspicious for malignancy. Images were processed with CAD. IMPRESSION: No mammographic evidence of malignancy. A result letter of this screening mammogram will be mailed directly to the patient. RECOMMENDATION: Screening mammogram in one year. (Code:SM-B-01Y) BI-RADS CATEGORY  1: Negative. Electronically Signed   By: Lovey Newcomer M.D.   On: 04/24/2018 15:39   US Pelvic Complete With Transvaginal  Result Date: 03/17/2018 CLINICAL DATA:  Patient with postmenopausal bleeding. EXAM: TRANSABDOMINAL AND TRANSVAGINAL ULTRASOUND OF PELVIS TECHNIQUE: Both transabdominal and transvaginal ultrasound examinations of the pelvis were performed. Transabdominal technique was performed for global imaging of the pelvis including uterus, ovaries, adnexal regions, and pelvic cul-de-sac. It was necessary to proceed with endovaginal exam following the transabdominal exam to visualize the endometrium. COMPARISON:  None FINDINGS: Uterus Measurements: 7.2 x 2.3 x 3.7 cm = volume: 32.4 mL. No fibroids or other mass visualized. Endometrium Thickness: 7 mm.  No focal abnormality visualized. Right ovary Not visualized. Left ovary Not visualized. Other findings No abnormal free fluid.  IMPRESSION: Endometrium measures 7 mm. In the setting of post-menopausal bleeding, endometrial sampling is indicated to exclude carcinoma. If results are benign, sonohysterogram should be considered for focal lesion work-up. (Ref: Radiological Reasoning: Algorithmic Workup of Abnormal Vaginal Bleeding with Endovaginal Sonography and Sonohysterography. AJR 2008; 270:W23-76) Electronically Signed   By: Lovey Newcomer M.D.   On: 03/17/2018 15:24   .   Outpatient Encounter Medications as of 05/27/2018  Medication Sig  . Glucosamine-Chondroitin (MOVE FREE PO) Take 1 tablet by mouth daily.   Marland Kitchen lidocaine-prilocaine (EMLA) cream Apply to affected area once  . ondansetron (ZOFRAN) 8 MG tablet Take 1 tablet (8 mg total) by mouth every 8 (eight) hours as needed. Start on the third day after chemotherapy.  . prochlorperazine (COMPAZINE) 10 MG tablet Take 1 tablet (10 mg total) by mouth every 6 (six) hours as needed (Nausea or vomiting).  Marland Kitchen senna-docusate (SENOKOT-S) 8.6-50 MG tablet Take 2 tablets by mouth at bedtime. Do not take if having diarrhea/loose stools  . ibuprofen (ADVIL,MOTRIN) 600 MG tablet Take 1 tablet (600 mg total) by mouth every 6 (six) hours as needed for mild pain. (Patient not taking: Reported on 05/27/2018)  . [DISCONTINUED] Cholecalciferol (VITAMIN D3) 125 MCG (5000 UT) TABS Take 5,000 Units by mouth daily.    No facility-administered encounter medications on file as of 05/27/2018.    No Known Allergies  Past Medical History:  Diagnosis Date  . Cervical cancer ([place])   . Frequent headaches    due to lack of sleep  . Numbness    both hands and fingers  . PMB (postmenopausal bleeding)   . Seasonal allergies   . Squamous cell carcinoma in situ   . Tuberculosis    positive, CXR clear   Past Surgical History:  Procedure Laterality Date  . CESAREAN SECTION     x 2  . ENDOMETRIAL BIOPSY  03/10/2018  . lymph node surgery     at 43 months old, mother told her but not sure what surgery  exactly  . PELVIC LYMPH NODE DISSECTION N/A 04/28/2018   Procedure: PELVIC LYMPH NODE DISSECTION;  Surgeon: Everitt Amber, MD;  Location: [facility];  Service: Gynecology;  Laterality: N/A;  . ROBOTIC ASSISTED TOTAL HYSTERECTOMY N/A 04/28/2018  Procedure: XI ROBOTIC ASSISTED RADICAL  HYSTERECTOMY;  Surgeon: Everitt Amber, MD;  Location: [facility];  Service: Gynecology;  Laterality: N/A;  . SALPINGOOPHORECTOMY Bilateral 04/28/2018   Procedure: Marilynn Rail SALPINGO OOPHORECTOMY;  Surgeon: Everitt Amber, MD;  Location: [facility];  Service: Gynecology;  Laterality: Bilateral;        Past Gynecological History:   GYNECOLOGIC HISTORY:  . Patient's last menstrual period was 02/20/2018. age 14 . Menarche: 53 years old . P 2 . Contraceptive condoms . HRT None  . Last Pap  Referral pap SCCa Family Hx:  Family History  Problem Relation Age of Onset  . Healthy Mother   . Hypertension Mother   . Healthy Father   . Cancer Neg Hx   . Heart disease Neg Hx    Social Hx:  Marland Kitchen Tobacco use: none . Alcohol use: none . Illicit Drug use: none . Illicit IV Drug use: none    Review of Systems: Review of Systems  Constitutional: Negative.   HENT:  Negative.   Eyes: Negative.   Respiratory: Negative.   Cardiovascular: Negative.   Gastrointestinal: Negative.   Endocrine: Negative.   Genitourinary: Positive for difficulty urinating.   Musculoskeletal: Negative.   Skin: Negative.   Neurological: Negative.   Hematological: Negative.   Psychiatric/Behavioral: Negative.   All other systems reviewed and are negative.  Vitals:  Vitals:   05/27/18 1555  BP: 125/82  Pulse: 60  Resp: 20  Temp: 98.5 F (36.9 C)  SpO2: 100%   Vitals:   05/27/18 1555  Weight: 142 lb (64.4 kg)  Height: '5\' 3"'$  (1.6 m)   Body mass index is 25.15 kg/m.  Physical Exam: General :  Well developed, 53 y.o., female in no apparent distress HEENT:  Normocephalic/atraumatic, symmetric, EOMI, eyelids normal Neck:   No visible masses.   Respiratory:  Respirations unlabored, no use of accessory muscles CV:   Deferred Breast:   Deferred Musculoskeletal: Normal muscle strength. Abdomen:  No visible masses or protrusion. Well healed incisions Extremities:  No visible edema or deformities Skin:   Normal inspection Neuro/Psych:  No focal motor deficit, no abnormal mental status. Normal gait. Normal affect. Alert and oriented to person, place, and time  Genitourinary: Vagina significantly shortened (<10cm) with in tact cuff, suture material present, healing normally.    Assessment  Stage IIIC SCCa Cervix   Plan  I recommend chemoradiation as directed by NCCN guidelines for high risk, advanced stage cervical cancer. She will see me back for regular evaluations as part of surveillance after completing therapy. I feel that she has healed adequately to commence therapy.  I had a lengthy discussion with the assistance of the Mandarin translator regarding the prognosis of her cancer, the high risk for recurrence even after adjuvant therapy which is a result of advanced stage tumor being present in the lymph nodes in lymphovascular spaces deeply invasive into the cervical stroma.  I discussed that we will carefully monitor for recurrence in the post-treatment setting.  I recommend she continue to void regularly (3 hourly intervals) given that she has lack of urge to void/bladder sensation. Counseled regarding the risks of retention of urine (permanently flaccid bladder).    30 minutes of direct face to face counseling time was spent with the patient. This included discussion about prognosis, therapy recommendations and postoperative side effects and are beyond the scope of routine postoperative care.  Everitt Amber, MD Gynecologic Oncologist 05/27/2018, 5:19 PM

## 2018-05-27 NOTE — Patient Instructions (Signed)
Dr Denman George is recommending that you begin chemotherapy and radiation on 06/05/17 as planned as you are healing well.  Dr Denman George will see you back for regular follow-up checks after you have completed therapy.  Continue to attempt to empty your bladder every 3 hours (you may stretch this to 6 hours overnight).

## 2018-05-28 ENCOUNTER — Other Ambulatory Visit (HOSPITAL_COMMUNITY): Payer: BLUE CROSS/BLUE SHIELD

## 2018-05-28 ENCOUNTER — Ambulatory Visit (HOSPITAL_COMMUNITY): Payer: BLUE CROSS/BLUE SHIELD

## 2018-05-28 ENCOUNTER — Other Ambulatory Visit: Payer: Self-pay | Admitting: Radiology

## 2018-05-29 ENCOUNTER — Ambulatory Visit: Payer: BLUE CROSS/BLUE SHIELD

## 2018-05-29 ENCOUNTER — Ambulatory Visit (HOSPITAL_COMMUNITY)
Admission: RE | Admit: 2018-05-29 | Discharge: 2018-05-29 | Disposition: A | Discharge status: Home / Self Care | Payer: BLUE CROSS/BLUE SHIELD | Source: Ambulatory Visit | Attending: Hematology and Oncology | Admitting: Hematology and Oncology

## 2018-05-29 ENCOUNTER — Encounter (HOSPITAL_COMMUNITY): Payer: Self-pay

## 2018-05-29 DIAGNOSIS — C531 Malignant neoplasm of exocervix: Secondary | ICD-10-CM | POA: Insufficient documentation

## 2018-05-29 DIAGNOSIS — Z452 Encounter for adjustment and management of vascular access device: Secondary | ICD-10-CM | POA: Insufficient documentation

## 2018-05-29 HISTORY — PX: IR IMAGING GUIDED PORT INSERTION: IMG5740

## 2018-05-29 LAB — CBC WITH DIFFERENTIAL/PLATELET
Abs Immature Granulocytes: 0.01 10*3/uL (ref 0.00–0.07)
Basophils Absolute: 0 10*3/uL (ref 0.0–0.1)
Basophils Relative: 1 %
Eosinophils Absolute: 0.2 10*3/uL (ref 0.0–0.5)
Eosinophils Relative: 5 %
HEMATOCRIT: 38.7 % (ref 36.0–46.0)
HEMOGLOBIN: 12.5 g/dL (ref 12.0–15.0)
Immature Granulocytes: 0 %
Lymphocytes Relative: 30 %
Lymphs Abs: 0.9 10*3/uL (ref 0.7–4.0)
MCH: 29.6 pg (ref 26.0–34.0)
MCHC: 32.3 g/dL (ref 30.0–36.0)
MCV: 91.5 fL (ref 80.0–100.0)
Monocytes Absolute: 0.3 10*3/uL (ref 0.1–1.0)
Monocytes Relative: 8 %
NEUTROS ABS: 1.7 10*3/uL (ref 1.7–7.7)
Neutrophils Relative %: 56 %
Platelets: 217 10*3/uL (ref 150–400)
RBC: 4.23 MIL/uL (ref 3.87–5.11)
RDW: 12.8 % (ref 11.5–15.5)
WBC: 3.1 10*3/uL — ABNORMAL LOW (ref 4.0–10.5)
nRBC: 0 % (ref 0.0–0.2)

## 2018-05-29 LAB — PROTIME-INR
INR: 0.86
Prothrombin Time: 11.7 seconds (ref 11.4–15.2)

## 2018-05-29 MED ORDER — LIDOCAINE-EPINEPHRINE (PF) 2 %-1:200000 IJ SOLN
INTRAMUSCULAR | Status: AC
  Filled 2018-05-29: qty 20

## 2018-05-29 MED ORDER — LIDOCAINE-EPINEPHRINE (PF) 1 %-1:200000 IJ SOLN
INTRAMUSCULAR | Status: AC | PRN
  Administered 2018-05-29: 10 mL

## 2018-05-29 MED ORDER — HEPARIN SOD (PORK) LOCK FLUSH 100 UNIT/ML IV SOLN
INTRAVENOUS | Status: AC | PRN
  Administered 2018-05-29: 500 [IU] via INTRAVENOUS

## 2018-05-29 MED ORDER — MIDAZOLAM HCL 2 MG/2ML IJ SOLN
INTRAMUSCULAR | Status: AC
  Filled 2018-05-29: qty 4

## 2018-05-29 MED ORDER — HEPARIN SOD (PORK) LOCK FLUSH 100 UNIT/ML IV SOLN
INTRAVENOUS | Status: AC
  Filled 2018-05-29: qty 5

## 2018-05-29 MED ORDER — ACETAMINOPHEN 325 MG PO TABS
ORAL_TABLET | ORAL | Status: AC
  Administered 2018-05-29: 650 mg
  Filled 2018-05-29: qty 2

## 2018-05-29 MED ORDER — LIDOCAINE HCL 1 % IJ SOLN
INTRAMUSCULAR | Status: AC
  Filled 2018-05-29: qty 20

## 2018-05-29 MED ORDER — FENTANYL CITRATE (PF) 100 MCG/2ML IJ SOLN
INTRAMUSCULAR | Status: AC
  Filled 2018-05-29: qty 2

## 2018-05-29 MED ORDER — MIDAZOLAM HCL 2 MG/2ML IJ SOLN
INTRAMUSCULAR | Status: AC | PRN
  Administered 2018-05-29 (×2): 1 mg via INTRAVENOUS

## 2018-05-29 MED ORDER — CEFAZOLIN SODIUM-DEXTROSE 2-4 GM/100ML-% IV SOLN
INTRAVENOUS | Status: AC
  Filled 2018-05-29: qty 100

## 2018-05-29 MED ORDER — FENTANYL CITRATE (PF) 100 MCG/2ML IJ SOLN
INTRAMUSCULAR | Status: AC | PRN
  Administered 2018-05-29 (×2): 50 ug via INTRAVENOUS

## 2018-05-29 MED ORDER — LIDOCAINE HCL (PF) 1 % IJ SOLN
INTRAMUSCULAR | Status: AC | PRN
  Administered 2018-05-29: 5 mL

## 2018-05-29 MED ORDER — CEFAZOLIN SODIUM-DEXTROSE 2-4 GM/100ML-% IV SOLN
2.0000 g | INTRAVENOUS | Status: AC
  Administered 2018-05-29: 2 g via INTRAVENOUS
  Filled 2018-05-29: qty 100

## 2018-05-29 MED ORDER — SODIUM CHLORIDE 0.9 % IV SOLN
INTRAVENOUS | Status: DC
  Administered 2018-05-29: 11:00:00 via INTRAVENOUS

## 2018-05-29 NOTE — Consult Note (Signed)
Chief Complaint: Patient was seen in consultation today for Port-A-Cath placement  Referring Physician(s): Gorsuch,Ni  Supervising Physician: Markus Daft  Patient Status: [facility] - Out-pt  History of Present Illness: Holly Pena is a 53 y.o. female with history of newly diagnosed squamous cell carcinoma of the cervix, status post radical laparoscopic hysterectomy with bilateral salpingectomy and bilateral pelvic lymphadenectomy on 04/28/2018.  She presents today for Port-A-Cath placement for chemotherapy.  Past Medical History:  Diagnosis Date  . Cervical cancer ([place])   . Frequent headaches    due to lack of sleep  . Numbness    both hands and fingers  . PMB (postmenopausal bleeding)   . Seasonal allergies   . Squamous cell carcinoma in situ   . Tuberculosis    positive, CXR clear    Past Surgical History:  Procedure Laterality Date  . CESAREAN SECTION     x 2  . ENDOMETRIAL BIOPSY  03/10/2018  . lymph node surgery     at 33 months old, mother told her but not sure what surgery exactly  . PELVIC LYMPH NODE DISSECTION N/A 04/28/2018   Procedure: PELVIC LYMPH NODE DISSECTION;  Surgeon: Everitt Amber, MD;  Location: [facility] ORS;  Service: Gynecology;  Laterality: N/A;  . ROBOTIC ASSISTED TOTAL HYSTERECTOMY N/A 04/28/2018   Procedure: XI ROBOTIC ASSISTED RADICAL  HYSTERECTOMY;  Surgeon: Everitt Amber, MD;  Location: [facility];  Service: Gynecology;  Laterality: N/A;  . SALPINGOOPHORECTOMY Bilateral 04/28/2018   Procedure: Marilynn Rail SALPINGO OOPHORECTOMY;  Surgeon: Everitt Amber, MD;  Location: [facility] OR[facility];  Service: Gynecology;  Laterality: Bilateral;    Allergies: Patient has no known allergies.  Medications: Prior to Admission medications   Medication Sig Start Date End Date Taking? Authorizing Provider  Glucosamine-Chondroitin (MOVE FREE PO) Take 1 tablet by mouth daily.     [provider]  ibuprofen (ADVIL,MOTRIN) 600 MG tablet Take 1 tablet (600 mg total) by mouth every 6 (six)  hours as needed for mild pain. Patient not taking: Reported on 05/27/2018 04/29/18   Joylene John D, NP  lidocaine-prilocaine (EMLA) cream Apply to affected area once 05/20/18   Heath Lark, MD  ondansetron (ZOFRAN) 8 MG tablet Take 1 tablet (8 mg total) by mouth every 8 (eight) hours as needed. Start on the third day after chemotherapy. 05/20/18   Heath Lark, MD  prochlorperazine (COMPAZINE) 10 MG tablet Take 1 tablet (10 mg total) by mouth every 6 (six) hours as needed (Nausea or vomiting). 05/20/18   Heath Lark, MD  senna-docusate (SENOKOT-S) 8.6-50 MG tablet Take 2 tablets by mouth at bedtime. Do not take if having diarrhea/loose stools 04/29/18   Joylene John D, NP     Family History  Problem Relation Age of Onset  . Healthy Mother   . Hypertension Mother   . Healthy Father   . Cancer Neg Hx   . Heart disease Neg Hx     Social History   Socioeconomic History  . Marital status: Married    Spouse name: Not on file  . Number of children: 2  . Years of education: 76  . Highest education level: Not on file  Occupational History  . Occupation: Ship broker    Comment: [employer]  . Financial resource strain: Not on file  . Food insecurity:    Worry: Not on file    Inability: Not on file  . Transportation needs:    Medical: Not on file    Non-medical: Not on file  Tobacco  Use  . Smoking status: Never Smoker  . Smokeless tobacco: Never Used  Substance and Sexual Activity  . Alcohol use: Yes    Comment: occ  . Drug use: No  . Sexual activity: Yes    Partners: Male    Birth control/protection: Condom, Post-menopausal    Comment: married  Lifestyle  . Physical activity:    Days per week: Not on file    Minutes per session: Not on file  . Stress: Not on file  Relationships  . Social connections:    Talks on phone: Not on file    Gets together: Not on file    Attends religious service: Not on file    Active member of club or organization: Not on file    Attends  meetings of clubs or organizations: Not on file    Relationship status: Not on file  Other Topics Concern  . Not on file  Social History Narrative   Ms. Heaton is From [place].  She lives with her husband & 2 daughters.    She attends college.    Drinks caffeine,.   Wears her seatbelt. Smoke detector in the home.    Exercises routinely.   Feels safe in her relationships.      Review of Systems she denies fever, headache, chest pain, dyspnea, cough, abdominal/back pain, nausea, vomiting or bleeding.  Vital Signs: BP 105/75 (BP Location: Left Arm)   Pulse 63   Temp 98.1 F (36.7 C) (Oral)   Resp 18   LMP 02/20/2018 Comment: no period in 2 years  SpO2 100%   Physical Exam awake, alert.  Chest clear to auscultation bilaterally.  Heart with regular rate and rhythm.  Abdomen soft, positive bowel sounds, nontender.  Trace pretibial edema bilaterally.  Imaging: Ct Abdomen Pelvis W Contrast  Result Date: 05/12/2018 CLINICAL DATA:  Status post surgery for cervical cancer 2 days ago. Left lower quadrant abdominal pain and hematuria, acute onset. EXAM: CT ABDOMEN AND PELVIS WITH CONTRAST TECHNIQUE: Multidetector CT imaging of the abdomen and pelvis was performed using the standard protocol following bolus administration of intravenous contrast. CONTRAST:  125mL ISOVUE-300 IOPAMIDOL (ISOVUE-300) INJECTION 61% COMPARISON:  PET/CT performed 04/03/2018 FINDINGS: Lower chest: The visualized lung bases are grossly clear. The visualized portions of the mediastinum are unremarkable. Hepatobiliary: The liver is unremarkable in appearance. The gallbladder is unremarkable in appearance. The common bile duct remains normal in caliber. Pancreas: The pancreas is within normal limits. Spleen: The spleen is unremarkable in appearance. Adrenals/Urinary Tract: The adrenal glands are unremarkable in appearance. The kidneys are within normal limits. There is no evidence of hydronephrosis. No renal or ureteral stones are  identified. No perinephric stranding is seen. Stomach/Bowel: The stomach is unremarkable in appearance. The small bowel is within normal limits. The appendix is normal in caliber, without evidence of appendicitis. The colon is unremarkable in appearance. Vascular/Lymphatic: The abdominal aorta is unremarkable in appearance. The inferior vena cava is grossly unremarkable. No retroperitoneal lymphadenopathy is seen. No pelvic sidewall lymphadenopathy is identified. Reproductive: The bladder is decompressed, with a Foley catheter in place. Soft tissue inflammation about the bladder could reflect cystitis. Would correlate with the patient's symptoms. The patient is status post hysterectomy. No suspicious adnexal masses are seen. A small to moderate amount of free fluid is noted within the pelvis, of uncertain significance. Other: No additional soft tissue abnormalities are seen. Musculoskeletal: No acute osseous abnormalities are identified. The visualized musculature is unremarkable in appearance. IMPRESSION: 1. Soft tissue inflammation  about the bladder could reflect cystitis. Would correlate with the patient's symptoms. 2. Small to moderate amount of free fluid within the pelvis, of uncertain significance. This may simply be postoperative in nature, though if urine output from the Foley catheter decreases, further evaluation could be considered to exclude urine leak. Electronically Signed   By: Garald Balding M.D.   On: 05/12/2018 22:29    Labs:  CBC: Recent Labs    03/03/18 1645 04/24/18 1106 04/29/18 0527 04/29/18 1332 05/12/18 1913  WBC 4.6 4.4 6.2  --  5.1  HGB 13.4 13.1 11.2* 10.6* 12.1  HCT 39.4 41.3 35.4* 33.3* 36.9  PLT 273.0 175 182  --  297    COAGS: No results for input(s): INR, APTT in the last 8760 hours.  BMP: Recent Labs    03/03/18 1645 04/24/18 1106 04/29/18 0527 05/12/18 1913  NA 139 139 141 140  K 4.4 4.0 4.1 3.5  CL 104 106 108 106  CO2 28 25 27 25   GLUCOSE 86 94  116* 97  BUN 18 13 10 12   CALCIUM 9.7 9.4 8.6* 9.3  CREATININE 0.84 0.68 0.71 0.68  GFRNONAA  --  >60 >60 >60  GFRAA  --  >60 >60 >60    LIVER FUNCTION TESTS: Recent Labs    03/03/18 1645 04/24/18 1106  BILITOT 0.4 0.8  AST 13 21  ALT 7 14  ALKPHOS 52 52  PROT 6.8 7.0  ALBUMIN 4.2 4.4    TUMOR MARKERS: No results for input(s): AFPTM, CEA, CA199, CHROMGRNA in the last 8760 hours.  Assessment and Plan: 53 y.o. female with history of newly diagnosed squamous cell carcinoma of the cervix, status post radical laparoscopic hysterectomy with bilateral salpingectomy and bilateral pelvic lymphadenectomy on 04/28/2018.  She presents today for Port-A-Cath placement for chemotherapy.Risks and benefits of image guided port-a-catheter placement was discussed with the patient including, but not limited to bleeding, infection, pneumothorax, or fibrin sheath development and need for additional procedures.  All of the patient's questions were answered, patient is agreeable to proceed. Consent signed and in chart.  LABS PENDING   Thank you for this interesting consult.  I greatly enjoyed meeting Aubry Rankin and look forward to participating in their care.  A copy of this report was sent to the requesting provider on this date.  Electronically Signed: D. Rowe Robert, PA-C 05/29/2018, 10:34 AM   I spent a total of 25 minutes  in face to face in clinical consultation, greater than 50% of which was counseling/coordinating care for Port-A-Cath placement

## 2018-05-29 NOTE — Procedures (Signed)
Interventional Radiology Procedure:   Indications: Cervical cancer  Procedure: Port placement  Findings: Tip at SVC/RA junction  Complications: None     EBL: Minimal   Plan: May discharge in one hour.    Holly Pena R. Anselm Pancoast, MD  Pager: [phone]

## 2018-05-29 NOTE — Discharge Instructions (Signed)
Implanted Port Insertion, Care After °This sheet gives you information about how to care for yourself after your procedure. Your health care provider may also give you more specific instructions. If you have problems or questions, contact your health care provider. °What can I expect after the procedure? °After the procedure, it is common to have: °· Discomfort at the port insertion site. °· Bruising on the skin over the port. This should improve over 3-4 days. °Follow these instructions at home: °Port care °· After your port is placed, you will get a manufacturer's information card. The card has information about your port. Keep this card with you at all times. °· Take care of the port as told by your health care provider. Ask your health care provider if you or a family member can get training for taking care of the port at home. A home health care nurse may also take care of the port. °· Make sure to remember what type of port you have. °Incision care ° °  ° °· Follow instructions from your health care provider about how to take care of your port insertion site. Make sure you: °? Wash your hands with soap and water before and after you change your bandage (dressing). If soap and water are not available, use hand sanitizer. °? Change your dressing as told by your health care provider. °? Leave stitches (sutures), skin glue, or adhesive strips in place. These skin closures may need to stay in place for 2 weeks or longer. If adhesive strip edges start to loosen and curl up, you may trim the loose edges. Do not remove adhesive strips completely unless your health care provider tells you to do that. °· Check your port insertion site every day for signs of infection. Check for: °? Redness, swelling, or pain. °? Fluid or blood. °? Warmth. °? Pus or a bad smell. °Activity °· Return to your normal activities as told by your health care provider. Ask your health care provider what activities are safe for you. °· Do not  lift anything that is heavier than 10 lb (4.5 kg), or the limit that you are told, until your health care provider says that it is safe. °General instructions °· Take over-the-counter and prescription medicines only as told by your health care provider. °· Do not take baths, swim, or use a hot tub until your health care provider approves. Ask your health care provider if you may take showers. You may only be allowed to take sponge baths. °· Do not drive for 24 hours if you were given a sedative during your procedure. °· Wear a medical alert bracelet in case of an emergency. This will tell any health care providers that you have a port. °· Keep all follow-up visits as told by your health care provider. This is important. °Contact a health care provider if: °· You cannot flush your port with saline as directed, or you cannot draw blood from the port. °· You have a fever or chills. °· You have redness, swelling, or pain around your port insertion site. °· You have fluid or blood coming from your port insertion site. °· Your port insertion site feels warm to the touch. °· You have pus or a bad smell coming from the port insertion site. °Get help right away if: °· You have chest pain or shortness of breath. °· You have bleeding from your port that you cannot control. °Summary °· Take care of the port as told by your health   care provider. Keep the manufacturer's information card with you at all times. °· Change your dressing as told by your health care provider. °· Contact a health care provider if you have a fever or chills or if you have redness, swelling, or pain around your port insertion site. °· Keep all follow-up visits as told by your health care provider. °This information is not intended to replace advice given to you by your health care provider. Make sure you discuss any questions you have with your health care provider. °Document Released: 02/17/2013 Document Revised: 11/25/2017 Document Reviewed:  11/25/2017 °Elsevier Interactive Patient Education © 2019 [employer] °Implanted Port Home Guide °An implanted port is a device that is placed under the skin. It is usually placed in the chest. The device can be used to give IV medicine, to take blood, or for dialysis. You may have an implanted port if: °· You need IV medicine that would be irritating to the small veins in your hands or arms. °· You need IV medicines, such as antibiotics, for a long period of time. °· You need IV nutrition for a long period of time. °· You need dialysis. °Having a port means that your health care provider will not need to use the veins in your arms for these procedures. You may have fewer limitations when using a port than you would if you used other types of long-term IVs, and you will likely be able to return to normal activities after your incision heals. °An implanted port has two main parts: °· Reservoir. The reservoir is the part where a needle is inserted to give medicines or draw blood. The reservoir is round. After it is placed, it appears as a small, raised area under your skin. °· Catheter. The catheter is a thin, flexible tube that connects the reservoir to a vein. Medicine that is inserted into the reservoir goes into the catheter and then into the vein. °How is my port accessed? °To access your port: °· A numbing cream may be placed on the skin over the port site. °· Your health care provider will put on a mask and sterile gloves. °· The skin over your port will be cleaned carefully with a germ-killing soap and allowed to dry. °· Your health care provider will gently pinch the port and insert a needle into it. °· Your health care provider will check for a blood return to make sure the port is in the vein and is not clogged. °· If your port needs to remain accessed to get medicine continuously (constant infusion), your health care provider will place a clear bandage (dressing) over the needle site. The dressing and  needle will need to be changed every week, or as told by your health care provider. °What is flushing? °Flushing helps keep the port from getting clogged. Follow instructions from your health care provider about how and when to flush the port. Ports are usually flushed with saline solution or a medicine called heparin. The need for flushing will depend on how the port is used: °· If the port is only used from time to time to give medicines or draw blood, the port may need to be flushed: °? Before and after medicines have been given. °? Before and after blood has been drawn. °? As part of routine maintenance. Flushing may be recommended every 4-6 weeks. °· If a constant infusion is running, the port may not need to be flushed. °· Throw away any syringes in a disposal   container that is meant for sharp items (sharps container). You can buy a sharps container from a pharmacy, or you can make one by using an empty hard plastic bottle with a cover. °How long will my port stay implanted? °The port can stay in for as long as your health care provider thinks it is needed. When it is time for the port to come out, a surgery will be done to remove it. The surgery will be similar to the procedure that was done to put the port in. °Follow these instructions at home: ° °· Flush your port as told by your health care provider. °· If you need an infusion over several days, follow instructions from your health care provider about how to take care of your port site. Make sure you: °? Wash your hands with soap and water before you change your dressing. If soap and water are not available, use alcohol-based hand sanitizer. °? Change your dressing as told by your health care provider. °? Place any used dressings or infusion bags into a plastic bag. Throw that bag in the trash. °? Keep the dressing that covers the needle clean and dry. Do not get it wet. °? Do not use scissors or sharp objects near the tube. °? Keep the tube clamped,  unless it is being used. °· Check your port site every day for signs of infection. Check for: °? Redness, swelling, or pain. °? Fluid or blood. °? Pus or a bad smell. °· Protect the skin around the port site. °? Avoid wearing bra straps that rub or irritate the site. °? Protect the skin around your port from seat belts. Place a soft pad over your chest if needed. °· Bathe or shower as told by your health care provider. The site may get wet as long as you are not actively receiving an infusion. °· Return to your normal activities as told by your health care provider. Ask your health care provider what activities are safe for you. °· Carry a medical alert card or wear a medical alert bracelet at all times. This will let health care providers know that you have an implanted port in case of an emergency. °Get help right away if: °· You have redness, swelling, or pain at the port site. °· You have fluid or blood coming from your port site. °· You have pus or a bad smell coming from the port site. °· You have a fever. °Summary °· Implanted ports are usually placed in the chest for long-term IV access. °· Follow instructions from your health care provider about flushing the port and changing bandages (dressings). °· Take care of the area around your port by avoiding clothing that puts pressure on the area, and by watching for signs of infection. °· Protect the skin around your port from seat belts. Place a soft pad over your chest if needed. °· Get help right away if you have a fever or you have redness, swelling, pain, drainage, or a bad smell at the port site. °This information is not intended to replace advice given to you by your health care provider. Make sure you discuss any questions you have with your health care provider. °Document Released: 04/29/2005 Document Revised: 06/01/2016 Document Reviewed: 06/01/2016 °Elsevier Interactive Patient Education © 2019 [employer] °Moderate Conscious Sedation, Adult, Care  After °These instructions provide you with information about caring for yourself after your procedure. Your health care provider may also give you more specific instructions. Your treatment has   been planned according to current medical practices, but problems sometimes occur. Call your health care provider if you have any problems or questions after your procedure. °What can I expect after the procedure? °After your procedure, it is common: °· To feel sleepy for several hours. °· To feel clumsy and have poor balance for several hours. °· To have poor judgment for several hours. °· To vomit if you eat too soon. °Follow these instructions at home: °For at least 24 hours after the procedure: ° °· Do not: °? Participate in activities where you could fall or become injured. °? Drive. °? Use heavy machinery. °? Drink alcohol. °? Take sleeping pills or medicines that cause drowsiness. °? Make important decisions or sign legal documents. °? Take care of children on your own. °· Rest. °Eating and drinking °· Follow the diet recommended by your health care provider. °· If you vomit: °? Drink water, juice, or soup when you can drink without vomiting. °? Make sure you have little or no nausea before eating solid foods. °General instructions °· Have a responsible adult stay with you until you are awake and alert. °· Take over-the-counter and prescription medicines only as told by your health care provider. °· If you smoke, do not smoke without supervision. °· Keep all follow-up visits as told by your health care provider. This is important. °Contact a health care provider if: °· You keep feeling nauseous or you keep vomiting. °· You feel light-headed. °· You develop a rash. °· You have a fever. °Get help right away if: °· You have trouble breathing. °This information is not intended to replace advice given to you by your health care provider. Make sure you discuss any questions you have with your health care provider. °Document  Released: 02/17/2013 Document Revised: 10/02/2015 Document Reviewed: 08/19/2015 °Elsevier Interactive Patient Education © 2019 [employer] Inc. ° °

## 2018-06-01 ENCOUNTER — Telehealth: Payer: Self-pay | Admitting: Oncology

## 2018-06-01 NOTE — Telephone Encounter (Signed)
Called Isla and she said to call her interpreter at [phone].  Called interpreter and asked about [facility] appointment for 2 pm tomorrow.  She will check with patient and call back.

## 2018-06-02 ENCOUNTER — Inpatient Hospital Stay: Payer: BLUE CROSS/BLUE SHIELD

## 2018-06-02 ENCOUNTER — Ambulatory Visit
Admission: RE | Admit: 2018-06-02 | Discharge: 2018-06-02 | Disposition: A | Discharge status: Home / Self Care | Payer: BLUE CROSS/BLUE SHIELD | Source: Ambulatory Visit | Attending: Radiation Oncology | Admitting: Radiation Oncology

## 2018-06-02 DIAGNOSIS — C531 Malignant neoplasm of exocervix: Secondary | ICD-10-CM

## 2018-06-02 DIAGNOSIS — Z510 Encounter for antineoplastic radiation therapy: Secondary | ICD-10-CM | POA: Insufficient documentation

## 2018-06-02 LAB — COMPREHENSIVE METABOLIC PANEL
ALT: 13 U/L (ref 0–44)
AST: 17 U/L (ref 15–41)
Albumin: 4 g/dL (ref 3.5–5.0)
Alkaline Phosphatase: 54 U/L (ref 38–126)
Anion gap: 10 (ref 5–15)
BUN: 11 mg/dL (ref 6–20)
CHLORIDE: 105 mmol/L (ref 98–111)
CO2: 26 mmol/L (ref 22–32)
CREATININE: 0.7 mg/dL (ref 0.44–1.00)
Calcium: 9.5 mg/dL (ref 8.9–10.3)
GFR calc Af Amer: >60 mL/min (ref >60)
GFR calc non Af Amer: >60 mL/min (ref >60)
Glucose, Bld: 94 mg/dL (ref 70–99)
Potassium: 3.9 mmol/L (ref 3.5–5.1)
SODIUM: 141 mmol/L (ref 135–145)
Total Bilirubin: 0.5 mg/dL (ref 0.3–1.2)
Total Protein: 7.2 g/dL (ref 6.5–8.1)

## 2018-06-02 LAB — CBC WITH DIFFERENTIAL/PLATELET
ABS IMMATURE GRANULOCYTES: 0 10*3/uL (ref 0.00–0.07)
Basophils Absolute: 0 10*3/uL (ref 0.0–0.1)
Basophils Relative: 1 %
Eosinophils Absolute: 0.2 10*3/uL (ref 0.0–0.5)
Eosinophils Relative: 7 %
HEMATOCRIT: 37.9 % (ref 36.0–46.0)
Hemoglobin: 12.5 g/dL (ref 12.0–15.0)
Immature Granulocytes: 0 %
Lymphocytes Relative: 34 %
Lymphs Abs: 1.2 10*3/uL (ref 0.7–4.0)
MCH: 29.9 pg (ref 26.0–34.0)
MCHC: 33 g/dL (ref 30.0–36.0)
MCV: 90.7 fL (ref 80.0–100.0)
Monocytes Absolute: 0.3 10*3/uL (ref 0.1–1.0)
Monocytes Relative: 7 %
Neutro Abs: 1.7 10*3/uL (ref 1.7–7.7)
Neutrophils Relative %: 51 %
Platelets: 193 10*3/uL (ref 150–400)
RBC: 4.18 MIL/uL (ref 3.87–5.11)
RDW: 12.7 % (ref 11.5–15.5)
WBC: 3.4 10*3/uL — ABNORMAL LOW (ref 4.0–10.5)
nRBC: 0 % (ref 0.0–0.2)

## 2018-06-02 LAB — MAGNESIUM: Magnesium: 2 mg/dL (ref 1.7–2.4)

## 2018-06-02 MED ORDER — HEPARIN SOD (PORK) LOCK FLUSH 100 UNIT/ML IV SOLN
500.0000 [IU] | Freq: Once | INTRAVENOUS | Status: DC
  Filled 2018-06-02: qty 5

## 2018-06-02 MED ORDER — SODIUM CHLORIDE 0.9% FLUSH
10.0000 mL | Freq: Once | INTRAVENOUS | Status: DC
  Filled 2018-06-02: qty 10

## 2018-06-03 ENCOUNTER — Telehealth: Payer: Self-pay | Admitting: Oncology

## 2018-06-03 ENCOUNTER — Other Ambulatory Visit: Payer: Self-pay

## 2018-06-03 ENCOUNTER — Ambulatory Visit: Payer: BLUE CROSS/BLUE SHIELD | Attending: Hematology and Oncology | Admitting: Physical Therapy

## 2018-06-03 DIAGNOSIS — I890 Lymphedema, not elsewhere classified: Secondary | ICD-10-CM | POA: Diagnosis not present

## 2018-06-03 NOTE — Telephone Encounter (Signed)
Received a message from patient's interpreter about upcoming appointments.

## 2018-06-03 NOTE — Therapy (Signed)
[place], [place], [address] Phone: [phone]   Fax:  [phone]  Physical Therapy Treatment  Patient Details  Name: Holly Pena MRN: [id number] Date of Birth: July 13, 1965 Referring Provider (PT): Dr. Alvy Bimler    Encounter Date: 06/03/2018  PT End of Session - 06/03/18 1213    Visit Number  1    Number of Visits  4    Date for PT Re-Evaluation  08/02/18    PT Start Time  1115    PT Stop Time  1155    PT Time Calculation (min)  40 min    Activity Tolerance  Patient tolerated treatment well    Behavior During Therapy  [facility] for tasks assessed/performed       Past Medical History:  Diagnosis Date  . Cervical cancer ([place])   . Frequent headaches    due to lack of sleep  . Numbness    both hands and fingers  . PMB (postmenopausal bleeding)   . Seasonal allergies   . Squamous cell carcinoma in situ   . Tuberculosis    positive, CXR clear    Past Surgical History:  Procedure Laterality Date  . CESAREAN SECTION     x 2  . ENDOMETRIAL BIOPSY  03/10/2018  . IR IMAGING GUIDED PORT INSERTION  05/29/2018  . lymph node surgery     at 72 months old, mother told her but not sure what surgery exactly  . PELVIC LYMPH NODE DISSECTION N/A 04/28/2018   Procedure: PELVIC LYMPH NODE DISSECTION;  Surgeon: Everitt Amber, MD;  Location: [facility];  Service: Gynecology;  Laterality: N/A;  . ROBOTIC ASSISTED TOTAL HYSTERECTOMY N/A 04/28/2018   Procedure: XI ROBOTIC ASSISTED RADICAL  HYSTERECTOMY;  Surgeon: Everitt Amber, MD;  Location: [facility];  Service: Gynecology;  Laterality: N/A;  . SALPINGOOPHORECTOMY Bilateral 04/28/2018   Procedure: Marilynn Rail SALPINGO OOPHORECTOMY;  Surgeon: Everitt Amber, MD;  Location: [facility];  Service: Gynecology;  Laterality: Bilateral;    There were no vitals filed for this visit.  Subjective Assessment - 06/03/18 1120    Subjective  Pt feels that she had swelling in her bilateral inguinal areas.  She said the swelling in her lower legs is much better  She got a port placed in right upper chest  3 days ago     Pertinent History  she underwent robotic assisted type III radical laparoscopic hysterectomy with bilateral salpingectomy 04/28/2018 She will be getting chemo and radiation at the same time  She has swelling after surgery more in the right leg, but in the left leg also. It is better now, but she still feels it in both inguinal areas     Patient Stated Goals  to learn what to do about the swelling in her groin     Currently in Pain?  No/denies         [facility] PT Assessment - 06/03/18 0001      Assessment   Medical Diagnosis  cervical cancer     Referring Provider (PT)  Dr. Alvy Bimler     Onset Date/Surgical Date  04/29/19      Restrictions   Weight Bearing Restrictions  No      Balance Screen   Has the patient fallen in the past 6 months  No    Has the patient had a decrease in activity level because of a fear of falling?   No    Is the patient reluctant to leave their home because  of a fear of falling?   No      Home Environment   Living Environment  Private residence    Living Arrangements  Children    Available Help at Discharge  Available PRN/intermittently      Prior Function   Level of Independence  Independent    Vocation  Student   studying business    Vocation Requirements  not going to school this semester     Leisure  used to walk about 5 days a week 3-5  and did yoga 5 days a week       Cognition   Overall Cognitive Status  Within Functional Limits for tasks assessed      Observation/Other Assessments   Observations  pt with some visible fullness in both medial upper thighs and inguinal areas       Sensation   Light Touch  Appears Intact      Coordination   Gross Motor Movements are Fluid and Coordinated  Yes      Posture/Postural Control   Posture/Postural Control  No significant limitations      ROM / Strength   AROM / PROM / Strength   AROM;Strength      AROM   Overall AROM   Within functional limits for tasks performed      Strength   Overall Strength  Within functional limits for tasks performed    Overall Strength Comments  pt reports she is doing much better and does not feel weak or fatigued         LYMPHEDEMA/ONCOLOGY QUESTIONNAIRE - 06/03/18 1137      Type   Cancer Type  cervical       Surgeries   Other Surgery Date  04/28/18   robotic assisted laparoscopic total hysterectomy    Number Lymph Nodes Removed  15      Treatment   Active Chemotherapy Treatment  Yes    Date  06/12/18    Active Radiation Treatment  Yes    Date  06/12/18      What other symptoms do you have   Are you Having Heaviness or Tightness  Yes      Right Lower Extremity Lymphedema   At Groin Measure at Horizontal from Pubic Bone  64.5 cm   diagonal from fullest medial thigh along inguinal line    20 cm Proximal to Suprapatella  59 cm    10 cm Proximal to Suprapatella  48 cm    At Midpatella/Popliteal Crease  32 cm   crease   30 cm Proximal to Floor at Lateral Plantar Foot  35 cm    20 cm Proximal to Floor at Lateral Plantar Foot  27.5 1    10  cm Proximal to Floor at Lateral Malleoli  20 cm    5 cm Proximal to 1st MTP Joint  20 cm    Around Proximal Great Toe  7.5 cm      Left Lower Extremity Lymphedema   At Groin Measure at Horizontal from Pubic Bone  64 cm   diagonal at fullest part of medial upper  thigh    20 cm Proximal to Suprapatella  58.2 cm    10 cm Proximal to Suprapatella  48 cm    At Midpatella/Popliteal Crease  32.9 cm   crease   30 cm Proximal to Floor at Lateral Plantar Foot  35 cm    20 cm Proximal to Floor at Lateral Plantar Foot  27 cm  10 cm Proximal to Floor at Lateral Malleoli  20 cm    5 cm Proximal to 1st MTP Joint  20.7 cm    Around Proximal Great Toe  7 cm                [facility] Adult PT Treatment/Exercise - 06/03/18 0001      Self-Care   Self-Care  Other Self-Care Comments     Other Self-Care Comments   gave pt 2 chip packs to place in bike short or running pant with some compression at inguinal areas or upper thighs.  instructed in diaphragmatic breathing and abdominal stationary circles to begin manual lymph drainage                PT Short Term Goals - 06/03/18 1221      PT SHORT TERM GOAL #1   Title  Pt will be independen in MLD, use of compression , elevation and exericse to manage lymphedema at home     Time  8    Period  Weeks    Status  New      PT SHORT TERM GOAL #2   Title  Pt will have no increase in lymphedema symptoms after radiation treatment     Time  8    Period  Weeks    Status  New               Plan - 06/03/18 1214    Clinical Impression Statement  Pt is 53 yo with cervical cancer who had robotic assisted hysterectomy with removeal of  15 lymph nodes.  She had mild lymphedema post op that has resolved distally, but she still feels fullness in inguinal areas.  She will have radiation that may further damage lymph nodes.  She will benefit from PT to learn to do MLD and use compression to help manage her symptoms of swelling that may occur  with futher treatment Anticipate she will be able to learn this in a couple of sessions and will ask her to come back after radiation is complete to recheck     History and Personal Factors relevant to plan of care:  recent surgery with episode of leg swelling, , will have many appointments for chemo and radiation     Clinical Presentation  Evolving    Clinical Presentation due to:  will be having chemo and radiation     Clinical Decision Making  Moderate    Rehab Potential  Excellent    PT Frequency  --   2 treatments within the next week, then after radiation is complete    PT Duration  8 weeks    PT Treatment/Interventions  ADLs/Self Care Home Management;Therapeutic exercise;Orthotic Fit/Training;Patient/family education;Taping;Compression bandaging;Manual lymph drainage    PT Next Visit  Plan  Teach MLD to legs  with emphasis on proximal thigh and groin ( consider lending Klose video) assess chip packs, teach remedial exercise and elevation     Consulted and Agree with Plan of Care  Patient       Patient will benefit from skilled therapeutic intervention in order to improve the following deficits and impairments:  Decreased knowledge of use of DME, Increased edema, Decreased knowledge of precautions  Visit Diagnosis: Lymphedema, not elsewhere classified - Plan: PT plan of care cert/re-cert     Problem List Patient Active Problem List   Diagnosis Date Noted  . Other specified counseling 05/27/2018  . Physical debility 05/20/2018  . Other constipation 05/14/2018  .  Cervical cancer ([place]) 04/28/2018  . Perimenopause 08/13/2016  . Frequent headaches    Donato Heinz. Owens Shark PT  Norwood Levo 06/03/2018, 12:24 PM  [place], [place], [address] Phone: [phone]   Fax:  [phone]  Name: Holly Pena MRN: [id number] Date of Birth: 1965-10-21

## 2018-06-04 ENCOUNTER — Telehealth: Payer: Self-pay | Admitting: Oncology

## 2018-06-04 ENCOUNTER — Ambulatory Visit: Payer: BLUE CROSS/BLUE SHIELD | Admitting: Physical Therapy

## 2018-06-04 DIAGNOSIS — I890 Lymphedema, not elsewhere classified: Secondary | ICD-10-CM

## 2018-06-04 NOTE — Telephone Encounter (Signed)
Holly Pena called and asked if the lab appointment on 06/10/18 could be moved before her appointment with Dr. Alvy Bimler on 06/11/18.  Advised her that we can move the appointment but it would need to be at 11:45 on 1/30 so that Dr. Alvy Bimler has the results.  She said to keep the appointment on 1/29 because she has an another appointment at that time on 1/30.

## 2018-06-04 NOTE — Therapy (Signed)
[place], [place], [address] Phone: [phone]   Fax:  [phone]  Physical Therapy Treatment  Patient Details  Name: Holly Pena MRN: [id number] Date of Birth: 1965-08-06 Referring Provider (PT): Dr. Alvy Bimler    Encounter Date: 06/04/2018  PT End of Session - 06/04/18 1955    Visit Number  2    Number of Visits  4    Date for PT Re-Evaluation  08/02/18    PT Start Time  9702    PT Stop Time  1430    PT Time Calculation (min)  45 min    Activity Tolerance  Patient tolerated treatment well    Behavior During Therapy  [facility] for tasks assessed/performed       Past Medical History:  Diagnosis Date  . Cervical cancer ([place])   . Frequent headaches    due to lack of sleep  . Numbness    both hands and fingers  . PMB (postmenopausal bleeding)   . Seasonal allergies   . Squamous cell carcinoma in situ   . Tuberculosis    positive, CXR clear    Past Surgical History:  Procedure Laterality Date  . CESAREAN SECTION     x 2  . ENDOMETRIAL BIOPSY  03/10/2018  . IR IMAGING GUIDED PORT INSERTION  05/29/2018  . lymph node surgery     at 68 months old, mother told her but not sure what surgery exactly  . PELVIC LYMPH NODE DISSECTION N/A 04/28/2018   Procedure: PELVIC LYMPH NODE DISSECTION;  Surgeon: Everitt Amber, MD;  Location: [facility];  Service: Gynecology;  Laterality: N/A;  . ROBOTIC ASSISTED TOTAL HYSTERECTOMY N/A 04/28/2018   Procedure: XI ROBOTIC ASSISTED RADICAL  HYSTERECTOMY;  Surgeon: Everitt Amber, MD;  Location: [facility];  Service: Gynecology;  Laterality: N/A;  . SALPINGOOPHORECTOMY Bilateral 04/28/2018   Procedure: Marilynn Rail SALPINGO OOPHORECTOMY;  Surgeon: Everitt Amber, MD;  Location: [facility];  Service: Gynecology;  Laterality: Bilateral;    There were no vitals filed for this visit.  Subjective Assessment - 06/04/18 1948    Subjective  Pt said she didn't try the chip packs or compression yoga pants  yet    Patient is accompained by:  Interpreter    Pertinent History  she underwent robotic assisted type III radical laparoscopic hysterectomy with bilateral salpingectomy 04/28/2018 She will be getting chemo and radiation at the same time  She has swelling after surgery more in the right leg, but in the left leg also. It is better now, but she still feels it in both inguinal areas     Patient Stated Goals  to learn what to do about the swelling in her groin     Currently in Pain?  No/denies                       [facility] Adult PT Treatment/Exercise - 06/04/18 0001      Exercises   Exercises  Lumbar      Lumbar Exercises: Supine   Other Supine Lumbar Exercises  transverse abds series      Lumbar Exercises: Sidelying   Clam  Left;5 reps    Hip Abduction  Left;5 reps    Other Sidelying Lumbar Exercises  instruced in hip extension to open up inguinal areas for lymph flow      Manual Therapy   Manual Therapy  Manual Lymphatic Drainage (MLD)    Manual therapy comments  pt give written handout  and  Klose training video to practice at home     Manual Lymphatic Drainage (MLD)  in supine with head of bed elevated, instucted and cued pt with verbal and hand over hand instructions:  short neck on left (avoiding port side), superficial and deep abdominals, left axillary nodes, left inginao-axillo anastamosis, left lateral hip , groin upper leg and medial thigh with leg supported in flexed and abduction position on pillow so pt could reach edematous areas better.  Pt able to reproduece technique and said she understood              PT Education - 06/04/18 1954    Education Details  self manual lymph drainage and LE and abdominal remedial exercise     Person(s) Educated  Patient    Methods  Explanation;Demonstration;Tactile cues;Verbal cues;Handout;Other (comment)   klose video   Comprehension  Verbalized understanding;Need further instruction       PT Short Term Goals -  06/03/18 1221      PT SHORT TERM GOAL #1   Title  Pt will be independen in MLD, use of compression , elevation and exericse to manage lymphedema at home     Time  8    Period  Weeks    Status  New      PT SHORT TERM GOAL #2   Title  Pt will have no increase in lymphedema symptoms after radiation treatment     Time  8    Period  Weeks    Status  New               Plan - 06/04/18 1956    Clinical Impression Statement  Pt was able to demonstrate self manual lymph drainage and exercise but feel she will need review for full independence. She has lymphedema in lower abdomen and upper thighs.  Feel she will get benefit also from compression.  Showed her pictures of tribute night and flat knit garments that were available if she did not get adequate relief from athletic pants and bike shorts with chip pack     PT Treatment/Interventions  ADLs/Self Care Home Management;Therapeutic exercise;Orthotic Fit/Training;Patient/family education;Taping;Compression bandaging;Manual lymph drainage    PT Next Visit Plan  Review MLD to legs  with emphasis on proximal thigh and groin  assess chip packs, review remedial exercise and elevation     Consulted and Agree with Plan of Care  Patient       Patient will benefit from skilled therapeutic intervention in order to improve the following deficits and impairments:     Visit Diagnosis: Lymphedema, not elsewhere classified     Problem List Patient Active Problem List   Diagnosis Date Noted  . Other specified counseling 05/27/2018  . Physical debility 05/20/2018  . Other constipation 05/14/2018  . Cervical cancer ([place]) 04/28/2018  . Perimenopause 08/13/2016  . Frequent headaches    Donato Heinz. Owens Shark PT  Norwood Levo 06/04/2018, 8:00 PM  [place], [place], [address] Phone: [phone]   Fax:  [phone]  Name: Delania Ferg MRN: [id number] Date of Birth:  [birth year]

## 2018-06-04 NOTE — Patient Instructions (Signed)

## 2018-06-05 ENCOUNTER — Inpatient Hospital Stay: Payer: BLUE CROSS/BLUE SHIELD

## 2018-06-07 NOTE — Treatment Plan (Signed)
  Radiation Oncology         [phone] ________________________________  Name: Holly Pena MRN: [id number]  Date: 06/02/2018  DOB: 1965-07-26  SIMULATION AND TREATMENT PLANNING NOTE    ICD-10-CM   1. Malignant neoplasm of exocervix ([place]) C53.1     DIAGNOSIS: Stage III-C (pT1b2, pN1) poorly differentiated squamous cell carcinoma of the cervix    NARRATIVE:  The patient was brought to the [facility].  Identity was confirmed.  All relevant records and images related to the planned course of therapy were reviewed.  The patient freely provided informed written consent to proceed with treatment after reviewing the details related to the planned course of therapy. The consent form was witnessed and verified by the simulation staff.  Then, the patient was set-up in a stable reproducible  supine position for radiation therapy.  CT images were obtained.  Surface markings were placed.  The CT images were loaded into the planning software.  Then the target and avoidance structures were contoured.  Treatment planning then occurred.  The radiation prescription was entered and confirmed.  Then, I designed and supervised the construction of a total of 2 medically necessary complex treatment devices.  I have requested : Intensity Modulated Radiotherapy (IMRT) is medically necessary for this case for the following reason:  Small bowel sparing..  I have ordered:CBC  PLAN:  The patient will receive 45 Gy in 25 fractions followed by a brachytherapy boost or external beam boost of 5.4 Gray directed to the central pelvis area.   Special Treatment Procedure Note: The patient will be receiving radiosensitizing chemotherapy. Given the potential of increased toxicities related to combined therapy and the necessity for close monitoring of the patient and blood work, this constitutes a special treatment procedure.  -----------------------------------  Blair Promise, PhD, MD

## 2018-06-08 ENCOUNTER — Telehealth: Payer: Self-pay | Admitting: Oncology

## 2018-06-08 NOTE — Telephone Encounter (Signed)
Verified with Nicoletta Dress that she is aware of upcoming appointments for labs and with Dr. Alvy Bimler.  She said she can see them on MyChart.

## 2018-06-09 ENCOUNTER — Encounter: Payer: Self-pay | Admitting: Physical Therapy

## 2018-06-09 ENCOUNTER — Ambulatory Visit: Payer: BLUE CROSS/BLUE SHIELD | Admitting: Physical Therapy

## 2018-06-09 DIAGNOSIS — I890 Lymphedema, not elsewhere classified: Secondary | ICD-10-CM

## 2018-06-09 DIAGNOSIS — Z510 Encounter for antineoplastic radiation therapy: Secondary | ICD-10-CM | POA: Diagnosis not present

## 2018-06-09 NOTE — Therapy (Addendum)
[place], [place], [address] Phone: [phone]   Fax:  [phone]  Physical Therapy Treatment  Patient Details  Name: Holly Pena MRN: [id number] Date of Birth: 09-03-1965 Referring Provider (PT): Dr. Alvy Bimler    Encounter Date: 06/09/2018  PT End of Session - 06/09/18 1328    Visit Number  3    Number of Visits  4    Date for PT Re-Evaluation  08/02/18    PT Start Time  1771    PT Stop Time  1100    PT Time Calculation (min)  45 min    Activity Tolerance  Patient tolerated treatment well    Behavior During Therapy  [facility] for tasks assessed/performed       Past Medical History:  Diagnosis Date  . Cervical cancer ([place])   . Frequent headaches    due to lack of sleep  . Numbness    both hands and fingers  . PMB (postmenopausal bleeding)   . Seasonal allergies   . Squamous cell carcinoma in situ   . Tuberculosis    positive, CXR clear    Past Surgical History:  Procedure Laterality Date  . CESAREAN SECTION     x 2  . ENDOMETRIAL BIOPSY  03/10/2018  . IR IMAGING GUIDED PORT INSERTION  05/29/2018  . lymph node surgery     at 86 months old, mother told her but not sure what surgery exactly  . PELVIC LYMPH NODE DISSECTION N/A 04/28/2018   Procedure: PELVIC LYMPH NODE DISSECTION;  Surgeon: Everitt Amber, MD;  Location: [facility];  Service: Gynecology;  Laterality: N/A;  . ROBOTIC ASSISTED TOTAL HYSTERECTOMY N/A 04/28/2018   Procedure: XI ROBOTIC ASSISTED RADICAL  HYSTERECTOMY;  Surgeon: Everitt Amber, MD;  [facility]: [facility];  Service: Gynecology;  Laterality: N/A;  . SALPINGOOPHORECTOMY Bilateral 04/28/2018   Procedure: Marilynn Rail SALPINGO OOPHORECTOMY;  Surgeon: Everitt Amber, MD;  Location: [facility];  Service: Gynecology;  Laterality: Bilateral;    There were no vitals filed for this visit.  Subjective Assessment - 06/09/18 1323    Subjective  Pt said she has been using the chip packs and they seem to help,  but she needs bigger chip packs. She feels she can do the MLD at home. Chemo and Radiaton start Friday  She still feels full in her lower abdomen and inguinal area     Patient is accompained by:  Interpreter    Pertinent History  she underwent robotic assisted type III radical laparoscopic hysterectomy with bilateral salpingectomy 04/28/2018 She will be getting chemo and radiation at the same time  She has swelling after surgery more in the right leg, but in the left leg also. It is better now, but she still feels it in both inguinal areas     Patient Stated Goals  to learn what to do about the swelling in her groin     Currently in Pain?  No/denies                       [facility] Adult PT Treatment/Exercise - 06/09/18 0001      Manual Therapy   Manual Therapy  Edema management;Manual Lymphatic Drainage (MLD)    Manual therapy comments  upgraded to big dotted foam for abdomen and right medial thigh     Manual Lymphatic Drainage (MLD)  in supine with head of bed elevated, instucted and cued pt with verbal and hand over hand instructions:  short  neck on left (avoiding port side), superficial and deep abdominals, left axillary nodes, left inginao-axillo anastamosis, left lateral hip , Then to sidelyin for hip and back .  then same to right side.  Pt has palpable fullness in right medial thigh              PT Education - 06/09/18 1327    Education Details  use of chip packs for compression     Methods  Explanation    Comprehension  Verbalized understanding       PT Short Term Goals - 06/09/18 1331      PT SHORT TERM GOAL #1   Title  Pt will be independen in MLD, use of compression , elevation and exericse to manage lymphedema at home     Period  Weeks    Status  Achieved      PT SHORT TERM GOAL #2   Title  Pt will have no increase in lymphedema symptoms after radiation treatment     Time  8    Period  Weeks    Status  On-going               Plan - 06/09/18  1329    Clinical Impression Statement  Pt continues to have fullness in lower abdomen and upper medial thighs, especially on the right, but she feels she can manage with self MLD and use of compression in running pants.  She has no more appointments for now but will call to schedule at the end of radiation or during it if she needs to.     Rehab Potential  Excellent    PT Treatment/Interventions  ADLs/Self Care Home Management;Therapeutic exercise;Orthotic Fit/Training;Patient/family education;Taping;Compression bandaging;Manual lymph drainage    PT Next Visit Plan  Remeasure, perfrom MLD to legs  with emphasis on proximal thigh and groin  assess chip packs, review remedial exercise and elevation , upgrade compression as needed     Consulted and Agree with Plan of Care  Patient       Patient will benefit from skilled therapeutic intervention in order to improve the following deficits and impairments:  Decreased knowledge of use of DME, Increased edema, Decreased knowledge of precautions  Visit Diagnosis: Lymphedema, not elsewhere classified     Problem List Patient Active Problem List   Diagnosis Date Noted  . Other specified counseling 05/27/2018  . Physical debility 05/20/2018  . Other constipation 05/14/2018  . Cervical cancer ([place]) 04/28/2018  . Perimenopause 08/13/2016  . Frequent headaches    Donato Heinz. Owens Shark, PT  Norwood Levo 06/09/2018, 1:31 PM  [place], [place], [address] Phone: [phone]   Fax:  [phone]  Name: Holly Pena MRN: [id number] Date of Birth: 1965/06/26  PHYSICAL THERAPY DISCHARGE SUMMARY  Visits from Start of Care: 3  Current functional level related to goals / functional outcomes: unknown   Remaining deficits: unknown   Education / Equipment: Lymphedema managment  Plan: Patient agrees to discharge.  Patient goals were not met. Patient is being discharged due  to not returning since the last visit.  ?????    Maudry Diego, PT 08/03/18 2:24 PM

## 2018-06-10 ENCOUNTER — Encounter: Payer: Self-pay | Admitting: Hematology and Oncology

## 2018-06-10 ENCOUNTER — Inpatient Hospital Stay: Payer: BLUE CROSS/BLUE SHIELD | Admitting: Hematology and Oncology

## 2018-06-10 ENCOUNTER — Inpatient Hospital Stay: Payer: BLUE CROSS/BLUE SHIELD

## 2018-06-10 ENCOUNTER — Telehealth: Payer: Self-pay | Admitting: Hematology and Oncology

## 2018-06-10 VITALS — BP 109/60 | HR 67 | Temp 97.9°F | Resp 18 | Ht 63.0 in | Wt 143.4 lb

## 2018-06-10 DIAGNOSIS — C531 Malignant neoplasm of exocervix: Secondary | ICD-10-CM

## 2018-06-10 DIAGNOSIS — Z90722 Acquired absence of ovaries, bilateral: Secondary | ICD-10-CM

## 2018-06-10 DIAGNOSIS — Z79899 Other long term (current) drug therapy: Secondary | ICD-10-CM

## 2018-06-10 DIAGNOSIS — D72819 Decreased white blood cell count, unspecified: Secondary | ICD-10-CM | POA: Diagnosis not present

## 2018-06-10 DIAGNOSIS — R5382 Chronic fatigue, unspecified: Secondary | ICD-10-CM

## 2018-06-10 DIAGNOSIS — Z791 Long term (current) use of non-steroidal anti-inflammatories (NSAID): Secondary | ICD-10-CM

## 2018-06-10 DIAGNOSIS — Z90710 Acquired absence of both cervix and uterus: Secondary | ICD-10-CM | POA: Diagnosis not present

## 2018-06-10 DIAGNOSIS — Z510 Encounter for antineoplastic radiation therapy: Secondary | ICD-10-CM | POA: Diagnosis not present

## 2018-06-10 DIAGNOSIS — I890 Lymphedema, not elsewhere classified: Secondary | ICD-10-CM | POA: Insufficient documentation

## 2018-06-10 LAB — CBC WITH DIFFERENTIAL/PLATELET
ABS IMMATURE GRANULOCYTES: 0.01 10*3/uL (ref 0.00–0.07)
Basophils Absolute: 0.1 10*3/uL (ref 0.0–0.1)
Basophils Relative: 1 %
Eosinophils Absolute: 0.3 10*3/uL (ref 0.0–0.5)
Eosinophils Relative: 7 %
HCT: 40.5 % (ref 36.0–46.0)
Hemoglobin: 13.3 g/dL (ref 12.0–15.0)
Immature Granulocytes: 0 %
Lymphocytes Relative: 33 %
Lymphs Abs: 1.2 10*3/uL (ref 0.7–4.0)
MCH: 29.9 pg (ref 26.0–34.0)
MCHC: 32.8 g/dL (ref 30.0–36.0)
MCV: 91 fL (ref 80.0–100.0)
Monocytes Absolute: 0.3 10*3/uL (ref 0.1–1.0)
Monocytes Relative: 8 %
Neutro Abs: 1.8 10*3/uL (ref 1.7–7.7)
Neutrophils Relative %: 51 %
Platelets: 180 10*3/uL (ref 150–400)
RBC: 4.45 MIL/uL (ref 3.87–5.11)
RDW: 12.5 % (ref 11.5–15.5)
WBC: 3.7 10*3/uL — ABNORMAL LOW (ref 4.0–10.5)
nRBC: 0 % (ref 0.0–0.2)

## 2018-06-10 LAB — COMPREHENSIVE METABOLIC PANEL
ALT: 11 U/L (ref 0–44)
AST: 16 U/L (ref 15–41)
Albumin: 3.9 g/dL (ref 3.5–5.0)
Alkaline Phosphatase: 53 U/L (ref 38–126)
Anion gap: 8 (ref 5–15)
BUN: 16 mg/dL (ref 6–20)
CO2: 29 mmol/L (ref 22–32)
Calcium: 9.7 mg/dL (ref 8.9–10.3)
Chloride: 105 mmol/L (ref 98–111)
Creatinine, Ser: 0.86 mg/dL (ref 0.44–1.00)
GFR calc Af Amer: >60 mL/min (ref >60)
GFR calc non Af Amer: >60 mL/min (ref >60)
Glucose, Bld: 76 mg/dL (ref 70–99)
Potassium: 4.3 mmol/L (ref 3.5–5.1)
SODIUM: 142 mmol/L (ref 135–145)
Total Bilirubin: 0.4 mg/dL (ref 0.3–1.2)
Total Protein: 7.2 g/dL (ref 6.5–8.1)

## 2018-06-10 LAB — MAGNESIUM: Magnesium: 1.8 mg/dL (ref 1.7–2.4)

## 2018-06-10 NOTE — Assessment & Plan Note (Signed)
She has recent mild leukopenia, resolving.  There is no contraindication for her to proceed

## 2018-06-10 NOTE — Assessment & Plan Note (Signed)
She had mild lymphedema on the right leg since surgery She is undergoing physical therapy.  She will continue the same

## 2018-06-10 NOTE — Progress Notes (Signed)
[place] OFFICE PROGRESS NOTE  Patient Care Team: Holly Kern, DO as PCP - General (Family Medicine)  ASSESSMENT & PLAN:  Cervical cancer [facility]) We discussed the role of chemotherapy. The intent is of curative intent.  We discussed some of the risks, benefits, side-effects of cisplatin and its role as chemo sensitizing agent. The plan for weekly cisplatin for x5 doses along with radiation treatment.  Some of the short term side-effects included, though not limited to, including weight loss, life threatening infections, risk of allergic reactions, need for transfusions of blood products, nausea, vomiting, change in bowel habits, loss of hair, admission to hospital for various reasons, and risks of death.   Long term side-effects are also discussed including risks of infertility, permanent damage to nerve function, hearing loss, chronic fatigue, kidney damage with possibility needing hemodialysis, and rare secondary malignancy including bone marrow disorders.  The patient is aware that the response rates discussed earlier is not guaranteed.  After a long discussion, patient made an informed decision to proceed with the prescribed plan of care.     Leukopenia She has recent mild leukopenia, resolving.  There is no contraindication for her to proceed  Lymphedema of right lower extremity She had mild lymphedema on the right leg since surgery She is undergoing physical therapy.  She will continue the same   No orders of the defined types were placed in this encounter.   INTERVAL HISTORY: Please see below for problem oriented charting. She returns with her husband and [place] interpreter She felt better since last time I saw her Urinary retention has resolved She is undergoing physical therapy for right leg lymphedema She denies nausea or constipation  SUMMARY OF ONCOLOGIC HISTORY:   Cervical cancer ([place])   02/20/2018 Initial Diagnosis    She presented with  postmenopausal bleeding    03/10/2018 Pathology Results    Endometrium, biopsy - SQUAMOUS CELL CARCINOMA. - SEE COMMENT.    03/17/2018 Imaging    US pelvis: Endometrium measures 7 mm. In the setting of post-menopausal bleeding, endometrial sampling is indicated to exclude carcinoma    03/24/2018 Procedure    Pre-operative Diagnosis:  Suspect SCCa Cervix based on endometrial biopsy  Post-operative Diagnosis:  Stage IB1 (FIGO 2018) SCCa Cervix  Operation:  Exam under anesthesia Cervical biopsies  Operative Findings:  Anterior cervical lip with gross lesion from ~10-12:00. Remainder of cervix grossly normal. Estimate lesion to be <2cm. No vaginal or parametrial involvement.    03/30/2018 Imaging    CT scan of chest, abdomen and pelvis: 1. Isolated low-density structure lateral to the descending colon. Differential considerations include isolated peritoneal implant/metastasis, GI duplication/mesenteric cyst, or postoperative collection such as seroma (clinical history only describes prior Caesarean section). Consider further evaluation with PET. 2. Otherwise, no evidence of metastatic disease in the chest, abdomen, or pelvis.    04/04/2018 PET scan    1. Prominent hypermetabolism within the cervix, consistent with known cervical cancer. No parametrial extension of tumor or abdominopelvic nodal metastatic disease. 2. The previously demonstrated low-density structure posterior to the descending colon demonstrates no hypermetabolic activity and is likely an incidental duplication/mesenteric cyst. 3. Focal hypermetabolic activity within a single right neck lymph node (level 1B). This is unlikely to be related to the patient's cervical cancer. No hypermetabolism is seen within the pharyngeal mucosal space. ENT evaluation should be considered, especially if the patient has risk factors for head and neck malignancy.    04/28/2018 Surgery    Surgeon: Holly Pena  Holly Pena  Pre-operative  Diagnosis: stage IB2 cervical cancer  Post-operative Diagnosis: same  Operation: Robotic-assisted type III radical laparoscopic hysterectomy with bilateral salpingectomy and bilateral pelvic lymphadenectomy  Surgeon: Holly Pena  Operative Findings:  : 3.5cm tumor replacing the anterior and right cervix. No gross parametrial involvement.         Specimens: left and right pelvic nodes, uterus with cervix, bilateral tubes and upper vagina. Anterior vaginal margin with marking stitch at 12 o'clock of new true distal anterior vaginal margin         Complications:  None; patient tolerated the procedure well.             04/28/2018 Pathology Results    1. Lymph nodes, regional resection, right pelvic - EIGHT BENIGN LYMPH NODES (0/8). 2. Lymph nodes, regional resection, left pelvic - SEVEN BENIGN LYMPH NODES (0/7). 3. Uterus, cervix and bilateral fallopian tubes, upper vagina CERVIX: - INVASIVE POORLY DIFFERENTIATED SQUAMOUS CELL CARCINOMA, 4.5 X 2.6 X 1.0 CM. - MARGINS NOT INVOLVED. - LYMPHOVASCULAR INVOLVEMENT BY TUMOR. - METASTATIC CARCINOMA IN TWO OF SIX PARACERVICAL LYMPH NODES (2/6). BENIGN ENDOMETRIUM AND MYOMETRIUM SEE ONCOLOGY TABLE. 4. Vagina, biopsy, anterior vaginal margin - SQUAMOUS MUCOSA WITH SLIGHT INFLAMMATION. - NO EVIDENCE OF INVASIVE CARCINOMA. Microscopic Comment 3. UTERINE CERVIX: Resection  Procedure: Hysterectomy with right and left pelvic regional lymph nodes and anterior vaginal margin. Tumor Size: 4.5 x 2.6 x 1.0 cm. Histologic Type: Squamous cell carcinoma Histologic Grade: Poorly differentiated Stromal Invasion: Yes Depth of stromal invasion (millimeters): 10 mm Horizontal extent longitudinal/length (if applicable#) (millimeters): 25 mm Horizontal extent circumferential/width (if applicable#) (millimeters): 45 mm Other Tissue/ Organ: No Margins: Free of tumor Lymphovascular: Present Regional Lymph Nodes: Two positive for tumor cells  (select all that apply) Total Number of Lymph Nodes Examined: Twenty-one Number of Sentinel Nodes Examined (if applicable): 0 Pathologic Stage Classification (pTNM, AJCC 8th Edition): pT1b2, pN1 Ancillary Studies: N/A Representative Tumor Block: 3C, 3D, 3E, 52F, 3J and 3K Comment(s): The tumor is a poorly differentiated invasive squamous cell carcinoma which is up to 1.0 cm (10 mm) in thickness. There is lymphovascular space involvement within the wall of the cervix and in the paracervical connective tissue. The margins of the specimen are not involved by carcinoma (JDP:kh 04/29/18)    05/11/2018 Cancer Staging    Staging form: Cervix Uteri, AJCC 8th Edition - Pathologic stage from 05/11/2018: Stage III (pT3, pN1, cM0) - Signed by Heath Lark, MD on 05/11/2018    05/12/2018 Imaging    1. Soft tissue inflammation about the bladder could reflect cystitis. Would correlate with the patient's symptoms. 2. Small to moderate amount of free fluid within the pelvis, of uncertain significance. This may simply be postoperative in nature, though if urine output from the Foley catheter decreases, further evaluation could be considered to exclude urine leak.    05/29/2018 Procedure    Placement of a CT injectable subcutaneous port device.     REVIEW OF SYSTEMS:   Constitutional: Denies fevers, chills or abnormal weight loss Eyes: Denies blurriness of vision Ears, nose, mouth, throat, and face: Denies mucositis or sore throat Respiratory: Denies cough, dyspnea or wheezes Cardiovascular: Denies palpitation, chest discomfort  Gastrointestinal:  Denies nausea, heartburn or change in bowel habits Skin: Denies abnormal skin rashes Lymphatics: Denies new lymphadenopathy or easy bruising Neurological:Denies numbness, tingling or new weaknesses Behavioral/Psych: Mood is stable, no new changes  All other systems were reviewed with the patient and are negative.  I have reviewed the  past medical history, past  surgical history, social history and family history with the patient and they are unchanged from previous note.  ALLERGIES:  has No Known Allergies.  MEDICATIONS:  Current Outpatient Medications  Medication Sig Dispense Refill  . Glucosamine-Chondroitin (MOVE FREE PO) Take 1 tablet by mouth daily.     Marland Kitchen ibuprofen (ADVIL,MOTRIN) 600 MG tablet Take 1 tablet (600 mg total) by mouth every 6 (six) hours as needed for mild pain. 30 tablet 1  . lidocaine-prilocaine (EMLA) cream Apply to affected area once 30 g 3  . ondansetron (ZOFRAN) 8 MG tablet Take 1 tablet (8 mg total) by mouth every 8 (eight) hours as needed. Start on the third day after chemotherapy. 30 tablet 1  . prochlorperazine (COMPAZINE) 10 MG tablet Take 1 tablet (10 mg total) by mouth every 6 (six) hours as needed (Nausea or vomiting). 30 tablet 1  . senna-docusate (SENOKOT-S) 8.6-50 MG tablet Take 2 tablets by mouth at bedtime. Do not take if having diarrhea/loose stools 30 tablet 1   No current facility-administered medications for this visit.    Facility-Administered Medications Ordered in Other Visits  Medication Dose Route Frequency Provider Last Rate Last Dose  . heparin lock flush 100 unit/mL  500 Units Intracatheter Once Alvy Bimler, Benay Pomeroy, MD      . sodium chloride flush (NS) 0.9 % injection 10 mL  10 mL Intracatheter Once Heath Lark, MD        PHYSICAL EXAMINATION: ECOG PERFORMANCE STATUS: 1 - Symptomatic but completely ambulatory  Vitals:   06/10/18 1054  BP: 109/60  Pulse: 67  Resp: 18  Temp: 97.9 F (36.6 C)  SpO2: 100%   Filed Weights   06/10/18 1054  Weight: 143 lb 6.4 oz (65 kg)    GENERAL:alert, no distress and comfortable Musculoskeletal:no cyanosis of digits and no clubbing.  The port site looks okay.  Noted right leg swelling NEURO: alert & oriented x 3 with fluent speech, no focal motor/sensory deficits  LABORATORY DATA:  I have reviewed the data as listed    Component Value Date/Time   NA 142  06/10/2018 1027   K 4.3 06/10/2018 1027   CL 105 06/10/2018 1027   CO2 29 06/10/2018 1027   GLUCOSE 76 06/10/2018 1027   BUN 16 06/10/2018 1027   CREATININE 0.86 06/10/2018 1027   CALCIUM 9.7 06/10/2018 1027   PROT 7.2 06/10/2018 1027   ALBUMIN 3.9 06/10/2018 1027   AST 16 06/10/2018 1027   ALT 11 06/10/2018 1027   ALKPHOS 53 06/10/2018 1027   BILITOT 0.4 06/10/2018 1027   GFRNONAA >60 06/10/2018 1027   GFRAA >60 06/10/2018 1027    No results found for: SPEP, UPEP  Lab Results  Component Value Date   WBC 3.7 (L) 06/10/2018   NEUTROABS 1.8 06/10/2018   HGB 13.3 06/10/2018   HCT 40.5 06/10/2018   MCV 91.0 06/10/2018   PLT 180 06/10/2018      Chemistry      Component Value Date/Time   NA 142 06/10/2018 1027   K 4.3 06/10/2018 1027   CL 105 06/10/2018 1027   CO2 29 06/10/2018 1027   BUN 16 06/10/2018 1027   CREATININE 0.86 06/10/2018 1027      Component Value Date/Time   CALCIUM 9.7 06/10/2018 1027   ALKPHOS 53 06/10/2018 1027   AST 16 06/10/2018 1027   ALT 11 06/10/2018 1027   BILITOT 0.4 06/10/2018 1027       RADIOGRAPHIC STUDIES: I have personally  reviewed the radiological images as listed and agreed with the findings in the report. Ct Abdomen Pelvis W Contrast  Result Date: 05/12/2018 CLINICAL DATA:  Status post surgery for cervical cancer 2 days ago. Left lower quadrant abdominal pain and hematuria, acute onset. EXAM: CT ABDOMEN AND PELVIS WITH CONTRAST TECHNIQUE: Multidetector CT imaging of the abdomen and pelvis was performed using the standard protocol following bolus administration of intravenous contrast. CONTRAST:  131mL ISOVUE-300 IOPAMIDOL (ISOVUE-300) INJECTION 61% COMPARISON:  PET/CT performed 04/03/2018 FINDINGS: Lower chest: The visualized lung bases are grossly clear. The visualized portions of the mediastinum are unremarkable. Hepatobiliary: The liver is unremarkable in appearance. The gallbladder is unremarkable in appearance. The common bile  duct remains normal in caliber. Pancreas: The pancreas is within normal limits. Spleen: The spleen is unremarkable in appearance. Adrenals/Urinary Tract: The adrenal glands are unremarkable in appearance. The kidneys are within normal limits. There is no evidence of hydronephrosis. No renal or ureteral stones are identified. No perinephric stranding is seen. Stomach/Bowel: The stomach is unremarkable in appearance. The small bowel is within normal limits. The appendix is normal in caliber, without evidence of appendicitis. The colon is unremarkable in appearance. Vascular/Lymphatic: The abdominal aorta is unremarkable in appearance. The inferior vena cava is grossly unremarkable. No retroperitoneal lymphadenopathy is seen. No pelvic sidewall lymphadenopathy is identified. Reproductive: The bladder is decompressed, with a Foley catheter in place. Soft tissue inflammation about the bladder could reflect cystitis. Would correlate with the patient's symptoms. The patient is status post hysterectomy. No suspicious adnexal masses are seen. A small to moderate amount of free fluid is noted within the pelvis, of uncertain significance. Other: No additional soft tissue abnormalities are seen. Musculoskeletal: No acute osseous abnormalities are identified. The visualized musculature is unremarkable in appearance. IMPRESSION: 1. Soft tissue inflammation about the bladder could reflect cystitis. Would correlate with the patient's symptoms. 2. Small to moderate amount of free fluid within the pelvis, of uncertain significance. This may simply be postoperative in nature, though if urine output from the Foley catheter decreases, further evaluation could be considered to exclude urine leak. Electronically Signed   By: Garald Balding M.D.   On: 05/12/2018 22:29   Ir Imaging Guided Port Insertion  Result Date: 05/29/2018 INDICATION: 53 year old with cervical cancer.  Port-A-Cath needed for treatment. EXAM: FLUOROSCOPIC AND  ULTRASOUND GUIDED PLACEMENT OF A SUBCUTANEOUS PORT COMPARISON:  None. MEDICATIONS: Ancef 2 g; The antibiotic was administered within an appropriate time interval prior to skin puncture. ANESTHESIA/SEDATION: Versed 2.0 mg IV; Fentanyl 100 mcg IV; Moderate Sedation Time:  30 minutes The patient was continuously monitored during the procedure by the interventional radiology nurse under my direct supervision. FLUOROSCOPY TIME:  36 seconds, 2 mGy COMPLICATIONS: None immediate. PROCEDURE: The procedure, risks, benefits, and alternatives were explained to the patient. Questions regarding the procedure were encouraged and answered. The patient understands and consents to the procedure. Patient was placed supine on the interventional table. Ultrasound confirmed a patent right internal jugular vein. The right chest and neck were cleaned with a skin antiseptic and a sterile drape was placed. Maximal barrier sterile technique was utilized including caps, mask, sterile gowns, sterile gloves, sterile drape, hand hygiene and skin antiseptic. The right neck was anesthetized with 1% lidocaine. Small incision was made in the right neck with a blade. Micropuncture set was placed in the right internal jugular vein with ultrasound guidance. The micropuncture wire was used for measurement purposes. The right chest was anesthetized with 1% lidocaine with epinephrine. #15  blade was used to make an incision and a subcutaneous port pocket was formed. [place] was assembled. Subcutaneous tunnel was formed with a stiff tunneling device. The port catheter was brought through the subcutaneous tunnel. The port was placed in the subcutaneous pocket. The micropuncture set was exchanged for a peel-away sheath. The catheter was placed through the peel-away sheath and the tip was positioned at the SVC and right atrium junction. Catheter placement was confirmed with fluoroscopy. The port was accessed and flushed with heparinized saline. The  port pocket was closed using two layers of absorbable sutures and Dermabond. The vein skin site was closed using a single layer of absorbable suture and Dermabond. Sterile dressings were applied. Patient tolerated the procedure well without an immediate complication. Ultrasound and fluoroscopic images were taken and saved for this procedure. IMPRESSION: Placement of a CT injectable subcutaneous port device. Electronically Signed   By: Markus Daft M.D.   On: 05/29/2018 13:00    All questions were answered. The patient knows to call the clinic with any problems, questions or concerns. No barriers to learning was detected.  I spent 15 minutes counseling the patient face to face. The total time spent in the appointment was 20 minutes and more than 50% was on counseling and review of test results  Heath Lark, MD 06/10/2018 11:33 AM

## 2018-06-10 NOTE — Telephone Encounter (Signed)
Gave AVS and calendar °

## 2018-06-10 NOTE — Assessment & Plan Note (Signed)
We discussed the role of chemotherapy. The intent is of curative intent.  We discussed some of the risks, benefits, side-effects of cisplatin and its role as chemo sensitizing agent. The plan for weekly cisplatin for x5 doses along with radiation treatment.  Some of the short term side-effects included, though not limited to, including weight loss, life threatening infections, risk of allergic reactions, need for transfusions of blood products, nausea, vomiting, change in bowel habits, loss of hair, admission to hospital for various reasons, and risks of death.   Long term side-effects are also discussed including risks of infertility, permanent damage to nerve function, hearing loss, chronic fatigue, kidney damage with possibility needing hemodialysis, and rare secondary malignancy including bone marrow disorders.  The patient is aware that the response rates discussed earlier is not guaranteed.  After a long discussion, patient made an informed decision to proceed with the prescribed plan of care.

## 2018-06-11 ENCOUNTER — Ambulatory Visit: Payer: BLUE CROSS/BLUE SHIELD | Admitting: Hematology and Oncology

## 2018-06-11 ENCOUNTER — Ambulatory Visit
Admission: RE | Admit: 2018-06-11 | Discharge: 2018-06-11 | Disposition: A | Discharge status: Home / Self Care | Payer: BLUE CROSS/BLUE SHIELD | Source: Ambulatory Visit | Attending: Radiation Oncology | Admitting: Radiation Oncology

## 2018-06-11 DIAGNOSIS — C531 Malignant neoplasm of exocervix: Secondary | ICD-10-CM

## 2018-06-11 DIAGNOSIS — Z510 Encounter for antineoplastic radiation therapy: Secondary | ICD-10-CM | POA: Diagnosis not present

## 2018-06-11 NOTE — Progress Notes (Signed)
  Radiation Oncology         [phone] ________________________________  Name: Holly Pena MRN: [id number]  Date: 06/11/2018  DOB: 10-14-1965  Simulation Verification Note    ICD-10-CM   1. Malignant neoplasm of exocervix ([place]) C53.1     Status: outpatient  NARRATIVE: The patient was brought to the treatment unit and placed in the planned treatment position. The clinical setup was verified. Then port films were obtained and uploaded to the radiation oncology medical record software.  The treatment beams were carefully compared against the planned radiation fields. The position location and shape of the radiation fields was reviewed. They targeted volume of tissue appears to be appropriately covered by the radiation beams. Organs at risk appear to be excluded as planned.  Based on my personal review, I approved the simulation verification. The patient's treatment will proceed as planned.  -----------------------------------  Blair Promise, PhD, MD  This document serves as a record of services personally performed by Gery Pray, MD. It was created on his behalf by Rae Lips, a trained medical scribe. The creation of this record is based on the scribe's personal observations and the provider's statements to them. This document has been checked and approved by the attending provider.

## 2018-06-12 ENCOUNTER — Inpatient Hospital Stay: Payer: BLUE CROSS/BLUE SHIELD

## 2018-06-12 ENCOUNTER — Ambulatory Visit
Admission: RE | Admit: 2018-06-12 | Discharge: 2018-06-12 | Disposition: A | Discharge status: Home / Self Care | Payer: BLUE CROSS/BLUE SHIELD | Source: Ambulatory Visit | Attending: Radiation Oncology | Admitting: Radiation Oncology

## 2018-06-12 VITALS — BP 101/70 | HR 64 | Temp 98.0°F | Resp 18

## 2018-06-12 DIAGNOSIS — C531 Malignant neoplasm of exocervix: Secondary | ICD-10-CM

## 2018-06-12 DIAGNOSIS — Z510 Encounter for antineoplastic radiation therapy: Secondary | ICD-10-CM | POA: Diagnosis not present

## 2018-06-12 MED ORDER — PALONOSETRON HCL INJECTION 0.25 MG/5ML
INTRAVENOUS | Status: AC
  Filled 2018-06-12: qty 5

## 2018-06-12 MED ORDER — POTASSIUM CHLORIDE 2 MEQ/ML IV SOLN
Freq: Once | INTRAVENOUS | Status: AC
  Administered 2018-06-12: 09:00:00 via INTRAVENOUS
  Filled 2018-06-12: qty 10

## 2018-06-12 MED ORDER — SODIUM CHLORIDE 0.9 % IV SOLN
Freq: Once | INTRAVENOUS | Status: AC
  Administered 2018-06-12: 11:00:00 via INTRAVENOUS
  Filled 2018-06-12: qty 5

## 2018-06-12 MED ORDER — HEPARIN SOD (PORK) LOCK FLUSH 100 UNIT/ML IV SOLN
500.0000 [IU] | Freq: Once | INTRAVENOUS | Status: AC | PRN
  Administered 2018-06-12: 500 [IU]
  Filled 2018-06-12: qty 5

## 2018-06-12 MED ORDER — SODIUM CHLORIDE 0.9 % IV SOLN
40.0000 mg/m2 | Freq: Once | INTRAVENOUS | Status: AC
  Administered 2018-06-12: 67 mg via INTRAVENOUS
  Filled 2018-06-12: qty 67

## 2018-06-12 MED ORDER — SODIUM CHLORIDE 0.9 % IV SOLN
Freq: Once | INTRAVENOUS | Status: AC
  Administered 2018-06-12: 08:00:00 via INTRAVENOUS
  Filled 2018-06-12: qty 250

## 2018-06-12 MED ORDER — SODIUM CHLORIDE 0.9% FLUSH
10.0000 mL | INTRAVENOUS | Status: DC | PRN
  Administered 2018-06-12: 10 mL
  Filled 2018-06-12: qty 10

## 2018-06-12 MED ORDER — PALONOSETRON HCL INJECTION 0.25 MG/5ML
0.2500 mg | Freq: Once | INTRAVENOUS | Status: AC
  Administered 2018-06-12: 0.25 mg via INTRAVENOUS

## 2018-06-12 NOTE — Patient Instructions (Signed)
[facility] Discharge Instructions for Patients Receiving Chemotherapy  Today you received the following chemotherapy agents: cisplatin (Platinol).   To help prevent nausea and vomiting after your treatment, we encourage you to take your nausea medication as directed by your physician.   If you develop nausea and vomiting that is not controlled by your nausea medication, call the clinic.   BELOW ARE SYMPTOMS THAT SHOULD BE REPORTED IMMEDIATELY:  *FEVER GREATER THAN 100.5 F  *CHILLS WITH OR WITHOUT FEVER  NAUSEA AND VOMITING THAT IS NOT CONTROLLED WITH YOUR NAUSEA MEDICATION  *UNUSUAL SHORTNESS OF BREATH  *UNUSUAL BRUISING OR BLEEDING  TENDERNESS IN MOUTH AND THROAT WITH OR WITHOUT PRESENCE OF ULCERS  *URINARY PROBLEMS  *BOWEL PROBLEMS  UNUSUAL RASH Items with * indicate a potential emergency and should be followed up as soon as possible.  Feel free to call the clinic should you have any questions or concerns. The clinic phone number is [phone].  Please show the [employer] at check-in to the Emergency Department and triage nurse.  Cisplatin injection What is this medicine? CISPLATIN (SIS pla tin) is a chemotherapy drug. It targets fast dividing cells, like cancer cells, and causes these cells to die. This medicine is used to treat many types of cancer like bladder, ovarian, and testicular cancers. This medicine may be used for other purposes; ask your health care provider or pharmacist if you have questions. COMMON BRAND NAME(S): Platinol, Platinol -AQ What should I tell my health care provider before I take this medicine? They need to know if you have any of these conditions: -blood disorders -hearing problems -kidney disease -recent or ongoing radiation therapy -an unusual or allergic reaction to cisplatin, carboplatin, other chemotherapy, other medicines, foods, dyes, or preservatives -pregnant or trying to get pregnant -breast-feeding How  should I use this medicine? This drug is given as an infusion into a vein. It is administered in a hospital or clinic by a specially trained health care professional. Talk to your pediatrician regarding the use of this medicine in children. Special care may be needed. Overdosage: If you think you have taken too much of this medicine contact a poison control center or emergency room at once. NOTE: This medicine is only for you. Do not share this medicine with others. What if I miss a dose? It is important not to miss a dose. Call your doctor or health care professional if you are unable to keep an appointment. What may interact with this medicine? -dofetilide -foscarnet -medicines for seizures -medicines to increase blood counts like filgrastim, pegfilgrastim, sargramostim -probenecid -pyridoxine used with altretamine -rituximab -some antibiotics like amikacin, gentamicin, neomycin, polymyxin B, streptomycin, tobramycin -sulfinpyrazone -vaccines -zalcitabine Talk to your doctor or health care professional before taking any of these medicines: -acetaminophen -aspirin -ibuprofen -ketoprofen -naproxen This list may not describe all possible interactions. Give your health care provider a list of all the medicines, herbs, non-prescription drugs, or dietary supplements you use. Also tell them if you smoke, drink alcohol, or use illegal drugs. Some items may interact with your medicine. What should I watch for while using this medicine? Your condition will be monitored carefully while you are receiving this medicine. You will need important blood work done while you are taking this medicine. This drug may make you feel generally unwell. This is not uncommon, as chemotherapy can affect healthy cells as well as cancer cells. Report any side effects. Continue your course of treatment even though you feel ill unless your  doctor tells you to stop. In some cases, you may be given additional medicines  to help with side effects. Follow all directions for their use. Call your doctor or health care professional for advice if you get a fever, chills or sore throat, or other symptoms of a cold or flu. Do not treat yourself. This drug decreases your body's ability to fight infections. Try to avoid being around people who are sick. This medicine may increase your risk to bruise or bleed. Call your doctor or health care professional if you notice any unusual bleeding. Be careful brushing and flossing your teeth or using a toothpick because you may get an infection or bleed more easily. If you have any dental work done, tell your dentist you are receiving this medicine. Avoid taking products that contain aspirin, acetaminophen, ibuprofen, naproxen, or ketoprofen unless instructed by your doctor. These medicines may hide a fever. Do not become pregnant while taking this medicine. Women should inform their doctor if they wish to become pregnant or think they might be pregnant. There is a potential for serious side effects to an unborn child. Talk to your health care professional or pharmacist for more information. Do not breast-feed an infant while taking this medicine. Drink fluids as directed while you are taking this medicine. This will help protect your kidneys. Call your doctor or health care professional if you get diarrhea. Do not treat yourself. What side effects may I notice from receiving this medicine? Side effects that you should report to your doctor or health care professional as soon as possible: -allergic reactions like skin rash, itching or hives, swelling of the face, lips, or tongue -signs of infection - fever or chills, cough, sore throat, pain or difficulty passing urine -signs of decreased platelets or bleeding - bruising, pinpoint red spots on the skin, black, tarry stools, nosebleeds -signs of decreased red blood cells - unusually weak or tired, fainting spells,  lightheadedness -breathing problems -changes in hearing -gout pain -low blood counts - This drug may decrease the number of white blood cells, red blood cells and platelets. You may be at increased risk for infections and bleeding. -nausea and vomiting -pain, swelling, redness or irritation at the injection site -pain, tingling, numbness in the hands or feet -problems with balance, movement -trouble passing urine or change in the amount of urine Side effects that usually do not require medical attention (report to your doctor or health care professional if they continue or are bothersome): -changes in vision -loss of appetite -metallic taste in the mouth or changes in taste This list may not describe all possible side effects. Call your doctor for medical advice about side effects. You may report side effects to FDA at 1-800-FDA-1088. Where should I keep my medicine? This drug is given in a hospital or clinic and will not be stored at home. NOTE: This sheet is a summary. It may not cover all possible information. If you have questions about this medicine, talk to your doctor, pharmacist, or health care provider.  2019 Elsevier/Gold Standard (2007-08-04 14:40:54)

## 2018-06-15 ENCOUNTER — Ambulatory Visit
Admission: RE | Admit: 2018-06-15 | Discharge: 2018-06-15 | Disposition: A | Discharge status: Home / Self Care | Payer: BLUE CROSS/BLUE SHIELD | Source: Ambulatory Visit | Attending: Radiation Oncology | Admitting: Radiation Oncology

## 2018-06-15 DIAGNOSIS — Z510 Encounter for antineoplastic radiation therapy: Secondary | ICD-10-CM | POA: Insufficient documentation

## 2018-06-15 DIAGNOSIS — C531 Malignant neoplasm of exocervix: Secondary | ICD-10-CM | POA: Diagnosis not present

## 2018-06-16 ENCOUNTER — Ambulatory Visit
Admission: RE | Admit: 2018-06-16 | Discharge: 2018-06-16 | Disposition: A | Discharge status: Home / Self Care | Payer: BLUE CROSS/BLUE SHIELD | Source: Ambulatory Visit | Attending: Radiation Oncology | Admitting: Radiation Oncology

## 2018-06-16 DIAGNOSIS — Z510 Encounter for antineoplastic radiation therapy: Secondary | ICD-10-CM | POA: Diagnosis not present

## 2018-06-17 ENCOUNTER — Inpatient Hospital Stay: Payer: BLUE CROSS/BLUE SHIELD

## 2018-06-17 ENCOUNTER — Ambulatory Visit: Admission: RE | Admit: 2018-06-17 | Payer: BLUE CROSS/BLUE SHIELD | Source: Ambulatory Visit

## 2018-06-17 ENCOUNTER — Ambulatory Visit: Payer: BLUE CROSS/BLUE SHIELD

## 2018-06-17 DIAGNOSIS — R634 Abnormal weight loss: Secondary | ICD-10-CM | POA: Insufficient documentation

## 2018-06-17 DIAGNOSIS — D72818 Other decreased white blood cell count: Secondary | ICD-10-CM | POA: Insufficient documentation

## 2018-06-17 DIAGNOSIS — C531 Malignant neoplasm of exocervix: Secondary | ICD-10-CM | POA: Insufficient documentation

## 2018-06-17 DIAGNOSIS — Z5111 Encounter for antineoplastic chemotherapy: Secondary | ICD-10-CM | POA: Insufficient documentation

## 2018-06-17 DIAGNOSIS — Z90710 Acquired absence of both cervix and uterus: Secondary | ICD-10-CM | POA: Insufficient documentation

## 2018-06-17 DIAGNOSIS — K5909 Other constipation: Secondary | ICD-10-CM | POA: Insufficient documentation

## 2018-06-17 DIAGNOSIS — R110 Nausea: Secondary | ICD-10-CM | POA: Insufficient documentation

## 2018-06-17 DIAGNOSIS — Z90722 Acquired absence of ovaries, bilateral: Secondary | ICD-10-CM | POA: Insufficient documentation

## 2018-06-17 DIAGNOSIS — R339 Retention of urine, unspecified: Secondary | ICD-10-CM

## 2018-06-17 DIAGNOSIS — N390 Urinary tract infection, site not specified: Secondary | ICD-10-CM

## 2018-06-17 DIAGNOSIS — B952 Enterococcus as the cause of diseases classified elsewhere: Secondary | ICD-10-CM | POA: Insufficient documentation

## 2018-06-17 DIAGNOSIS — K649 Unspecified hemorrhoids: Secondary | ICD-10-CM | POA: Insufficient documentation

## 2018-06-17 DIAGNOSIS — Z510 Encounter for antineoplastic radiation therapy: Secondary | ICD-10-CM | POA: Diagnosis not present

## 2018-06-17 DIAGNOSIS — D61818 Other pancytopenia: Secondary | ICD-10-CM

## 2018-06-17 LAB — COMPREHENSIVE METABOLIC PANEL
ALT: 23 U/L (ref 0–44)
ANION GAP: 7 (ref 5–15)
AST: 20 U/L (ref 15–41)
Albumin: 3.9 g/dL (ref 3.5–5.0)
Alkaline Phosphatase: 52 U/L (ref 38–126)
BUN: 11 mg/dL (ref 6–20)
CO2: 28 mmol/L (ref 22–32)
Calcium: 9.4 mg/dL (ref 8.9–10.3)
Chloride: 105 mmol/L (ref 98–111)
Creatinine, Ser: 0.77 mg/dL (ref 0.44–1.00)
GFR calc non Af Amer: >60 mL/min (ref >60)
Glucose, Bld: 103 mg/dL — ABNORMAL HIGH (ref 70–99)
POTASSIUM: 3.9 mmol/L (ref 3.5–5.1)
Sodium: 140 mmol/L (ref 135–145)
Total Bilirubin: 0.5 mg/dL (ref 0.3–1.2)
Total Protein: 6.8 g/dL (ref 6.5–8.1)

## 2018-06-17 LAB — CBC WITH DIFFERENTIAL/PLATELET
Abs Immature Granulocytes: 0.01 10*3/uL (ref 0.00–0.07)
BASOS ABS: 0 10*3/uL (ref 0.0–0.1)
Basophils Relative: 1 %
Eosinophils Absolute: 0.1 10*3/uL (ref 0.0–0.5)
Eosinophils Relative: 4 %
HCT: 37.4 % (ref 36.0–46.0)
Hemoglobin: 12.2 g/dL (ref 12.0–15.0)
Immature Granulocytes: 0 %
Lymphocytes Relative: 15 %
Lymphs Abs: 0.4 10*3/uL — ABNORMAL LOW (ref 0.7–4.0)
MCH: 29.4 pg (ref 26.0–34.0)
MCHC: 32.6 g/dL (ref 30.0–36.0)
MCV: 90.1 fL (ref 80.0–100.0)
Monocytes Absolute: 0.2 10*3/uL (ref 0.1–1.0)
Monocytes Relative: 8 %
NEUTROS ABS: 2.1 10*3/uL (ref 1.7–7.7)
NRBC: 0 % (ref 0.0–0.2)
Neutrophils Relative %: 72 %
Platelets: 174 10*3/uL (ref 150–400)
RBC: 4.15 MIL/uL (ref 3.87–5.11)
RDW: 12.1 % (ref 11.5–15.5)
WBC: 2.9 10*3/uL — ABNORMAL LOW (ref 4.0–10.5)

## 2018-06-17 LAB — MAGNESIUM: Magnesium: 1.8 mg/dL (ref 1.7–2.4)

## 2018-06-18 ENCOUNTER — Encounter: Payer: Self-pay | Admitting: Oncology

## 2018-06-18 ENCOUNTER — Ambulatory Visit
Admission: RE | Admit: 2018-06-18 | Discharge: 2018-06-18 | Disposition: A | Discharge status: Home / Self Care | Payer: BLUE CROSS/BLUE SHIELD | Source: Ambulatory Visit | Attending: Radiation Oncology | Admitting: Radiation Oncology

## 2018-06-18 ENCOUNTER — Encounter: Payer: Self-pay | Admitting: Hematology and Oncology

## 2018-06-18 ENCOUNTER — Inpatient Hospital Stay (HOSPITAL_BASED_OUTPATIENT_CLINIC_OR_DEPARTMENT_OTHER): Payer: BLUE CROSS/BLUE SHIELD | Admitting: Hematology and Oncology

## 2018-06-18 VITALS — BP 101/71 | HR 63 | Temp 98.3°F | Resp 18 | Ht 63.0 in | Wt 141.6 lb

## 2018-06-18 DIAGNOSIS — R339 Retention of urine, unspecified: Secondary | ICD-10-CM | POA: Insufficient documentation

## 2018-06-18 DIAGNOSIS — R110 Nausea: Secondary | ICD-10-CM

## 2018-06-18 DIAGNOSIS — D72819 Decreased white blood cell count, unspecified: Secondary | ICD-10-CM

## 2018-06-18 DIAGNOSIS — C531 Malignant neoplasm of exocervix: Secondary | ICD-10-CM | POA: Diagnosis not present

## 2018-06-18 DIAGNOSIS — R634 Abnormal weight loss: Secondary | ICD-10-CM

## 2018-06-18 DIAGNOSIS — Z510 Encounter for antineoplastic radiation therapy: Secondary | ICD-10-CM | POA: Diagnosis not present

## 2018-06-18 DIAGNOSIS — K5909 Other constipation: Secondary | ICD-10-CM

## 2018-06-18 MED ORDER — DEXAMETHASONE 4 MG PO TABS: 4.0000 mg | ORAL_TABLET | Freq: Two times a day (BID) | ORAL | 0 refills | Status: DC

## 2018-06-18 NOTE — Assessment & Plan Note (Signed)
She has mild worsening constipation that could contribute to nausea and urinary retention We discussed laxative therapy

## 2018-06-18 NOTE — Assessment & Plan Note (Signed)
She has sensation of incomplete bladder emptying I will see if we can get her a kit for self-catheterization It is likely that some of the medications and chemotherapy might have caused mild worsening urinary retention

## 2018-06-18 NOTE — Progress Notes (Signed)
[facility] OFFICE PROGRESS NOTE  Patient Care Team: Lucretia Kern, DO as PCP - General (Family Medicine)  ASSESSMENT & PLAN:  Cervical cancer [facility]) She tolerated treatment poorly with nausea, altered taste sensation, constipation, leukopenia and mild weight loss I plan to reduce the dose of chemotherapy a little bit I will continue to see her every week for supportive care  Urinary retention with incomplete bladder emptying She has sensation of incomplete bladder emptying I will see if we can get her a kit for self-catheterization It is likely that some of the medications and chemotherapy might have caused mild worsening urinary retention  Leukopenia She has chronic leukopenia, slightly worse due to side effects of treatment I plan to reduce the dose of chemotherapy by 20%  Other constipation She has mild worsening constipation that could contribute to nausea and urinary retention We discussed laxative therapy  Nausea without vomiting She had early onset nausea from treatment I recommend daily dexamethasone twice daily for at least 3 days after each dose of treatment, along with antiemetics as needed   No orders of the defined types were placed in this encounter.   INTERVAL HISTORY: Please see below for problem oriented charting. She is seen with [place] interpreter and her husband Since chemotherapy last week, she had multiple side effects She had altered taste sensation, nausea, weight loss, mild worsening leukopenia, constipation and urinary retention. She was not able to complete her radiation treatment due to profound urinary retention Denies peripheral neuropathy No recent infection, fever or chills Despite nausea, she had no vomiting. The nausea started immediately today after chemotherapy and lasted about 3 days  SUMMARY OF ONCOLOGIC HISTORY:   Cervical cancer ([place])   02/20/2018 Initial Diagnosis    She presented with postmenopausal bleeding     03/10/2018 Pathology Results    Endometrium, biopsy - SQUAMOUS CELL CARCINOMA. - SEE COMMENT.    03/17/2018 Imaging    US pelvis: Endometrium measures 7 mm. In the setting of post-menopausal bleeding, endometrial sampling is indicated to exclude carcinoma    03/24/2018 Procedure    Pre-operative Diagnosis:  Suspect SCCa Cervix based on endometrial biopsy  Post-operative Diagnosis:  Stage IB1 (FIGO 2018) SCCa Cervix  Operation:  Exam under anesthesia Cervical biopsies  Operative Findings:  Anterior cervical lip with gross lesion from ~10-12:00. Remainder of cervix grossly normal. Estimate lesion to be <2cm. No vaginal or parametrial involvement.    03/30/2018 Imaging    CT scan of chest, abdomen and pelvis: 1. Isolated low-density structure lateral to the descending colon. Differential considerations include isolated peritoneal implant/metastasis, GI duplication/mesenteric cyst, or postoperative collection such as seroma (clinical history only describes prior Caesarean section). Consider further evaluation with PET. 2. Otherwise, no evidence of metastatic disease in the chest, abdomen, or pelvis.    04/04/2018 PET scan    1. Prominent hypermetabolism within the cervix, consistent with known cervical cancer. No parametrial extension of tumor or abdominopelvic nodal metastatic disease. 2. The previously demonstrated low-density structure posterior to the descending colon demonstrates no hypermetabolic activity and is likely an incidental duplication/mesenteric cyst. 3. Focal hypermetabolic activity within a single right neck lymph node (level 1B). This is unlikely to be related to the patient's cervical cancer. No hypermetabolism is seen within the pharyngeal mucosal space. ENT evaluation should be considered, especially if the patient has risk factors for head and neck malignancy.    04/28/2018 Surgery    Surgeon: Donaciano Eva  Pre-operative Diagnosis: stage IB2 cervical  cancer  Post-operative Diagnosis: same  Operation: Robotic-assisted type III radical laparoscopic hysterectomy with bilateral salpingectomy and bilateral pelvic lymphadenectomy  Surgeon: Donaciano Eva  Operative Findings:  : 3.5cm tumor replacing the anterior and right cervix. No gross parametrial involvement.         Specimens: left and right pelvic nodes, uterus with cervix, bilateral tubes and upper vagina. Anterior vaginal margin with marking stitch at 12 o'clock of new true distal anterior vaginal margin         Complications:  None; patient tolerated the procedure well.             04/28/2018 Pathology Results    1. Lymph nodes, regional resection, right pelvic - EIGHT BENIGN LYMPH NODES (0/8). 2. Lymph nodes, regional resection, left pelvic - SEVEN BENIGN LYMPH NODES (0/7). 3. Uterus, cervix and bilateral fallopian tubes, upper vagina CERVIX: - INVASIVE POORLY DIFFERENTIATED SQUAMOUS CELL CARCINOMA, 4.5 X 2.6 X 1.0 CM. - MARGINS NOT INVOLVED. - LYMPHOVASCULAR INVOLVEMENT BY TUMOR. - METASTATIC CARCINOMA IN TWO OF SIX PARACERVICAL LYMPH NODES (2/6). BENIGN ENDOMETRIUM AND MYOMETRIUM SEE ONCOLOGY TABLE. 4. Vagina, biopsy, anterior vaginal margin - SQUAMOUS MUCOSA WITH SLIGHT INFLAMMATION. - NO EVIDENCE OF INVASIVE CARCINOMA. Microscopic Comment 3. UTERINE CERVIX: Resection  Procedure: Hysterectomy with right and left pelvic regional lymph nodes and anterior vaginal margin. Tumor Size: 4.5 x 2.6 x 1.0 cm. Histologic Type: Squamous cell carcinoma Histologic Grade: Poorly differentiated Stromal Invasion: Yes Depth of stromal invasion (millimeters): 10 mm Horizontal extent longitudinal/length (if applicable#) (millimeters): 25 mm Horizontal extent circumferential/width (if applicable#) (millimeters): 45 mm Other Tissue/ Organ: No Margins: Free of tumor Lymphovascular: Present Regional Lymph Nodes: Two positive for tumor cells (select all that apply) Total  Number of Lymph Nodes Examined: Twenty-one Number of Sentinel Nodes Examined (if applicable): 0 Pathologic Stage Classification (pTNM, AJCC 8th Edition): pT1b2, pN1 Ancillary Studies: N/A Representative Tumor Block: 3C, 3D, 3E, 107F, 3J and 3K Comment(s): The tumor is a poorly differentiated invasive squamous cell carcinoma which is up to 1.0 cm (10 mm) in thickness. There is lymphovascular space involvement within the wall of the cervix and in the paracervical connective tissue. The margins of the specimen are not involved by carcinoma (JDP:kh 04/29/18)    05/11/2018 Cancer Staging    Staging form: Cervix Uteri, AJCC 8th Edition - Pathologic stage from 05/11/2018: Stage III (pT3, pN1, cM0) - Signed by Heath Lark, MD on 05/11/2018    05/12/2018 Imaging    1. Soft tissue inflammation about the bladder could reflect cystitis. Would correlate with the patient's symptoms. 2. Small to moderate amount of free fluid within the pelvis, of uncertain significance. This may simply be postoperative in nature, though if urine output from the Foley catheter decreases, further evaluation could be considered to exclude urine leak.    05/29/2018 Procedure    Placement of a CT injectable subcutaneous port device.    06/12/2018 -  Chemotherapy    She received weekly cisplatin with radiation     REVIEW OF SYSTEMS:   Constitutional: Denies fevers, chills or abnormal weight loss Eyes: Denies blurriness of vision Ears, nose, mouth, throat, and face: Denies mucositis or sore throat Respiratory: Denies cough, dyspnea or wheezes Cardiovascular: Denies palpitation, chest discomfort or lower extremity swelling Skin: Denies abnormal skin rashes Lymphatics: Denies new lymphadenopathy or easy bruising Neurological:Denies numbness, tingling or new weaknesses Behavioral/Psych: Mood is stable, no new changes  All other systems were reviewed with the patient and are negative.  I have reviewed  the past medical history,  past surgical history, social history and family history with the patient and they are unchanged from previous note.  ALLERGIES:  has No Known Allergies.  MEDICATIONS:  Current Outpatient Medications  Medication Sig Dispense Refill  . dexamethasone (DECADRON) 4 MG tablet Take 1 tablet (4 mg total) by mouth 2 (two) times daily. 60 tablet 0  . Glucosamine-Chondroitin (MOVE FREE PO) Take 1 tablet by mouth daily.     Marland Kitchen ibuprofen (ADVIL,MOTRIN) 600 MG tablet Take 1 tablet (600 mg total) by mouth every 6 (six) hours as needed for mild pain. 30 tablet 1  . lidocaine-prilocaine (EMLA) cream Apply to affected area once 30 g 3  . ondansetron (ZOFRAN) 8 MG tablet Take 1 tablet (8 mg total) by mouth every 8 (eight) hours as needed. Start on the third day after chemotherapy. 30 tablet 1  . prochlorperazine (COMPAZINE) 10 MG tablet Take 1 tablet (10 mg total) by mouth every 6 (six) hours as needed (Nausea or vomiting). 30 tablet 1  . senna-docusate (SENOKOT-S) 8.6-50 MG tablet Take 2 tablets by mouth at bedtime. Do not take if having diarrhea/loose stools 30 tablet 1   No current facility-administered medications for this visit.    Facility-Administered Medications Ordered in Other Visits  Medication Dose Route Frequency Provider Last Rate Last Dose  . heparin lock flush 100 unit/mL  500 Units Intracatheter Once Rosabelle Jupin, MD      . sodium chloride flush (NS) 0.9 % injection 10 mL  10 mL Intracatheter Once Alvy Bimler, Yanett Conkright, MD        PHYSICAL EXAMINATION: ECOG PERFORMANCE STATUS: 1 - Symptomatic but completely ambulatory  Vitals:   06/18/18 0909  BP: 101/71  Pulse: 63  Resp: 18  Temp: 98.3 F (36.8 C)  SpO2: 100%   Filed Weights   06/18/18 0909  Weight: 141 lb 9.6 oz (64.2 kg)    GENERAL:alert, no distress and comfortable SKIN: skin color, texture, turgor are normal, no rashes or significant lesions EYES: normal, Conjunctiva are pink and non-injected, sclera clear OROPHARYNX:no exudate, no  erythema and lips, buccal mucosa, and tongue normal  NECK: supple, thyroid normal size, non-tender, without nodularity LYMPH:  no palpable lymphadenopathy in the cervical, axillary or inguinal LUNGS: clear to auscultation and percussion with normal breathing effort HEART: regular rate & rhythm and no murmurs and no lower extremity edema ABDOMEN:abdomen soft, non-tender and normal bowel sounds Musculoskeletal:no cyanosis of digits and no clubbing  NEURO: alert & oriented x 3 with fluent speech, no focal motor/sensory deficits  LABORATORY DATA:  I have reviewed the data as listed    Component Value Date/Time   NA 140 06/17/2018 1036   K 3.9 06/17/2018 1036   CL 105 06/17/2018 1036   CO2 28 06/17/2018 1036   GLUCOSE 103 (H) 06/17/2018 1036   BUN 11 06/17/2018 1036   CREATININE 0.77 06/17/2018 1036   CALCIUM 9.4 06/17/2018 1036   PROT 6.8 06/17/2018 1036   ALBUMIN 3.9 06/17/2018 1036   AST 20 06/17/2018 1036   ALT 23 06/17/2018 1036   ALKPHOS 52 06/17/2018 1036   BILITOT 0.5 06/17/2018 1036   GFRNONAA >60 06/17/2018 1036   GFRAA >60 06/17/2018 1036    No results found for: SPEP, UPEP  Lab Results  Component Value Date   WBC 2.9 (L) 06/17/2018   NEUTROABS 2.1 06/17/2018   HGB 12.2 06/17/2018   HCT 37.4 06/17/2018   MCV 90.1 06/17/2018   PLT 174 06/17/2018  Chemistry      Component Value Date/Time   NA 140 06/17/2018 1036   K 3.9 06/17/2018 1036   CL 105 06/17/2018 1036   CO2 28 06/17/2018 1036   BUN 11 06/17/2018 1036   CREATININE 0.77 06/17/2018 1036      Component Value Date/Time   CALCIUM 9.4 06/17/2018 1036   ALKPHOS 52 06/17/2018 1036   AST 20 06/17/2018 1036   ALT 23 06/17/2018 1036   BILITOT 0.5 06/17/2018 1036       RADIOGRAPHIC STUDIES: I have personally reviewed the radiological images as listed and agreed with the findings in the report. Ir Imaging Guided Port Insertion  Result Date: 05/29/2018 INDICATION: 53 year old with cervical cancer.   Port-A-Cath needed for treatment. EXAM: FLUOROSCOPIC AND ULTRASOUND GUIDED PLACEMENT OF A SUBCUTANEOUS PORT COMPARISON:  None. MEDICATIONS: Ancef 2 g; The antibiotic was administered within an appropriate time interval prior to skin puncture. ANESTHESIA/SEDATION: Versed 2.0 mg IV; Fentanyl 100 mcg IV; Moderate Sedation Time:  30 minutes The patient was continuously monitored during the procedure by the interventional radiology nurse under my direct supervision. FLUOROSCOPY TIME:  36 seconds, 2 mGy COMPLICATIONS: None immediate. PROCEDURE: The procedure, risks, benefits, and alternatives were explained to the patient. Questions regarding the procedure were encouraged and answered. The patient understands and consents to the procedure. Patient was placed supine on the interventional table. Ultrasound confirmed a patent right internal jugular vein. The right chest and neck were cleaned with a skin antiseptic and a sterile drape was placed. Maximal barrier sterile technique was utilized including caps, mask, sterile gowns, sterile gloves, sterile drape, hand hygiene and skin antiseptic. The right neck was anesthetized with 1% lidocaine. Small incision was made in the right neck with a blade. Micropuncture set was placed in the right internal jugular vein with ultrasound guidance. The micropuncture wire was used for measurement purposes. The right chest was anesthetized with 1% lidocaine with epinephrine. #15 blade was used to make an incision and a subcutaneous port pocket was formed. [place] was assembled. Subcutaneous tunnel was formed with a stiff tunneling device. The port catheter was brought through the subcutaneous tunnel. The port was placed in the subcutaneous pocket. The micropuncture set was exchanged for a peel-away sheath. The catheter was placed through the peel-away sheath and the tip was positioned at the SVC and right atrium junction. Catheter placement was confirmed with fluoroscopy. The  port was accessed and flushed with heparinized saline. The port pocket was closed using two layers of absorbable sutures and Dermabond. The vein skin site was closed using a single layer of absorbable suture and Dermabond. Sterile dressings were applied. Patient tolerated the procedure well without an immediate complication. Ultrasound and fluoroscopic images were taken and saved for this procedure. IMPRESSION: Placement of a CT injectable subcutaneous port device. Electronically Signed   By: Markus Daft M.D.   On: 05/29/2018 13:00    All questions were answered. The patient knows to call the clinic with any problems, questions or concerns. No barriers to learning was detected.  I spent 30 minutes counseling the patient face to face. The total time spent in the appointment was 40 minutes and more than 50% was on counseling and review of test results  Heath Lark, MD 06/18/2018 9:38 AM

## 2018-06-18 NOTE — Assessment & Plan Note (Signed)
She has chronic leukopenia, slightly worse due to side effects of treatment I plan to reduce the dose of chemotherapy by 20%

## 2018-06-18 NOTE — Assessment & Plan Note (Signed)
She tolerated treatment poorly with nausea, altered taste sensation, constipation, leukopenia and mild weight loss I plan to reduce the dose of chemotherapy a little bit I will continue to see her every week for supportive care

## 2018-06-18 NOTE — Assessment & Plan Note (Signed)
She had early onset nausea from treatment I recommend daily dexamethasone twice daily for at least 3 days after each dose of treatment, along with antiemetics as needed

## 2018-06-19 ENCOUNTER — Ambulatory Visit
Admission: RE | Admit: 2018-06-19 | Discharge: 2018-06-19 | Disposition: A | Discharge status: Home / Self Care | Payer: BLUE CROSS/BLUE SHIELD | Source: Ambulatory Visit | Attending: Radiation Oncology | Admitting: Radiation Oncology

## 2018-06-19 ENCOUNTER — Inpatient Hospital Stay: Payer: BLUE CROSS/BLUE SHIELD

## 2018-06-19 ENCOUNTER — Inpatient Hospital Stay (HOSPITAL_BASED_OUTPATIENT_CLINIC_OR_DEPARTMENT_OTHER): Payer: BLUE CROSS/BLUE SHIELD | Admitting: Medical

## 2018-06-19 VITALS — BP 122/74 | HR 75 | Temp 98.4°F | Resp 16

## 2018-06-19 DIAGNOSIS — C531 Malignant neoplasm of exocervix: Secondary | ICD-10-CM

## 2018-06-19 DIAGNOSIS — Z95828 Presence of other vascular implants and grafts: Secondary | ICD-10-CM

## 2018-06-19 DIAGNOSIS — Z510 Encounter for antineoplastic radiation therapy: Secondary | ICD-10-CM | POA: Diagnosis not present

## 2018-06-19 MED ORDER — SODIUM CHLORIDE 0.9 % IV SOLN
32.0000 mg/m2 | Freq: Once | INTRAVENOUS | Status: AC
  Administered 2018-06-19: 54 mg via INTRAVENOUS
  Filled 2018-06-19: qty 54

## 2018-06-19 MED ORDER — POTASSIUM CHLORIDE 2 MEQ/ML IV SOLN
Freq: Once | INTRAVENOUS | Status: AC
  Administered 2018-06-19: 09:00:00 via INTRAVENOUS
  Filled 2018-06-19: qty 10

## 2018-06-19 MED ORDER — HEPARIN SOD (PORK) LOCK FLUSH 100 UNIT/ML IV SOLN
500.0000 [IU] | Freq: Once | INTRAVENOUS | Status: AC | PRN
  Administered 2018-06-19: 500 [IU]
  Filled 2018-06-19: qty 5

## 2018-06-19 MED ORDER — SODIUM CHLORIDE 0.9 % IV SOLN
Freq: Once | INTRAVENOUS | Status: AC
  Administered 2018-06-19: 11:00:00 via INTRAVENOUS
  Filled 2018-06-19: qty 5

## 2018-06-19 MED ORDER — PALONOSETRON HCL INJECTION 0.25 MG/5ML
INTRAVENOUS | Status: AC
  Filled 2018-06-19: qty 5

## 2018-06-19 MED ORDER — SODIUM CHLORIDE 0.9 % IV SOLN
Freq: Once | INTRAVENOUS | Status: AC
  Administered 2018-06-19: 09:00:00 via INTRAVENOUS
  Filled 2018-06-19: qty 250

## 2018-06-19 MED ORDER — PALONOSETRON HCL INJECTION 0.25 MG/5ML
0.2500 mg | Freq: Once | INTRAVENOUS | Status: AC
  Administered 2018-06-19: 0.25 mg via INTRAVENOUS

## 2018-06-19 MED ORDER — SODIUM CHLORIDE 0.9% FLUSH
10.0000 mL | INTRAVENOUS | Status: DC | PRN
  Administered 2018-06-19: 10 mL
  Filled 2018-06-19: qty 10

## 2018-06-19 NOTE — Progress Notes (Signed)
About 10 min into pt's Cisplatin infusion, she began c/o of burning at her [facility] site. Infusion paused and site assessed. Area was uniform in color, with no redness present, and pt denied any tenderness on palpation. Chemo line clamped and PAC flushed at other access point. Blood return noted immediately and pt denied any discomfort when flushing. [place] Client notified and came to treatment area to assess pt. Received an OK from [address] to resume treatment. Instructed pt to notify staff should symptoms return or new ones arise. Pt verbalized understanding and agreement.

## 2018-06-19 NOTE — Progress Notes (Signed)
The patient is a 53 year old female with a history of cervical cancer who is managed by Dr. Heath Lark.  The patient was receiving cycle 2 of cisplatin and today when she reported to her nurse that she had some stinging around her port.  The cis-platinum was paused.  Blood was aspirated and IV fluid was administered.  The patient's port was patent.  The patient was seen.  There was no effusion, induration, erythema, or decreased skin integrity at the site of her port.  Based on the fact that the patient's port had blood return and that she was able to have IV fluid administered, instructions were given to restart her cisplatin.  This was done with no issues of concern.  The patient was able to complete her infusion of cisplatin.  Sandi Mealy, MHS, PA-C Physician Assistant

## 2018-06-20 NOTE — Progress Notes (Addendum)
  Radiation Oncology         [phone] ________________________________  Name: Holly Pena MRN: [id number]  Date: 06/19/2018  DOB: 05-18-1965  SIMULATION AND TREATMENT PLANNING NOTE - re-simulation    ICD-10-CM   1. Malignant neoplasm of exocervix ([place]) C53.1     DIAGNOSIS:  : Stage III-C(pT1b2, pN1)poorly differentiated squamous cell carcinoma of the cervix   NARRATIVE:  The patient was brought to the [facility].  Identity was confirmed.  All relevant records and images related to the planned course of therapy were reviewed.  The patient freely provided informed written consent to proceed with treatment after reviewing the details related to the planned course of therapy. The consent form was witnessed and verified by the simulation staff.  Then, the patient was set-up in a stable reproducible  supine position for radiation therapy.  CT images were obtained.  Surface markings were placed.  The CT images were loaded into the planning software.  Then the target and avoidance structures were contoured.  Treatment planning then occurred.  The radiation prescription was entered and confirmed.  Then, I designed and supervised the construction of a total of 2 medically necessary complex treatment devices.  I have requested : Intensity Modulated Radiotherapy (IMRT) is medically necessary for this case for the following reason:  Small bowel sparing..  I have ordered:dose calc.  PLAN:  The patient will receive 45 Gy in 25 fractions total dose when  summing her initial pelvic radiation fields with the current plan fields.  Patient underwent re-simulation today. On her initial cone beam images the patient's bladder volume was significantly larger than bladder volume on her initial planning CT scan. Despite patient attempting to empty her bladder prior to each radiation treatment her bladder volume continue to be significantly enlarged. This did require re-planning in light of the significant  change in patient's pelvic anatomy. The patient has temporarily lost the sensation of when her bladder is becoming full after her surgery. Hopefully this will improve with time.  Special Treatment Procedure Note: The patient will be receiving radiosensitizing chemotherapy. Given the potential of increased toxicities related to combined therapy and the necessity for close monitoring of the patient and blood work, this constitutes a special treatment procedure.  -----------------------------------  Blair Promise, PhD, MD

## 2018-06-22 ENCOUNTER — Ambulatory Visit
Admission: RE | Admit: 2018-06-22 | Discharge: 2018-06-22 | Disposition: A | Discharge status: Home / Self Care | Payer: BLUE CROSS/BLUE SHIELD | Source: Ambulatory Visit | Attending: Radiation Oncology | Admitting: Radiation Oncology

## 2018-06-22 DIAGNOSIS — Z510 Encounter for antineoplastic radiation therapy: Secondary | ICD-10-CM | POA: Diagnosis not present

## 2018-06-23 ENCOUNTER — Ambulatory Visit
Admission: RE | Admit: 2018-06-23 | Discharge: 2018-06-23 | Disposition: A | Discharge status: Home / Self Care | Payer: BLUE CROSS/BLUE SHIELD | Source: Ambulatory Visit | Attending: Radiation Oncology | Admitting: Radiation Oncology

## 2018-06-23 DIAGNOSIS — Z510 Encounter for antineoplastic radiation therapy: Secondary | ICD-10-CM | POA: Diagnosis not present

## 2018-06-24 ENCOUNTER — Ambulatory Visit: Payer: BLUE CROSS/BLUE SHIELD

## 2018-06-24 ENCOUNTER — Ambulatory Visit
Admission: RE | Admit: 2018-06-24 | Discharge: 2018-06-24 | Disposition: A | Discharge status: Home / Self Care | Payer: BLUE CROSS/BLUE SHIELD | Source: Ambulatory Visit | Attending: Radiation Oncology | Admitting: Radiation Oncology

## 2018-06-24 ENCOUNTER — Inpatient Hospital Stay: Payer: BLUE CROSS/BLUE SHIELD

## 2018-06-24 DIAGNOSIS — R300 Dysuria: Secondary | ICD-10-CM

## 2018-06-24 DIAGNOSIS — C531 Malignant neoplasm of exocervix: Secondary | ICD-10-CM

## 2018-06-24 DIAGNOSIS — Z510 Encounter for antineoplastic radiation therapy: Secondary | ICD-10-CM | POA: Diagnosis not present

## 2018-06-24 LAB — CBC WITH DIFFERENTIAL/PLATELET
Abs Immature Granulocytes: 0.01 10*3/uL (ref 0.00–0.07)
Basophils Absolute: 0 10*3/uL (ref 0.0–0.1)
Basophils Relative: 0 %
EOS PCT: 2 %
Eosinophils Absolute: 0.1 10*3/uL (ref 0.0–0.5)
HCT: 38.5 % (ref 36.0–46.0)
Hemoglobin: 12.7 g/dL (ref 12.0–15.0)
Immature Granulocytes: 0 %
Lymphocytes Relative: 9 %
Lymphs Abs: 0.3 10*3/uL — ABNORMAL LOW (ref 0.7–4.0)
MCH: 29.7 pg (ref 26.0–34.0)
MCHC: 33 g/dL (ref 30.0–36.0)
MCV: 90 fL (ref 80.0–100.0)
Monocytes Absolute: 0.3 10*3/uL (ref 0.1–1.0)
Monocytes Relative: 7 %
Neutro Abs: 2.9 10*3/uL (ref 1.7–7.7)
Neutrophils Relative %: 82 %
Platelets: 137 10*3/uL — ABNORMAL LOW (ref 150–400)
RBC: 4.28 MIL/uL (ref 3.87–5.11)
RDW: 12.2 % (ref 11.5–15.5)
WBC: 3.6 10*3/uL — ABNORMAL LOW (ref 4.0–10.5)
nRBC: 0 % (ref 0.0–0.2)

## 2018-06-24 LAB — URINALYSIS, COMPLETE (UACMP) WITH MICROSCOPIC
Bilirubin Urine: NEGATIVE
Glucose, UA: NEGATIVE mg/dL
Ketones, ur: NEGATIVE mg/dL
Nitrite: NEGATIVE
Protein, ur: NEGATIVE mg/dL
Specific Gravity, Urine: 1.003 — ABNORMAL LOW (ref 1.005–1.030)
pH: 7 (ref 5.0–8.0)

## 2018-06-24 LAB — COMPREHENSIVE METABOLIC PANEL
ALBUMIN: 4.1 g/dL (ref 3.5–5.0)
ALT: 11 U/L (ref 0–44)
AST: 17 U/L (ref 15–41)
Alkaline Phosphatase: 60 U/L (ref 38–126)
Anion gap: 8 (ref 5–15)
BUN: 12 mg/dL (ref 6–20)
CO2: 28 mmol/L (ref 22–32)
Calcium: 9.7 mg/dL (ref 8.9–10.3)
Chloride: 105 mmol/L (ref 98–111)
Creatinine, Ser: 0.81 mg/dL (ref 0.44–1.00)
GFR calc Af Amer: >60 mL/min (ref >60)
GFR calc non Af Amer: >60 mL/min (ref >60)
GLUCOSE: 93 mg/dL (ref 70–99)
Potassium: 4.6 mmol/L (ref 3.5–5.1)
Sodium: 141 mmol/L (ref 135–145)
Total Bilirubin: 0.4 mg/dL (ref 0.3–1.2)
Total Protein: 7.4 g/dL (ref 6.5–8.1)

## 2018-06-24 LAB — MAGNESIUM: Magnesium: 1.9 mg/dL (ref 1.7–2.4)

## 2018-06-25 ENCOUNTER — Telehealth: Payer: Self-pay | Admitting: Oncology

## 2018-06-25 ENCOUNTER — Ambulatory Visit
Admission: RE | Admit: 2018-06-25 | Discharge: 2018-06-25 | Disposition: A | Discharge status: Home / Self Care | Payer: BLUE CROSS/BLUE SHIELD | Source: Ambulatory Visit | Attending: Radiation Oncology | Admitting: Radiation Oncology

## 2018-06-25 ENCOUNTER — Encounter: Payer: Self-pay | Admitting: Hematology and Oncology

## 2018-06-25 ENCOUNTER — Inpatient Hospital Stay (HOSPITAL_BASED_OUTPATIENT_CLINIC_OR_DEPARTMENT_OTHER): Payer: BLUE CROSS/BLUE SHIELD | Admitting: Hematology and Oncology

## 2018-06-25 DIAGNOSIS — C531 Malignant neoplasm of exocervix: Secondary | ICD-10-CM

## 2018-06-25 DIAGNOSIS — D61818 Other pancytopenia: Secondary | ICD-10-CM

## 2018-06-25 DIAGNOSIS — R110 Nausea: Secondary | ICD-10-CM | POA: Diagnosis not present

## 2018-06-25 DIAGNOSIS — Z510 Encounter for antineoplastic radiation therapy: Secondary | ICD-10-CM | POA: Diagnosis not present

## 2018-06-25 NOTE — Assessment & Plan Note (Signed)
She has mild pancytopenia due to side effects of chemotherapy.  We will continue on reduced dose treatment

## 2018-06-25 NOTE — Assessment & Plan Note (Signed)
She tolerated treatment with expected side effects of nausea, altered taste sensation, constipation, leukopenia and mild weight loss Recently, I have reduced the dose of cisplatin.  We will continue with reduced dose treatment I will continue to see her every week for supportive care

## 2018-06-25 NOTE — Telephone Encounter (Signed)
Called Holly Pena about her appointments this morning.  She said she is on her way.  Advised her to check in for radiation first and then to see Dr. Alvy Bimler.  Also let her interpreter know.

## 2018-06-25 NOTE — Progress Notes (Signed)
[facility] OFFICE PROGRESS NOTE  Patient Care Team: Lucretia Kern, DO as PCP - General (Family Medicine)  ASSESSMENT & PLAN:  Cervical cancer [facility]) She tolerated treatment with expected side effects of nausea, altered taste sensation, constipation, leukopenia and mild weight loss Recently, I have reduced the dose of cisplatin.  We will continue with reduced dose treatment I will continue to see her every week for supportive care  Pancytopenia, acquired [facility]) She has mild pancytopenia due to side effects of chemotherapy.  We will continue on reduced dose treatment  Nausea without vomiting She denies excessive nausea or vomiting.  She is noted to have lost some weight.   We discussed the importance of frequent small meals   No orders of the defined types were placed in this encounter.   INTERVAL HISTORY: Please see below for problem oriented charting. She returns with her husband and interpreter for further follow-up She felt well although she has lost some weight No peripheral neuropathy Denies recent infection, fever or chills Her urinary retention has resolved.  SUMMARY OF ONCOLOGIC HISTORY:   Cervical cancer ([place])   02/20/2018 Initial Diagnosis    She presented with postmenopausal bleeding    03/10/2018 Pathology Results    Endometrium, biopsy - SQUAMOUS CELL CARCINOMA. - SEE COMMENT.    03/17/2018 Imaging    US pelvis: Endometrium measures 7 mm. In the setting of post-menopausal bleeding, endometrial sampling is indicated to exclude carcinoma    03/24/2018 Procedure    Pre-operative Diagnosis:  Suspect SCCa Cervix based on endometrial biopsy  Post-operative Diagnosis:  Stage IB1 (FIGO 2018) SCCa Cervix  Operation:  Exam under anesthesia Cervical biopsies  Operative Findings:  Anterior cervical lip with gross lesion from ~10-12:00. Remainder of cervix grossly normal. Estimate lesion to be <2cm. No vaginal or parametrial involvement.    03/30/2018 Imaging    CT scan of chest, abdomen and pelvis: 1. Isolated low-density structure lateral to the descending colon. Differential considerations include isolated peritoneal implant/metastasis, GI duplication/mesenteric cyst, or postoperative collection such as seroma (clinical history only describes prior Caesarean section). Consider further evaluation with PET. 2. Otherwise, no evidence of metastatic disease in the chest, abdomen, or pelvis.    04/04/2018 PET scan    1. Prominent hypermetabolism within the cervix, consistent with known cervical cancer. No parametrial extension of tumor or abdominopelvic nodal metastatic disease. 2. The previously demonstrated low-density structure posterior to the descending colon demonstrates no hypermetabolic activity and is likely an incidental duplication/mesenteric cyst. 3. Focal hypermetabolic activity within a single right neck lymph node (level 1B). This is unlikely to be related to the patient's cervical cancer. No hypermetabolism is seen within the pharyngeal mucosal space. ENT evaluation should be considered, especially if the patient has risk factors for head and neck malignancy.    04/28/2018 Surgery    Surgeon: Donaciano Eva  Pre-operative Diagnosis: stage IB2 cervical cancer  Post-operative Diagnosis: same  Operation: Robotic-assisted type III radical laparoscopic hysterectomy with bilateral salpingectomy and bilateral pelvic lymphadenectomy  Surgeon: Donaciano Eva  Operative Findings:  : 3.5cm tumor replacing the anterior and right cervix. No gross parametrial involvement.         Specimens: left and right pelvic nodes, uterus with cervix, bilateral tubes and upper vagina. Anterior vaginal margin with marking stitch at 12 o'clock of new true distal anterior vaginal margin         Complications:  None; patient tolerated the procedure well.  04/28/2018 Pathology Results    1. Lymph nodes, regional  resection, right pelvic - EIGHT BENIGN LYMPH NODES (0/8). 2. Lymph nodes, regional resection, left pelvic - SEVEN BENIGN LYMPH NODES (0/7). 3. Uterus, cervix and bilateral fallopian tubes, upper vagina CERVIX: - INVASIVE POORLY DIFFERENTIATED SQUAMOUS CELL CARCINOMA, 4.5 X 2.6 X 1.0 CM. - MARGINS NOT INVOLVED. - LYMPHOVASCULAR INVOLVEMENT BY TUMOR. - METASTATIC CARCINOMA IN TWO OF SIX PARACERVICAL LYMPH NODES (2/6). BENIGN ENDOMETRIUM AND MYOMETRIUM SEE ONCOLOGY TABLE. 4. Vagina, biopsy, anterior vaginal margin - SQUAMOUS MUCOSA WITH SLIGHT INFLAMMATION. - NO EVIDENCE OF INVASIVE CARCINOMA. Microscopic Comment 3. UTERINE CERVIX: Resection  Procedure: Hysterectomy with right and left pelvic regional lymph nodes and anterior vaginal margin. Tumor Size: 4.5 x 2.6 x 1.0 cm. Histologic Type: Squamous cell carcinoma Histologic Grade: Poorly differentiated Stromal Invasion: Yes Depth of stromal invasion (millimeters): 10 mm Horizontal extent longitudinal/length (if applicable#) (millimeters): 25 mm Horizontal extent circumferential/width (if applicable#) (millimeters): 45 mm Other Tissue/ Organ: No Margins: Free of tumor Lymphovascular: Present Regional Lymph Nodes: Two positive for tumor cells (select all that apply) Total Number of Lymph Nodes Examined: Twenty-one Number of Sentinel Nodes Examined (if applicable): 0 Pathologic Stage Classification (pTNM, AJCC 8th Edition): pT1b2, pN1 Ancillary Studies: N/A Representative Tumor Block: 3C, 3D, 3E, 73F, 3J and 3K Comment(s): The tumor is a poorly differentiated invasive squamous cell carcinoma which is up to 1.0 cm (10 mm) in thickness. There is lymphovascular space involvement within the wall of the cervix and in the paracervical connective tissue. The margins of the specimen are not involved by carcinoma (JDP:kh 04/29/18)    05/11/2018 Cancer Staging    Staging form: Cervix Uteri, AJCC 8th Edition - Pathologic stage from 05/11/2018:  Stage III (pT3, pN1, cM0) - Signed by Heath Lark, MD on 05/11/2018    05/12/2018 Imaging    1. Soft tissue inflammation about the bladder could reflect cystitis. Would correlate with the patient's symptoms. 2. Small to moderate amount of free fluid within the pelvis, of uncertain significance. This may simply be postoperative in nature, though if urine output from the Foley catheter decreases, further evaluation could be considered to exclude urine leak.    05/29/2018 Procedure    Placement of a CT injectable subcutaneous port device.    06/12/2018 -  Chemotherapy    She received weekly cisplatin with radiation     REVIEW OF SYSTEMS:   Constitutional: Denies fevers, chills  Eyes: Denies blurriness of vision Ears, nose, mouth, throat, and face: Denies mucositis or sore throat Respiratory: Denies cough, dyspnea or wheezes Cardiovascular: Denies palpitation, chest discomfort or lower extremity swelling Skin: Denies abnormal skin rashes Lymphatics: Denies new lymphadenopathy or easy bruising Neurological:Denies numbness, tingling or new weaknesses Behavioral/Psych: Mood is stable, no new changes  All other systems were reviewed with the patient and are negative.  I have reviewed the past medical history, past surgical history, social history and family history with the patient and they are unchanged from previous note.  ALLERGIES:  has No Known Allergies.  MEDICATIONS:  Current Outpatient Medications  Medication Sig Dispense Refill  . dexamethasone (DECADRON) 4 MG tablet Take 1 tablet (4 mg total) by mouth 2 (two) times daily. 60 tablet 0  . Glucosamine-Chondroitin (MOVE FREE PO) Take 1 tablet by mouth daily.     Marland Kitchen ibuprofen (ADVIL,MOTRIN) 600 MG tablet Take 1 tablet (600 mg total) by mouth every 6 (six) hours as needed for mild pain. 30 tablet 1  . lidocaine-prilocaine (EMLA) cream Apply  to affected area once 30 g 3  . ondansetron (ZOFRAN) 8 MG tablet Take 1 tablet (8 mg total) by  mouth every 8 (eight) hours as needed. Start on the third day after chemotherapy. 30 tablet 1  . prochlorperazine (COMPAZINE) 10 MG tablet Take 1 tablet (10 mg total) by mouth every 6 (six) hours as needed (Nausea or vomiting). 30 tablet 1  . senna-docusate (SENOKOT-S) 8.6-50 MG tablet Take 2 tablets by mouth at bedtime. Do not take if having diarrhea/loose stools 30 tablet 1   No current facility-administered medications for this visit.    Facility-Administered Medications Ordered in Other Visits  Medication Dose Route Frequency Provider Last Rate Last Dose  . heparin lock flush 100 unit/mL  500 Units Intracatheter Once Clair Bardwell, MD      . sodium chloride flush (NS) 0.9 % injection 10 mL  10 mL Intracatheter Once Heath Lark, MD        PHYSICAL EXAMINATION: ECOG PERFORMANCE STATUS: 1 - Symptomatic but completely ambulatory  Vitals:   06/25/18 1037  BP: 104/66  Pulse: 68  Resp: 18  Temp: 98.3 F (36.8 C)  SpO2: 100%   Filed Weights   06/25/18 1037  Weight: 139 lb (63 kg)    GENERAL:alert, no distress and comfortable SKIN: skin color, texture, turgor are normal, no rashes or significant lesions EYES: normal, Conjunctiva are pink and non-injected, sclera clear OROPHARYNX:no exudate, no erythema and lips, buccal mucosa, and tongue normal  NECK: supple, thyroid normal size, non-tender, without nodularity LYMPH:  no palpable lymphadenopathy in the cervical, axillary or inguinal LUNGS: clear to auscultation and percussion with normal breathing effort HEART: regular rate & rhythm and no murmurs and no lower extremity edema ABDOMEN:abdomen soft, non-tender and normal bowel sounds Musculoskeletal:no cyanosis of digits and no clubbing  NEURO: alert & oriented x 3 with fluent speech, no focal motor/sensory deficits  LABORATORY DATA:  I have reviewed the data as listed    Component Value Date/Time   NA 141 06/24/2018 1040   K 4.6 06/24/2018 1040   CL 105 06/24/2018 1040   CO2  28 06/24/2018 1040   GLUCOSE 93 06/24/2018 1040   BUN 12 06/24/2018 1040   CREATININE 0.81 06/24/2018 1040   CALCIUM 9.7 06/24/2018 1040   PROT 7.4 06/24/2018 1040   ALBUMIN 4.1 06/24/2018 1040   AST 17 06/24/2018 1040   ALT 11 06/24/2018 1040   ALKPHOS 60 06/24/2018 1040   BILITOT 0.4 06/24/2018 1040   GFRNONAA >60 06/24/2018 1040   GFRAA >60 06/24/2018 1040    No results found for: SPEP, UPEP  Lab Results  Component Value Date   WBC 3.6 (L) 06/24/2018   NEUTROABS 2.9 06/24/2018   HGB 12.7 06/24/2018   HCT 38.5 06/24/2018   MCV 90.0 06/24/2018   PLT 137 (L) 06/24/2018      Chemistry      Component Value Date/Time   NA 141 06/24/2018 1040   K 4.6 06/24/2018 1040   CL 105 06/24/2018 1040   CO2 28 06/24/2018 1040   BUN 12 06/24/2018 1040   CREATININE 0.81 06/24/2018 1040      Component Value Date/Time   CALCIUM 9.7 06/24/2018 1040   ALKPHOS 60 06/24/2018 1040   AST 17 06/24/2018 1040   ALT 11 06/24/2018 1040   BILITOT 0.4 06/24/2018 1040       RADIOGRAPHIC STUDIES: I have personally reviewed the radiological images as listed and agreed with the findings in the report. Ir Imaging  Guided Port Insertion  Result Date: 05/29/2018 INDICATION: 53 year old with cervical cancer.  Port-A-Cath needed for treatment. EXAM: FLUOROSCOPIC AND ULTRASOUND GUIDED PLACEMENT OF A SUBCUTANEOUS PORT COMPARISON:  None. MEDICATIONS: Ancef 2 g; The antibiotic was administered within an appropriate time interval prior to skin puncture. ANESTHESIA/SEDATION: Versed 2.0 mg IV; Fentanyl 100 mcg IV; Moderate Sedation Time:  30 minutes The patient was continuously monitored during the procedure by the interventional radiology nurse under my direct supervision. FLUOROSCOPY TIME:  36 seconds, 2 mGy COMPLICATIONS: None immediate. PROCEDURE: The procedure, risks, benefits, and alternatives were explained to the patient. Questions regarding the procedure were encouraged and answered. The patient  understands and consents to the procedure. Patient was placed supine on the interventional table. Ultrasound confirmed a patent right internal jugular vein. The right chest and neck were cleaned with a skin antiseptic and a sterile drape was placed. Maximal barrier sterile technique was utilized including caps, mask, sterile gowns, sterile gloves, sterile drape, hand hygiene and skin antiseptic. The right neck was anesthetized with 1% lidocaine. Small incision was made in the right neck with a blade. Micropuncture set was placed in the right internal jugular vein with ultrasound guidance. The micropuncture wire was used for measurement purposes. The right chest was anesthetized with 1% lidocaine with epinephrine. #15 blade was used to make an incision and a subcutaneous port pocket was formed. Nixa was assembled. Subcutaneous tunnel was formed with a stiff tunneling device. The port catheter was brought through the subcutaneous tunnel. The port was placed in the subcutaneous pocket. The micropuncture set was exchanged for a peel-away sheath. The catheter was placed through the peel-away sheath and the tip was positioned at the SVC and right atrium junction. Catheter placement was confirmed with fluoroscopy. The port was accessed and flushed with heparinized saline. The port pocket was closed using two layers of absorbable sutures and Dermabond. The vein skin site was closed using a single layer of absorbable suture and Dermabond. Sterile dressings were applied. Patient tolerated the procedure well without an immediate complication. Ultrasound and fluoroscopic images were taken and saved for this procedure. IMPRESSION: Placement of a CT injectable subcutaneous port device. Electronically Signed   By: Markus Daft M.D.   On: 05/29/2018 13:00    All questions were answered. The patient knows to call the clinic with any problems, questions or concerns. No barriers to learning was detected.  I spent 15  minutes counseling the patient face to face. The total time spent in the appointment was 20 minutes and more than 50% was on counseling and review of test results  Heath Lark, MD 06/25/2018 4:07 PM

## 2018-06-25 NOTE — Assessment & Plan Note (Signed)
She denies excessive nausea or vomiting.  She is noted to have lost some weight.   We discussed the importance of frequent small meals

## 2018-06-26 ENCOUNTER — Ambulatory Visit
Admission: RE | Admit: 2018-06-26 | Discharge: 2018-06-26 | Disposition: A | Discharge status: Home / Self Care | Payer: BLUE CROSS/BLUE SHIELD | Source: Ambulatory Visit | Attending: Radiation Oncology | Admitting: Radiation Oncology

## 2018-06-26 ENCOUNTER — Inpatient Hospital Stay: Payer: BLUE CROSS/BLUE SHIELD

## 2018-06-26 VITALS — BP 118/55 | HR 72 | Temp 98.5°F | Resp 16

## 2018-06-26 DIAGNOSIS — C531 Malignant neoplasm of exocervix: Secondary | ICD-10-CM

## 2018-06-26 DIAGNOSIS — Z510 Encounter for antineoplastic radiation therapy: Secondary | ICD-10-CM | POA: Diagnosis not present

## 2018-06-26 LAB — URINE CULTURE: Culture: >=100000 — AB

## 2018-06-26 MED ORDER — SODIUM CHLORIDE 0.9% FLUSH
10.0000 mL | INTRAVENOUS | Status: DC | PRN
  Administered 2018-06-26: 10 mL
  Filled 2018-06-26: qty 10

## 2018-06-26 MED ORDER — PALONOSETRON HCL INJECTION 0.25 MG/5ML
INTRAVENOUS | Status: AC
  Filled 2018-06-26: qty 5

## 2018-06-26 MED ORDER — SODIUM CHLORIDE 0.9 % IV SOLN
Freq: Once | INTRAVENOUS | Status: AC
  Administered 2018-06-26: 11:00:00 via INTRAVENOUS
  Filled 2018-06-26: qty 5

## 2018-06-26 MED ORDER — PALONOSETRON HCL INJECTION 0.25 MG/5ML
0.2500 mg | Freq: Once | INTRAVENOUS | Status: AC
  Administered 2018-06-26: 0.25 mg via INTRAVENOUS

## 2018-06-26 MED ORDER — POTASSIUM CHLORIDE 2 MEQ/ML IV SOLN
Freq: Once | INTRAVENOUS | Status: AC
  Administered 2018-06-26: 09:00:00 via INTRAVENOUS
  Filled 2018-06-26: qty 10

## 2018-06-26 MED ORDER — HEPARIN SOD (PORK) LOCK FLUSH 100 UNIT/ML IV SOLN
500.0000 [IU] | Freq: Once | INTRAVENOUS | Status: AC | PRN
  Administered 2018-06-26: 500 [IU]
  Filled 2018-06-26: qty 5

## 2018-06-26 MED ORDER — SODIUM CHLORIDE 0.9 % IV SOLN
Freq: Once | INTRAVENOUS | Status: AC
  Administered 2018-06-26: 09:00:00 via INTRAVENOUS
  Filled 2018-06-26: qty 250

## 2018-06-26 MED ORDER — SODIUM CHLORIDE 0.9 % IV SOLN
32.0000 mg/m2 | Freq: Once | INTRAVENOUS | Status: AC
  Administered 2018-06-26: 54 mg via INTRAVENOUS
  Filled 2018-06-26: qty 54

## 2018-06-29 ENCOUNTER — Ambulatory Visit
Admission: RE | Admit: 2018-06-29 | Discharge: 2018-06-29 | Disposition: A | Discharge status: Home / Self Care | Payer: BLUE CROSS/BLUE SHIELD | Source: Ambulatory Visit | Attending: Radiation Oncology | Admitting: Radiation Oncology

## 2018-06-29 DIAGNOSIS — Z510 Encounter for antineoplastic radiation therapy: Secondary | ICD-10-CM | POA: Diagnosis not present

## 2018-06-30 ENCOUNTER — Ambulatory Visit
Admission: RE | Admit: 2018-06-30 | Discharge: 2018-06-30 | Disposition: A | Discharge status: Home / Self Care | Payer: BLUE CROSS/BLUE SHIELD | Source: Ambulatory Visit | Attending: Radiation Oncology | Admitting: Radiation Oncology

## 2018-06-30 ENCOUNTER — Other Ambulatory Visit: Payer: Self-pay | Admitting: Radiation Oncology

## 2018-06-30 DIAGNOSIS — Z510 Encounter for antineoplastic radiation therapy: Secondary | ICD-10-CM | POA: Diagnosis not present

## 2018-06-30 MED ORDER — HYDROCORTISONE 2.5 % RE CREA: 1.0000 "application " | TOPICAL_CREAM | Freq: Two times a day (BID) | RECTAL | 0 refills | Status: DC

## 2018-07-01 ENCOUNTER — Ambulatory Visit
Admission: RE | Admit: 2018-07-01 | Discharge: 2018-07-01 | Disposition: A | Discharge status: Home / Self Care | Payer: BLUE CROSS/BLUE SHIELD | Source: Ambulatory Visit | Attending: Radiation Oncology | Admitting: Radiation Oncology

## 2018-07-01 ENCOUNTER — Inpatient Hospital Stay: Payer: BLUE CROSS/BLUE SHIELD

## 2018-07-01 DIAGNOSIS — Z510 Encounter for antineoplastic radiation therapy: Secondary | ICD-10-CM | POA: Diagnosis not present

## 2018-07-01 DIAGNOSIS — C531 Malignant neoplasm of exocervix: Secondary | ICD-10-CM

## 2018-07-01 LAB — CBC WITH DIFFERENTIAL/PLATELET
Abs Immature Granulocytes: 0.01 10*3/uL (ref 0.00–0.07)
Basophils Absolute: 0 10*3/uL (ref 0.0–0.1)
Basophils Relative: 1 %
Eosinophils Absolute: 0.1 10*3/uL (ref 0.0–0.5)
Eosinophils Relative: 2 %
HCT: 36.2 % (ref 36.0–46.0)
HEMOGLOBIN: 11.9 g/dL — AB (ref 12.0–15.0)
Immature Granulocytes: 0 %
Lymphocytes Relative: 12 %
Lymphs Abs: 0.3 10*3/uL — ABNORMAL LOW (ref 0.7–4.0)
MCH: 29.7 pg (ref 26.0–34.0)
MCHC: 32.9 g/dL (ref 30.0–36.0)
MCV: 90.3 fL (ref 80.0–100.0)
Monocytes Absolute: 0.2 10*3/uL (ref 0.1–1.0)
Monocytes Relative: 8 %
Neutro Abs: 1.7 10*3/uL (ref 1.7–7.7)
Neutrophils Relative %: 77 %
Platelets: 105 10*3/uL — ABNORMAL LOW (ref 150–400)
RBC: 4.01 MIL/uL (ref 3.87–5.11)
RDW: 12.3 % (ref 11.5–15.5)
WBC: 2.3 10*3/uL — ABNORMAL LOW (ref 4.0–10.5)
nRBC: 0 % (ref 0.0–0.2)

## 2018-07-01 LAB — COMPREHENSIVE METABOLIC PANEL
ALT: 13 U/L (ref 0–44)
AST: 16 U/L (ref 15–41)
Albumin: 3.7 g/dL (ref 3.5–5.0)
Alkaline Phosphatase: 58 U/L (ref 38–126)
Anion gap: 8 (ref 5–15)
BUN: 15 mg/dL (ref 6–20)
CO2: 26 mmol/L (ref 22–32)
Calcium: 9.1 mg/dL (ref 8.9–10.3)
Chloride: 106 mmol/L (ref 98–111)
Creatinine, Ser: 0.79 mg/dL (ref 0.44–1.00)
GFR calc Af Amer: >60 mL/min (ref >60)
GFR calc non Af Amer: >60 mL/min (ref >60)
Glucose, Bld: 82 mg/dL (ref 70–99)
Potassium: 4.4 mmol/L (ref 3.5–5.1)
Sodium: 140 mmol/L (ref 135–145)
Total Bilirubin: 0.3 mg/dL (ref 0.3–1.2)
Total Protein: 6.7 g/dL (ref 6.5–8.1)

## 2018-07-01 LAB — MAGNESIUM: Magnesium: 1.9 mg/dL (ref 1.7–2.4)

## 2018-07-02 ENCOUNTER — Ambulatory Visit
Admission: RE | Admit: 2018-07-02 | Discharge: 2018-07-02 | Disposition: A | Discharge status: Home / Self Care | Payer: BLUE CROSS/BLUE SHIELD | Source: Ambulatory Visit | Attending: Radiation Oncology | Admitting: Radiation Oncology

## 2018-07-02 ENCOUNTER — Inpatient Hospital Stay: Payer: BLUE CROSS/BLUE SHIELD | Admitting: Hematology and Oncology

## 2018-07-02 ENCOUNTER — Inpatient Hospital Stay (HOSPITAL_BASED_OUTPATIENT_CLINIC_OR_DEPARTMENT_OTHER): Payer: BLUE CROSS/BLUE SHIELD | Admitting: Hematology and Oncology

## 2018-07-02 ENCOUNTER — Other Ambulatory Visit: Payer: Self-pay | Admitting: Radiation Oncology

## 2018-07-02 DIAGNOSIS — K649 Unspecified hemorrhoids: Secondary | ICD-10-CM | POA: Diagnosis not present

## 2018-07-02 DIAGNOSIS — C531 Malignant neoplasm of exocervix: Secondary | ICD-10-CM | POA: Diagnosis not present

## 2018-07-02 DIAGNOSIS — D61818 Other pancytopenia: Secondary | ICD-10-CM

## 2018-07-02 DIAGNOSIS — Z510 Encounter for antineoplastic radiation therapy: Secondary | ICD-10-CM | POA: Diagnosis not present

## 2018-07-02 MED ORDER — NITROFURANTOIN MONOHYD MACRO 100 MG PO CAPS: 100.0000 mg | ORAL_CAPSULE | Freq: Two times a day (BID) | ORAL | 0 refills | Status: DC

## 2018-07-03 ENCOUNTER — Ambulatory Visit
Admission: RE | Admit: 2018-07-03 | Discharge: 2018-07-03 | Disposition: A | Discharge status: Home / Self Care | Payer: BLUE CROSS/BLUE SHIELD | Source: Ambulatory Visit | Attending: Radiation Oncology | Admitting: Radiation Oncology

## 2018-07-03 ENCOUNTER — Encounter: Payer: Self-pay | Admitting: Hematology and Oncology

## 2018-07-03 ENCOUNTER — Inpatient Hospital Stay: Payer: BLUE CROSS/BLUE SHIELD

## 2018-07-03 VITALS — BP 115/81 | HR 72 | Temp 98.4°F | Resp 18

## 2018-07-03 DIAGNOSIS — Z510 Encounter for antineoplastic radiation therapy: Secondary | ICD-10-CM | POA: Diagnosis not present

## 2018-07-03 DIAGNOSIS — K649 Unspecified hemorrhoids: Secondary | ICD-10-CM | POA: Insufficient documentation

## 2018-07-03 DIAGNOSIS — C531 Malignant neoplasm of exocervix: Secondary | ICD-10-CM

## 2018-07-03 MED ORDER — SODIUM CHLORIDE 0.9 % IV SOLN
32.0000 mg/m2 | Freq: Once | INTRAVENOUS | Status: AC
  Administered 2018-07-03: 54 mg via INTRAVENOUS
  Filled 2018-07-03: qty 54

## 2018-07-03 MED ORDER — SODIUM CHLORIDE 0.9 % IV SOLN
Freq: Once | INTRAVENOUS | Status: AC
  Administered 2018-07-03: 13:00:00 via INTRAVENOUS
  Filled 2018-07-03: qty 5

## 2018-07-03 MED ORDER — PALONOSETRON HCL INJECTION 0.25 MG/5ML
0.2500 mg | Freq: Once | INTRAVENOUS | Status: AC
  Administered 2018-07-03: 0.25 mg via INTRAVENOUS

## 2018-07-03 MED ORDER — SODIUM CHLORIDE 0.9% FLUSH
10.0000 mL | INTRAVENOUS | Status: DC | PRN
  Administered 2018-07-03: 10 mL
  Filled 2018-07-03: qty 10

## 2018-07-03 MED ORDER — PALONOSETRON HCL INJECTION 0.25 MG/5ML
INTRAVENOUS | Status: AC
  Filled 2018-07-03: qty 5

## 2018-07-03 MED ORDER — HEPARIN SOD (PORK) LOCK FLUSH 100 UNIT/ML IV SOLN
500.0000 [IU] | Freq: Once | INTRAVENOUS | Status: AC | PRN
  Administered 2018-07-03: 500 [IU]
  Filled 2018-07-03: qty 5

## 2018-07-03 MED ORDER — POTASSIUM CHLORIDE 2 MEQ/ML IV SOLN
Freq: Once | INTRAVENOUS | Status: AC
  Administered 2018-07-03: 11:00:00 via INTRAVENOUS
  Filled 2018-07-03: qty 10

## 2018-07-03 MED ORDER — SODIUM CHLORIDE 0.9 % IV SOLN
Freq: Once | INTRAVENOUS | Status: AC
  Administered 2018-07-03: 10:00:00 via INTRAVENOUS
  Filled 2018-07-03: qty 250

## 2018-07-03 NOTE — Assessment & Plan Note (Signed)
She tolerated treatment with expected side effects of nausea, altered taste sensation, constipation, pancytopenia, and mild weight loss Recently, I have reduced the dose of cisplatin.  We will continue with reduced dose treatment I will continue to see her every week for supportive care

## 2018-07-03 NOTE — Patient Instructions (Signed)
[facility] Discharge Instructions for Patients Receiving Chemotherapy  Today you received the following chemotherapy agents Cisplatin  To help prevent nausea and vomiting after your treatment, we encourage you to take your nausea medication as directed  If you develop nausea and vomiting that is not controlled by your nausea medication, call the clinic.   BELOW ARE SYMPTOMS THAT SHOULD BE REPORTED IMMEDIATELY:  *FEVER GREATER THAN 100.5 F  *CHILLS WITH OR WITHOUT FEVER  NAUSEA AND VOMITING THAT IS NOT CONTROLLED WITH YOUR NAUSEA MEDICATION  *UNUSUAL SHORTNESS OF BREATH  *UNUSUAL BRUISING OR BLEEDING  TENDERNESS IN MOUTH AND THROAT WITH OR WITHOUT PRESENCE OF ULCERS  *URINARY PROBLEMS  *BOWEL PROBLEMS  UNUSUAL RASH Items with * indicate a potential emergency and should be followed up as soon as possible.  Feel free to call the clinic should you have any questions or concerns. The clinic phone number is [phone].  Please show the CHEMO ALERT CARD at check-in to the Emergency Department and triage nurse.   

## 2018-07-03 NOTE — Assessment & Plan Note (Signed)
She has mild pancytopenia due to side effects of chemotherapy.  We will continue on reduced dose treatment She is not symptomatic

## 2018-07-03 NOTE — Assessment & Plan Note (Signed)
She has hemorrhoids with mild rectal pain. I recommend conservative management and avoidance of suppository use if possible due to pancytopenia and increased risk of infection

## 2018-07-03 NOTE — Progress Notes (Signed)
[place] OFFICE PROGRESS NOTE  Patient Care Team: Lucretia Kern, DO as PCP - General (Family Medicine)  ASSESSMENT & PLAN:  Cervical cancer [facility]) She tolerated treatment with expected side effects of nausea, altered taste sensation, constipation, pancytopenia, and mild weight loss Recently, I have reduced the dose of cisplatin.  We will continue with reduced dose treatment I will continue to see her every week for supportive care  Pancytopenia, acquired [facility]) She has mild pancytopenia due to side effects of chemotherapy.  We will continue on reduced dose treatment She is not symptomatic   Hemorrhoids She has hemorrhoids with mild rectal pain. I recommend conservative management and avoidance of suppository use if possible due to pancytopenia and increased risk of infection   No orders of the defined types were placed in this encounter.   INTERVAL HISTORY: Please see below for problem oriented charting. She returns with her husband and interpreter for further follow-up Since last time I saw her, she complained of fatigue She has mild hemorrhoid irritation and is using some suppository for symptomatic relief She denies peripheral neuropathy No nausea vomiting She has no recent weight loss Denies urinary retention Denies peripheral neuropathy  SUMMARY OF ONCOLOGIC HISTORY:   Cervical cancer ([place])   02/20/2018 Initial Diagnosis    She presented with postmenopausal bleeding    03/10/2018 Pathology Results    Endometrium, biopsy - SQUAMOUS CELL CARCINOMA. - SEE COMMENT.    03/17/2018 Imaging    US pelvis: Endometrium measures 7 mm. In the setting of post-menopausal bleeding, endometrial sampling is indicated to exclude carcinoma    03/24/2018 Procedure    Pre-operative Diagnosis:  Suspect SCCa Cervix based on endometrial biopsy  Post-operative Diagnosis:  Stage IB1 (FIGO 2018) SCCa Cervix  Operation:  Exam under anesthesia Cervical  biopsies  Operative Findings:  Anterior cervical lip with gross lesion from ~10-12:00. Remainder of cervix grossly normal. Estimate lesion to be <2cm. No vaginal or parametrial involvement.    03/30/2018 Imaging    CT scan of chest, abdomen and pelvis: 1. Isolated low-density structure lateral to the descending colon. Differential considerations include isolated peritoneal implant/metastasis, GI duplication/mesenteric cyst, or postoperative collection such as seroma (clinical history only describes prior Caesarean section). Consider further evaluation with PET. 2. Otherwise, no evidence of metastatic disease in the chest, abdomen, or pelvis.    04/04/2018 PET scan    1. Prominent hypermetabolism within the cervix, consistent with known cervical cancer. No parametrial extension of tumor or abdominopelvic nodal metastatic disease. 2. The previously demonstrated low-density structure posterior to the descending colon demonstrates no hypermetabolic activity and is likely an incidental duplication/mesenteric cyst. 3. Focal hypermetabolic activity within a single right neck lymph node (level 1B). This is unlikely to be related to the patient's cervical cancer. No hypermetabolism is seen within the pharyngeal mucosal space. ENT evaluation should be considered, especially if the patient has risk factors for head and neck malignancy.    04/28/2018 Surgery    Surgeon: Donaciano Eva  Pre-operative Diagnosis: stage IB2 cervical cancer  Post-operative Diagnosis: same  Operation: Robotic-assisted type III radical laparoscopic hysterectomy with bilateral salpingectomy and bilateral pelvic lymphadenectomy  Surgeon: Donaciano Eva  Operative Findings:  : 3.5cm tumor replacing the anterior and right cervix. No gross parametrial involvement.         Specimens: left and right pelvic nodes, uterus with cervix, bilateral tubes and upper vagina. Anterior vaginal margin with marking stitch at  12 o'clock of new true distal anterior  vaginal margin         Complications:  None; patient tolerated the procedure well.             04/28/2018 Pathology Results    1. Lymph nodes, regional resection, right pelvic - EIGHT BENIGN LYMPH NODES (0/8). 2. Lymph nodes, regional resection, left pelvic - SEVEN BENIGN LYMPH NODES (0/7). 3. Uterus, cervix and bilateral fallopian tubes, upper vagina CERVIX: - INVASIVE POORLY DIFFERENTIATED SQUAMOUS CELL CARCINOMA, 4.5 X 2.6 X 1.0 CM. - MARGINS NOT INVOLVED. - LYMPHOVASCULAR INVOLVEMENT BY TUMOR. - METASTATIC CARCINOMA IN TWO OF SIX PARACERVICAL LYMPH NODES (2/6). BENIGN ENDOMETRIUM AND MYOMETRIUM SEE ONCOLOGY TABLE. 4. Vagina, biopsy, anterior vaginal margin - SQUAMOUS MUCOSA WITH SLIGHT INFLAMMATION. - NO EVIDENCE OF INVASIVE CARCINOMA. Microscopic Comment 3. UTERINE CERVIX: Resection  Procedure: Hysterectomy with right and left pelvic regional lymph nodes and anterior vaginal margin. Tumor Size: 4.5 x 2.6 x 1.0 cm. Histologic Type: Squamous cell carcinoma Histologic Grade: Poorly differentiated Stromal Invasion: Yes Depth of stromal invasion (millimeters): 10 mm Horizontal extent longitudinal/length (if applicable#) (millimeters): 25 mm Horizontal extent circumferential/width (if applicable#) (millimeters): 45 mm Other Tissue/ Organ: No Margins: Free of tumor Lymphovascular: Present Regional Lymph Nodes: Two positive for tumor cells (select all that apply) Total Number of Lymph Nodes Examined: Twenty-one Number of Sentinel Nodes Examined (if applicable): 0 Pathologic Stage Classification (pTNM, AJCC 8th Edition): pT1b2, pN1 Ancillary Studies: N/A Representative Tumor Block: 3C, 3D, 3E, 57F, 3J and 3K Comment(s): The tumor is a poorly differentiated invasive squamous cell carcinoma which is up to 1.0 cm (10 mm) in thickness. There is lymphovascular space involvement within the wall of the cervix and in the paracervical connective  tissue. The margins of the specimen are not involved by carcinoma (JDP:kh 04/29/18)    05/11/2018 Cancer Staging    Staging form: Cervix Uteri, AJCC 8th Edition - Pathologic stage from 05/11/2018: Stage III (pT3, pN1, cM0) - Signed by Heath Lark, MD on 05/11/2018    05/12/2018 Imaging    1. Soft tissue inflammation about the bladder could reflect cystitis. Would correlate with the patient's symptoms. 2. Small to moderate amount of free fluid within the pelvis, of uncertain significance. This may simply be postoperative in nature, though if urine output from the Foley catheter decreases, further evaluation could be considered to exclude urine leak.    05/29/2018 Procedure    Placement of a CT injectable subcutaneous port device.    06/12/2018 -  Chemotherapy    She received weekly cisplatin with radiation     REVIEW OF SYSTEMS:   Constitutional: Denies fevers, chills or abnormal weight loss Eyes: Denies blurriness of vision Ears, nose, mouth, throat, and face: Denies mucositis or sore throat Respiratory: Denies cough, dyspnea or wheezes Cardiovascular: Denies palpitation, chest discomfort or lower extremity swelling Gastrointestinal:  Denies nausea, heartburn or change in bowel habits Skin: Denies abnormal skin rashes Lymphatics: Denies new lymphadenopathy or easy bruising Neurological:Denies numbness, tingling or new weaknesses Behavioral/Psych: Mood is stable, no new changes  All other systems were reviewed with the patient and are negative.  I have reviewed the past medical history, past surgical history, social history and family history with the patient and they are unchanged from previous note.  ALLERGIES:  has No Known Allergies.  MEDICATIONS:  Current Outpatient Medications  Medication Sig Dispense Refill  . dexamethasone (DECADRON) 4 MG tablet Take 1 tablet (4 mg total) by mouth 2 (two) times daily. 60 tablet 0  . Glucosamine-Chondroitin (MOVE FREE PO)  Take 1 tablet by  mouth daily.     . hydrocortisone (ANUSOL-HC) 2.5 % rectal cream Place 1 application rectally 2 (two) times daily. 30 g 0  . ibuprofen (ADVIL,MOTRIN) 600 MG tablet Take 1 tablet (600 mg total) by mouth every 6 (six) hours as needed for mild pain. 30 tablet 1  . lidocaine-prilocaine (EMLA) cream Apply to affected area once 30 g 3  . nitrofurantoin, macrocrystal-monohydrate, (MACROBID) 100 MG capsule Take 1 capsule (100 mg total) by mouth 2 (two) times daily. 14 capsule 0  . ondansetron (ZOFRAN) 8 MG tablet Take 1 tablet (8 mg total) by mouth every 8 (eight) hours as needed. Start on the third day after chemotherapy. 30 tablet 1  . prochlorperazine (COMPAZINE) 10 MG tablet Take 1 tablet (10 mg total) by mouth every 6 (six) hours as needed (Nausea or vomiting). 30 tablet 1  . senna-docusate (SENOKOT-S) 8.6-50 MG tablet Take 2 tablets by mouth at bedtime. Do not take if having diarrhea/loose stools 30 tablet 1   No current facility-administered medications for this visit.    Facility-Administered Medications Ordered in Other Visits  Medication Dose Route Frequency Provider Last Rate Last Dose  . heparin lock flush 100 unit/mL  500 Units Intracatheter Once Liv Rallis, MD      . sodium chloride flush (NS) 0.9 % injection 10 mL  10 mL Intracatheter Once Alvy Bimler, Haven Foss, MD        PHYSICAL EXAMINATION: ECOG PERFORMANCE STATUS: 1 - Symptomatic but completely ambulatory  Vitals:   07/02/18 1348  BP: (!) 96/52  Pulse: 85  Resp: 18  Temp: 98.2 F (36.8 C)  SpO2: 100%   Filed Weights   07/02/18 1348  Weight: 142 lb (64.4 kg)    GENERAL:alert, no distress and comfortable SKIN: skin color, texture, turgor are normal, no rashes or significant lesions EYES: normal, Conjunctiva are pink and non-injected, sclera clear OROPHARYNX:no exudate, no erythema and lips, buccal mucosa, and tongue normal  NECK: supple, thyroid normal size, non-tender, without nodularity LYMPH:  no palpable lymphadenopathy in  the cervical, axillary or inguinal LUNGS: clear to auscultation and percussion with normal breathing effort HEART: regular rate & rhythm and no murmurs and no lower extremity edema ABDOMEN:abdomen soft, non-tender and normal bowel sounds Musculoskeletal:no cyanosis of digits and no clubbing  NEURO: alert & oriented x 3 with fluent speech, no focal motor/sensory deficits  LABORATORY DATA:  I have reviewed the data as listed    Component Value Date/Time   NA 140 07/01/2018 1011   K 4.4 07/01/2018 1011   CL 106 07/01/2018 1011   CO2 26 07/01/2018 1011   GLUCOSE 82 07/01/2018 1011   BUN 15 07/01/2018 1011   CREATININE 0.79 07/01/2018 1011   CALCIUM 9.1 07/01/2018 1011   PROT 6.7 07/01/2018 1011   ALBUMIN 3.7 07/01/2018 1011   AST 16 07/01/2018 1011   ALT 13 07/01/2018 1011   ALKPHOS 58 07/01/2018 1011   BILITOT 0.3 07/01/2018 1011   GFRNONAA >60 07/01/2018 1011   GFRAA >60 07/01/2018 1011    No results found for: SPEP, UPEP  Lab Results  Component Value Date   WBC 2.3 (L) 07/01/2018   NEUTROABS 1.7 07/01/2018   HGB 11.9 (L) 07/01/2018   HCT 36.2 07/01/2018   MCV 90.3 07/01/2018   PLT 105 (L) 07/01/2018      Chemistry      Component Value Date/Time   NA 140 07/01/2018 1011   K 4.4 07/01/2018 1011   CL  106 07/01/2018 1011   CO2 26 07/01/2018 1011   BUN 15 07/01/2018 1011   CREATININE 0.79 07/01/2018 1011      Component Value Date/Time   CALCIUM 9.1 07/01/2018 1011   ALKPHOS 58 07/01/2018 1011   AST 16 07/01/2018 1011   ALT 13 07/01/2018 1011   BILITOT 0.3 07/01/2018 1011       All questions were answered. The patient knows to call the clinic with any problems, questions or concerns. No barriers to learning was detected.  I spent 15 minutes counseling the patient face to face. The total time spent in the appointment was 20 minutes and more than 50% was on counseling and review of test results  Heath Lark, MD 07/03/2018 7:13 AM

## 2018-07-06 ENCOUNTER — Ambulatory Visit
Admission: RE | Admit: 2018-07-06 | Discharge: 2018-07-06 | Disposition: A | Discharge status: Home / Self Care | Payer: BLUE CROSS/BLUE SHIELD | Source: Ambulatory Visit | Attending: Radiation Oncology | Admitting: Radiation Oncology

## 2018-07-06 ENCOUNTER — Other Ambulatory Visit: Payer: Self-pay

## 2018-07-06 DIAGNOSIS — Z510 Encounter for antineoplastic radiation therapy: Secondary | ICD-10-CM | POA: Diagnosis not present

## 2018-07-06 MED ORDER — NITROFURANTOIN MONOHYD MACRO 100 MG PO CAPS: 100.0000 mg | ORAL_CAPSULE | Freq: Two times a day (BID) | ORAL | 0 refills | Status: DC

## 2018-07-06 NOTE — Telephone Encounter (Signed)
Returned VM to pt's interpreter. Conveyed to interpreter that pt's urine culture had shown infection and an antibiotic had been called in to the [facility] in [facility] Interpreter requests that script be called into [facility] for pt per pt preference. Conveyed that this RN would do so. Interpreter had no further questions on behalf of pt. Loma Sousa, RN BSN

## 2018-07-07 ENCOUNTER — Ambulatory Visit
Admission: RE | Admit: 2018-07-07 | Discharge: 2018-07-07 | Disposition: A | Discharge status: Home / Self Care | Payer: BLUE CROSS/BLUE SHIELD | Source: Ambulatory Visit | Attending: Radiation Oncology | Admitting: Radiation Oncology

## 2018-07-07 DIAGNOSIS — Z510 Encounter for antineoplastic radiation therapy: Secondary | ICD-10-CM | POA: Diagnosis not present

## 2018-07-08 ENCOUNTER — Inpatient Hospital Stay: Payer: BLUE CROSS/BLUE SHIELD

## 2018-07-08 ENCOUNTER — Ambulatory Visit
Admission: RE | Admit: 2018-07-08 | Discharge: 2018-07-08 | Disposition: A | Discharge status: Home / Self Care | Payer: BLUE CROSS/BLUE SHIELD | Source: Ambulatory Visit | Attending: Radiation Oncology | Admitting: Radiation Oncology

## 2018-07-08 DIAGNOSIS — C531 Malignant neoplasm of exocervix: Secondary | ICD-10-CM

## 2018-07-08 DIAGNOSIS — Z510 Encounter for antineoplastic radiation therapy: Secondary | ICD-10-CM | POA: Diagnosis not present

## 2018-07-08 LAB — COMPREHENSIVE METABOLIC PANEL
ALT: 13 U/L (ref 0–44)
AST: 15 U/L (ref 15–41)
Albumin: 3.9 g/dL (ref 3.5–5.0)
Alkaline Phosphatase: 64 U/L (ref 38–126)
Anion gap: 6 (ref 5–15)
BUN: 16 mg/dL (ref 6–20)
CO2: 28 mmol/L (ref 22–32)
Calcium: 9.2 mg/dL (ref 8.9–10.3)
Chloride: 104 mmol/L (ref 98–111)
Creatinine, Ser: 0.74 mg/dL (ref 0.44–1.00)
GFR calc Af Amer: >60 mL/min (ref >60)
Glucose, Bld: 95 mg/dL (ref 70–99)
Potassium: 4.4 mmol/L (ref 3.5–5.1)
Sodium: 138 mmol/L (ref 135–145)
Total Bilirubin: 0.4 mg/dL (ref 0.3–1.2)
Total Protein: 6.9 g/dL (ref 6.5–8.1)

## 2018-07-08 LAB — CBC WITH DIFFERENTIAL/PLATELET
Abs Immature Granulocytes: 0 10*3/uL (ref 0.00–0.07)
Basophils Absolute: 0 10*3/uL (ref 0.0–0.1)
Basophils Relative: 1 %
EOS ABS: 0.1 10*3/uL (ref 0.0–0.5)
Eosinophils Relative: 4 %
HCT: 34.9 % — ABNORMAL LOW (ref 36.0–46.0)
Hemoglobin: 11.9 g/dL — ABNORMAL LOW (ref 12.0–15.0)
Immature Granulocytes: 0 %
Lymphocytes Relative: 9 %
Lymphs Abs: 0.2 10*3/uL — ABNORMAL LOW (ref 0.7–4.0)
MCH: 30.1 pg (ref 26.0–34.0)
MCHC: 34.1 g/dL (ref 30.0–36.0)
MCV: 88.4 fL (ref 80.0–100.0)
Monocytes Absolute: 0.2 10*3/uL (ref 0.1–1.0)
Monocytes Relative: 7 %
Neutro Abs: 1.6 10*3/uL — ABNORMAL LOW (ref 1.7–7.7)
Neutrophils Relative %: 79 %
Platelets: 82 10*3/uL — ABNORMAL LOW (ref 150–400)
RBC: 3.95 MIL/uL (ref 3.87–5.11)
RDW: 12.4 % (ref 11.5–15.5)
WBC: 2.1 10*3/uL — ABNORMAL LOW (ref 4.0–10.5)
nRBC: 0 % (ref 0.0–0.2)

## 2018-07-08 LAB — MAGNESIUM: MAGNESIUM: 1.9 mg/dL (ref 1.7–2.4)

## 2018-07-09 ENCOUNTER — Encounter: Payer: Self-pay | Admitting: Hematology and Oncology

## 2018-07-09 ENCOUNTER — Ambulatory Visit
Admission: RE | Admit: 2018-07-09 | Discharge: 2018-07-09 | Disposition: A | Discharge status: Home / Self Care | Payer: BLUE CROSS/BLUE SHIELD | Source: Ambulatory Visit | Attending: Radiation Oncology | Admitting: Radiation Oncology

## 2018-07-09 ENCOUNTER — Inpatient Hospital Stay (HOSPITAL_BASED_OUTPATIENT_CLINIC_OR_DEPARTMENT_OTHER): Payer: BLUE CROSS/BLUE SHIELD | Admitting: Hematology and Oncology

## 2018-07-09 DIAGNOSIS — C531 Malignant neoplasm of exocervix: Secondary | ICD-10-CM

## 2018-07-09 DIAGNOSIS — D61818 Other pancytopenia: Secondary | ICD-10-CM | POA: Diagnosis not present

## 2018-07-09 DIAGNOSIS — R110 Nausea: Secondary | ICD-10-CM | POA: Diagnosis not present

## 2018-07-09 DIAGNOSIS — N390 Urinary tract infection, site not specified: Secondary | ICD-10-CM | POA: Diagnosis not present

## 2018-07-09 DIAGNOSIS — Z510 Encounter for antineoplastic radiation therapy: Secondary | ICD-10-CM | POA: Diagnosis not present

## 2018-07-09 DIAGNOSIS — B952 Enterococcus as the cause of diseases classified elsewhere: Secondary | ICD-10-CM

## 2018-07-09 MED FILL — OSELTAMIVIR PHOSPHATE 75 MG: 75 | 7 days supply | Qty: 7 | Fill #0

## 2018-07-09 NOTE — Progress Notes (Signed)
[place] OFFICE PROGRESS NOTE  Patient Care Team: Lucretia Kern, DO as PCP - General (Family Medicine)  ASSESSMENT & PLAN:  Cervical cancer [facility]) She has experienced multiple side effects recently with UTI, hemorrhoidal bleeding, excessive fatigue and nausea Due to progressive pancytopenia, we will delay her chemotherapy and hold tomorrow I will see her back next week for further follow-up  Pancytopenia, acquired [facility]) She has progressive pancytopenia due to treatment We will hold treatment tomorrow and rescheduled to next week  Nausea without vomiting I reinforced the importance of taking regular antiemetics daily to help with her nausea  UTI (urinary tract infection) due to Enterococcus She has recurrent UTI, likely predisposed by chronic urinary retention and side effects of chemotherapy She is currently on antibiotic treatment I encouraged her to finish the antibiotic, adequate fluid hydration and frequent urination.   No orders of the defined types were placed in this encounter.   INTERVAL HISTORY: Please see below for problem oriented charting. She returns with her husband and interpreter for further follow-up She is not feeling well She had frequent urination and discomfort She was diagnosed with urinary tract infection and is currently on antibiotic therapy She also have chronic nausea but is not taking her antiemetics as instructed She has some hemorrhoidal bleeding She complained of excessive fatigue No fever or chills The patient denies any recent signs or symptoms of bleeding such as spontaneous epistaxis, hematuria or hematochezia.   SUMMARY OF ONCOLOGIC HISTORY:   Cervical cancer ([place])   02/20/2018 Initial Diagnosis    She presented with postmenopausal bleeding    03/10/2018 Pathology Results    Endometrium, biopsy - SQUAMOUS CELL CARCINOMA. - SEE COMMENT.    03/17/2018 Imaging    US pelvis: Endometrium measures 7 mm. In the setting  of post-menopausal bleeding, endometrial sampling is indicated to exclude carcinoma    03/24/2018 Procedure    Pre-operative Diagnosis:  Suspect SCCa Cervix based on endometrial biopsy  Post-operative Diagnosis:  Stage IB1 (FIGO 2018) SCCa Cervix  Operation:  Exam under anesthesia Cervical biopsies  Operative Findings:  Anterior cervical lip with gross lesion from ~10-12:00. Remainder of cervix grossly normal. Estimate lesion to be <2cm. No vaginal or parametrial involvement.    03/30/2018 Imaging    CT scan of chest, abdomen and pelvis: 1. Isolated low-density structure lateral to the descending colon. Differential considerations include isolated peritoneal implant/metastasis, GI duplication/mesenteric cyst, or postoperative collection such as seroma (clinical history only describes prior Caesarean section). Consider further evaluation with PET. 2. Otherwise, no evidence of metastatic disease in the chest, abdomen, or pelvis.    04/04/2018 PET scan    1. Prominent hypermetabolism within the cervix, consistent with known cervical cancer. No parametrial extension of tumor or abdominopelvic nodal metastatic disease. 2. The previously demonstrated low-density structure posterior to the descending colon demonstrates no hypermetabolic activity and is likely an incidental duplication/mesenteric cyst. 3. Focal hypermetabolic activity within a single right neck lymph node (level 1B). This is unlikely to be related to the patient's cervical cancer. No hypermetabolism is seen within the pharyngeal mucosal space. ENT evaluation should be considered, especially if the patient has risk factors for head and neck malignancy.    04/28/2018 Surgery    Surgeon: Donaciano Eva  Pre-operative Diagnosis: stage IB2 cervical cancer  Post-operative Diagnosis: same  Operation: Robotic-assisted type III radical laparoscopic hysterectomy with bilateral salpingectomy and bilateral pelvic  lymphadenectomy  Surgeon: Donaciano Eva  Operative Findings:  : 3.5cm tumor replacing  the anterior and right cervix. No gross parametrial involvement.         Specimens: left and right pelvic nodes, uterus with cervix, bilateral tubes and upper vagina. Anterior vaginal margin with marking stitch at 12 o'clock of new true distal anterior vaginal margin         Complications:  None; patient tolerated the procedure well.             04/28/2018 Pathology Results    1. Lymph nodes, regional resection, right pelvic - EIGHT BENIGN LYMPH NODES (0/8). 2. Lymph nodes, regional resection, left pelvic - SEVEN BENIGN LYMPH NODES (0/7). 3. Uterus, cervix and bilateral fallopian tubes, upper vagina CERVIX: - INVASIVE POORLY DIFFERENTIATED SQUAMOUS CELL CARCINOMA, 4.5 X 2.6 X 1.0 CM. - MARGINS NOT INVOLVED. - LYMPHOVASCULAR INVOLVEMENT BY TUMOR. - METASTATIC CARCINOMA IN TWO OF SIX PARACERVICAL LYMPH NODES (2/6). BENIGN ENDOMETRIUM AND MYOMETRIUM SEE ONCOLOGY TABLE. 4. Vagina, biopsy, anterior vaginal margin - SQUAMOUS MUCOSA WITH SLIGHT INFLAMMATION. - NO EVIDENCE OF INVASIVE CARCINOMA. Microscopic Comment 3. UTERINE CERVIX: Resection  Procedure: Hysterectomy with right and left pelvic regional lymph nodes and anterior vaginal margin. Tumor Size: 4.5 x 2.6 x 1.0 cm. Histologic Type: Squamous cell carcinoma Histologic Grade: Poorly differentiated Stromal Invasion: Yes Depth of stromal invasion (millimeters): 10 mm Horizontal extent longitudinal/length (if applicable#) (millimeters): 25 mm Horizontal extent circumferential/width (if applicable#) (millimeters): 45 mm Other Tissue/ Organ: No Margins: Free of tumor Lymphovascular: Present Regional Lymph Nodes: Two positive for tumor cells (select all that apply) Total Number of Lymph Nodes Examined: Twenty-one Number of Sentinel Nodes Examined (if applicable): 0 Pathologic Stage Classification (pTNM, AJCC 8th Edition): pT1b2,  pN1 Ancillary Studies: N/A Representative Tumor Block: 3C, 3D, 3E, 7F, 3J and 3K Comment(s): The tumor is a poorly differentiated invasive squamous cell carcinoma which is up to 1.0 cm (10 mm) in thickness. There is lymphovascular space involvement within the wall of the cervix and in the paracervical connective tissue. The margins of the specimen are not involved by carcinoma (JDP:kh 04/29/18)    05/11/2018 Cancer Staging    Staging form: Cervix Uteri, AJCC 8th Edition - Pathologic stage from 05/11/2018: Stage III (pT3, pN1, cM0) - Signed by Heath Lark, MD on 05/11/2018    05/12/2018 Imaging    1. Soft tissue inflammation about the bladder could reflect cystitis. Would correlate with the patient's symptoms. 2. Small to moderate amount of free fluid within the pelvis, of uncertain significance. This may simply be postoperative in nature, though if urine output from the Foley catheter decreases, further evaluation could be considered to exclude urine leak.    05/29/2018 Procedure    Placement of a CT injectable subcutaneous port device.    06/12/2018 -  Chemotherapy    She received weekly cisplatin with radiation     REVIEW OF SYSTEMS:   Constitutional: Denies fevers, chills or abnormal weight loss Eyes: Denies blurriness of vision Ears, nose, mouth, throat, and face: Denies mucositis or sore throat Respiratory: Denies cough, dyspnea or wheezes Cardiovascular: Denies palpitation, chest discomfort or lower extremity swelling Skin: Denies abnormal skin rashes Lymphatics: Denies new lymphadenopathy or easy bruising Neurological:Denies numbness, tingling or new weaknesses Behavioral/Psych: Mood is stable, no new changes  All other systems were reviewed with the patient and are negative.  I have reviewed the past medical history, past surgical history, social history and family history with the patient and they are unchanged from previous note.  ALLERGIES:  has No Known  Allergies.  MEDICATIONS:  Current Outpatient Medications  Medication Sig Dispense Refill  . dexamethasone (DECADRON) 4 MG tablet Take 1 tablet (4 mg total) by mouth 2 (two) times daily. 60 tablet 0  . Glucosamine-Chondroitin (MOVE FREE PO) Take 1 tablet by mouth daily.     . hydrocortisone (ANUSOL-HC) 2.5 % rectal cream Place 1 application rectally 2 (two) times daily. 30 g 0  . ibuprofen (ADVIL,MOTRIN) 600 MG tablet Take 1 tablet (600 mg total) by mouth every 6 (six) hours as needed for mild pain. 30 tablet 1  . lidocaine-prilocaine (EMLA) cream Apply to affected area once 30 g 3  . nitrofurantoin, macrocrystal-monohydrate, (MACROBID) 100 MG capsule Take 1 capsule (100 mg total) by mouth 2 (two) times daily. 14 capsule 0  . ondansetron (ZOFRAN) 8 MG tablet Take 1 tablet (8 mg total) by mouth every 8 (eight) hours as needed. Start on the third day after chemotherapy. 30 tablet 1  . prochlorperazine (COMPAZINE) 10 MG tablet Take 1 tablet (10 mg total) by mouth every 6 (six) hours as needed (Nausea or vomiting). 30 tablet 1  . senna-docusate (SENOKOT-S) 8.6-50 MG tablet Take 2 tablets by mouth at bedtime. Do not take if having diarrhea/loose stools 30 tablet 1   No current facility-administered medications for this visit.    Facility-Administered Medications Ordered in Other Visits  Medication Dose Route Frequency Provider Last Rate Last Dose  . heparin lock flush 100 unit/mL  500 Units Intracatheter Once Ottavio Norem, MD      . sodium chloride flush (NS) 0.9 % injection 10 mL  10 mL Intracatheter Once Alvy Bimler, Serena Petterson, MD        PHYSICAL EXAMINATION: ECOG PERFORMANCE STATUS: 2 - Symptomatic, <50% confined to bed  Vitals:   07/09/18 0846  BP: (!) 102/56  Pulse: 74  Resp: 18  Temp: 97.9 F (36.6 C)  SpO2: 100%   Filed Weights   07/09/18 0846  Weight: 141 lb (64 kg)    GENERAL:alert, no distress and comfortable SKIN: skin color, texture, turgor are normal, no rashes or significant  lesions EYES: normal, Conjunctiva are pink and non-injected, sclera clear OROPHARYNX:no exudate, no erythema and lips, buccal mucosa, and tongue normal  NECK: supple, thyroid normal size, non-tender, without nodularity LYMPH:  no palpable lymphadenopathy in the cervical, axillary or inguinal LUNGS: clear to auscultation and percussion with normal breathing effort HEART: regular rate & rhythm and no murmurs and no lower extremity edema ABDOMEN:abdomen soft, non-tender and normal bowel sounds Musculoskeletal:no cyanosis of digits and no clubbing  NEURO: alert & oriented x 3 with fluent speech, no focal motor/sensory deficits  LABORATORY DATA:  I have reviewed the data as listed    Component Value Date/Time   NA 138 07/08/2018 1026   K 4.4 07/08/2018 1026   CL 104 07/08/2018 1026   CO2 28 07/08/2018 1026   GLUCOSE 95 07/08/2018 1026   BUN 16 07/08/2018 1026   CREATININE 0.74 07/08/2018 1026   CALCIUM 9.2 07/08/2018 1026   PROT 6.9 07/08/2018 1026   ALBUMIN 3.9 07/08/2018 1026   AST 15 07/08/2018 1026   ALT 13 07/08/2018 1026   ALKPHOS 64 07/08/2018 1026   BILITOT 0.4 07/08/2018 1026   GFRNONAA >60 07/08/2018 1026   GFRAA >60 07/08/2018 1026    No results found for: SPEP, UPEP  Lab Results  Component Value Date   WBC 2.1 (L) 07/08/2018   NEUTROABS 1.6 (L) 07/08/2018   HGB 11.9 (L) 07/08/2018   HCT 34.9 (L) 07/08/2018  MCV 88.4 07/08/2018   PLT 82 (L) 07/08/2018      Chemistry      Component Value Date/Time   NA 138 07/08/2018 1026   K 4.4 07/08/2018 1026   CL 104 07/08/2018 1026   CO2 28 07/08/2018 1026   BUN 16 07/08/2018 1026   CREATININE 0.74 07/08/2018 1026      Component Value Date/Time   CALCIUM 9.2 07/08/2018 1026   ALKPHOS 64 07/08/2018 1026   AST 15 07/08/2018 1026   ALT 13 07/08/2018 1026   BILITOT 0.4 07/08/2018 1026     All questions were answered. The patient knows to call the clinic with any problems, questions or concerns. No barriers to  learning was detected.  I spent 25 minutes counseling the patient face to face. The total time spent in the appointment was 30 minutes and more than 50% was on counseling and review of test results  Heath Lark, MD 07/09/2018 10:03 AM

## 2018-07-09 NOTE — Assessment & Plan Note (Signed)
I reinforced the importance of taking regular antiemetics daily to help with her nausea

## 2018-07-09 NOTE — Assessment & Plan Note (Signed)
She has progressive pancytopenia due to treatment We will hold treatment tomorrow and rescheduled to next week

## 2018-07-09 NOTE — Assessment & Plan Note (Signed)
She has experienced multiple side effects recently with UTI, hemorrhoidal bleeding, excessive fatigue and nausea Due to progressive pancytopenia, we will delay her chemotherapy and hold tomorrow I will see her back next week for further follow-up

## 2018-07-09 NOTE — Assessment & Plan Note (Signed)
She has recurrent UTI, likely predisposed by chronic urinary retention and side effects of chemotherapy She is currently on antibiotic treatment I encouraged her to finish the antibiotic, adequate fluid hydration and frequent urination.

## 2018-07-10 ENCOUNTER — Ambulatory Visit
Admission: RE | Admit: 2018-07-10 | Discharge: 2018-07-10 | Disposition: A | Discharge status: Home / Self Care | Payer: BLUE CROSS/BLUE SHIELD | Source: Ambulatory Visit | Attending: Radiation Oncology | Admitting: Radiation Oncology

## 2018-07-10 ENCOUNTER — Inpatient Hospital Stay: Payer: BLUE CROSS/BLUE SHIELD

## 2018-07-10 DIAGNOSIS — Z510 Encounter for antineoplastic radiation therapy: Secondary | ICD-10-CM | POA: Diagnosis not present

## 2018-07-13 ENCOUNTER — Ambulatory Visit
Admission: RE | Admit: 2018-07-13 | Discharge: 2018-07-13 | Disposition: A | Discharge status: Home / Self Care | Payer: BLUE CROSS/BLUE SHIELD | Source: Ambulatory Visit | Attending: Radiation Oncology | Admitting: Radiation Oncology

## 2018-07-13 DIAGNOSIS — Z9221 Personal history of antineoplastic chemotherapy: Secondary | ICD-10-CM | POA: Diagnosis not present

## 2018-07-13 DIAGNOSIS — Z79899 Other long term (current) drug therapy: Secondary | ICD-10-CM | POA: Diagnosis not present

## 2018-07-13 DIAGNOSIS — D61818 Other pancytopenia: Secondary | ICD-10-CM | POA: Diagnosis not present

## 2018-07-13 DIAGNOSIS — C531 Malignant neoplasm of exocervix: Secondary | ICD-10-CM

## 2018-07-13 DIAGNOSIS — Z90722 Acquired absence of ovaries, bilateral: Secondary | ICD-10-CM | POA: Diagnosis not present

## 2018-07-13 DIAGNOSIS — R300 Dysuria: Secondary | ICD-10-CM | POA: Diagnosis not present

## 2018-07-13 DIAGNOSIS — Z923 Personal history of irradiation: Secondary | ICD-10-CM | POA: Diagnosis not present

## 2018-07-13 DIAGNOSIS — Z90710 Acquired absence of both cervix and uterus: Secondary | ICD-10-CM | POA: Diagnosis not present

## 2018-07-13 DIAGNOSIS — Z510 Encounter for antineoplastic radiation therapy: Secondary | ICD-10-CM

## 2018-07-13 DIAGNOSIS — Z791 Long term (current) use of non-steroidal anti-inflammatories (NSAID): Secondary | ICD-10-CM | POA: Diagnosis not present

## 2018-07-13 DIAGNOSIS — K649 Unspecified hemorrhoids: Secondary | ICD-10-CM | POA: Diagnosis not present

## 2018-07-13 DIAGNOSIS — R5383 Other fatigue: Secondary | ICD-10-CM | POA: Diagnosis not present

## 2018-07-14 ENCOUNTER — Ambulatory Visit
Admission: RE | Admit: 2018-07-14 | Discharge: 2018-07-14 | Disposition: A | Discharge status: Home / Self Care | Payer: BLUE CROSS/BLUE SHIELD | Source: Ambulatory Visit | Attending: Radiation Oncology | Admitting: Radiation Oncology

## 2018-07-14 DIAGNOSIS — C531 Malignant neoplasm of exocervix: Secondary | ICD-10-CM | POA: Diagnosis not present

## 2018-07-14 DIAGNOSIS — Z510 Encounter for antineoplastic radiation therapy: Secondary | ICD-10-CM | POA: Diagnosis not present

## 2018-07-15 ENCOUNTER — Inpatient Hospital Stay: Payer: BLUE CROSS/BLUE SHIELD | Attending: Obstetrics

## 2018-07-15 ENCOUNTER — Ambulatory Visit
Admission: RE | Admit: 2018-07-15 | Discharge: 2018-07-15 | Disposition: A | Discharge status: Home / Self Care | Payer: BLUE CROSS/BLUE SHIELD | Source: Ambulatory Visit | Attending: Radiation Oncology | Admitting: Radiation Oncology

## 2018-07-15 DIAGNOSIS — Z923 Personal history of irradiation: Secondary | ICD-10-CM | POA: Insufficient documentation

## 2018-07-15 DIAGNOSIS — R300 Dysuria: Secondary | ICD-10-CM | POA: Insufficient documentation

## 2018-07-15 DIAGNOSIS — Z90710 Acquired absence of both cervix and uterus: Secondary | ICD-10-CM | POA: Insufficient documentation

## 2018-07-15 DIAGNOSIS — C531 Malignant neoplasm of exocervix: Secondary | ICD-10-CM | POA: Diagnosis not present

## 2018-07-15 DIAGNOSIS — Z90722 Acquired absence of ovaries, bilateral: Secondary | ICD-10-CM | POA: Insufficient documentation

## 2018-07-15 DIAGNOSIS — Z791 Long term (current) use of non-steroidal anti-inflammatories (NSAID): Secondary | ICD-10-CM | POA: Insufficient documentation

## 2018-07-15 DIAGNOSIS — Z79899 Other long term (current) drug therapy: Secondary | ICD-10-CM | POA: Insufficient documentation

## 2018-07-15 DIAGNOSIS — R5383 Other fatigue: Secondary | ICD-10-CM | POA: Insufficient documentation

## 2018-07-15 DIAGNOSIS — K649 Unspecified hemorrhoids: Secondary | ICD-10-CM | POA: Insufficient documentation

## 2018-07-15 DIAGNOSIS — D61818 Other pancytopenia: Secondary | ICD-10-CM | POA: Insufficient documentation

## 2018-07-15 DIAGNOSIS — Z9221 Personal history of antineoplastic chemotherapy: Secondary | ICD-10-CM | POA: Insufficient documentation

## 2018-07-15 LAB — CBC WITH DIFFERENTIAL/PLATELET
Abs Immature Granulocytes: 0 10*3/uL (ref 0.00–0.07)
Basophils Absolute: 0 10*3/uL (ref 0.0–0.1)
Basophils Relative: 1 %
Eosinophils Absolute: 0 10*3/uL (ref 0.0–0.5)
Eosinophils Relative: 3 %
HEMATOCRIT: 34.4 % — AB (ref 36.0–46.0)
Hemoglobin: 11.3 g/dL — ABNORMAL LOW (ref 12.0–15.0)
Immature Granulocytes: 0 %
LYMPHS ABS: 0.2 10*3/uL — AB (ref 0.7–4.0)
Lymphocytes Relative: 13 %
MCH: 29.8 pg (ref 26.0–34.0)
MCHC: 32.8 g/dL (ref 30.0–36.0)
MCV: 90.8 fL (ref 80.0–100.0)
Monocytes Absolute: 0.1 10*3/uL (ref 0.1–1.0)
Monocytes Relative: 9 %
Neutro Abs: 1.1 10*3/uL — ABNORMAL LOW (ref 1.7–7.7)
Neutrophils Relative %: 74 %
Platelets: 50 10*3/uL — ABNORMAL LOW (ref 150–400)
RBC: 3.79 MIL/uL — ABNORMAL LOW (ref 3.87–5.11)
RDW: 13.1 % (ref 11.5–15.5)
WBC: 1.4 10*3/uL — ABNORMAL LOW (ref 4.0–10.5)
nRBC: 0 % (ref 0.0–0.2)

## 2018-07-15 LAB — COMPREHENSIVE METABOLIC PANEL
ALT: 10 U/L (ref 0–44)
AST: 16 U/L (ref 15–41)
Albumin: 3.7 g/dL (ref 3.5–5.0)
Alkaline Phosphatase: 63 U/L (ref 38–126)
Anion gap: 7 (ref 5–15)
BUN: 11 mg/dL (ref 6–20)
CO2: 28 mmol/L (ref 22–32)
Calcium: 9.2 mg/dL (ref 8.9–10.3)
Chloride: 106 mmol/L (ref 98–111)
Creatinine, Ser: 0.8 mg/dL (ref 0.44–1.00)
GFR calc Af Amer: >60 mL/min (ref >60)
GFR calc non Af Amer: >60 mL/min (ref >60)
Glucose, Bld: 85 mg/dL (ref 70–99)
Potassium: 4.1 mmol/L (ref 3.5–5.1)
SODIUM: 141 mmol/L (ref 135–145)
Total Bilirubin: 0.3 mg/dL (ref 0.3–1.2)
Total Protein: 6.7 g/dL (ref 6.5–8.1)

## 2018-07-15 LAB — MAGNESIUM: Magnesium: 1.8 mg/dL (ref 1.7–2.4)

## 2018-07-16 ENCOUNTER — Telehealth: Payer: Self-pay | Admitting: Hematology and Oncology

## 2018-07-16 ENCOUNTER — Inpatient Hospital Stay (HOSPITAL_BASED_OUTPATIENT_CLINIC_OR_DEPARTMENT_OTHER): Payer: BLUE CROSS/BLUE SHIELD | Admitting: Hematology and Oncology

## 2018-07-16 ENCOUNTER — Encounter: Payer: Self-pay | Admitting: Hematology and Oncology

## 2018-07-16 ENCOUNTER — Ambulatory Visit
Admission: RE | Admit: 2018-07-16 | Discharge: 2018-07-16 | Disposition: A | Discharge status: Home / Self Care | Payer: BLUE CROSS/BLUE SHIELD | Source: Ambulatory Visit | Attending: Radiation Oncology | Admitting: Radiation Oncology

## 2018-07-16 ENCOUNTER — Ambulatory Visit: Payer: BLUE CROSS/BLUE SHIELD

## 2018-07-16 DIAGNOSIS — Z791 Long term (current) use of non-steroidal anti-inflammatories (NSAID): Secondary | ICD-10-CM

## 2018-07-16 DIAGNOSIS — C531 Malignant neoplasm of exocervix: Secondary | ICD-10-CM | POA: Diagnosis not present

## 2018-07-16 DIAGNOSIS — Z9221 Personal history of antineoplastic chemotherapy: Secondary | ICD-10-CM

## 2018-07-16 DIAGNOSIS — R5383 Other fatigue: Secondary | ICD-10-CM

## 2018-07-16 DIAGNOSIS — Z79899 Other long term (current) drug therapy: Secondary | ICD-10-CM

## 2018-07-16 DIAGNOSIS — K649 Unspecified hemorrhoids: Secondary | ICD-10-CM

## 2018-07-16 DIAGNOSIS — D61818 Other pancytopenia: Secondary | ICD-10-CM

## 2018-07-16 DIAGNOSIS — Z923 Personal history of irradiation: Secondary | ICD-10-CM

## 2018-07-16 NOTE — Telephone Encounter (Signed)
Gave avs and calendar ° °

## 2018-07-16 NOTE — Progress Notes (Signed)
[place] OFFICE PROGRESS NOTE  Patient Care Team: Lucretia Kern, DO as PCP - General (Family Medicine)  ASSESSMENT & PLAN:  Cervical cancer [facility]) Unfortunately, she has progressive pancytopenia even though clinically, she felt better with resolution of nausea She still has some mild hemorrhoidal bleeding but she is managing conservatively I will cancel her chemotherapy tomorrow I will see her back next week with repeat blood work and supportive care  Pancytopenia, acquired [facility]) This is due to bone marrow suppressive effect from recent chemo and radiation She does not need transfusion support I will cancel her treatment tomorrow I will see her back next week for further follow-up and repeat blood work  Hemorrhoids She has hemorrhoids with mild rectal pain. I recommend conservative management and avoidance of suppository use if possible due to pancytopenia and increased risk of infection   No orders of the defined types were placed in this encounter.   INTERVAL HISTORY: Please see below for problem oriented charting. She returns with her husband and [place] interpreter She is seen prior to last dose of chemotherapy She complained of mild fatigue but nausea has improved She continues to have hemorrhoidal irritation No recent infection, fever or chills She bruises easily The patient denies any recent signs or symptoms of bleeding such as spontaneous epistaxis, hematuria or hematochezia.   SUMMARY OF ONCOLOGIC HISTORY:   Cervical cancer ([place])   02/20/2018 Initial Diagnosis    She presented with postmenopausal bleeding    03/10/2018 Pathology Results    Endometrium, biopsy - SQUAMOUS CELL CARCINOMA. - SEE COMMENT.    03/17/2018 Imaging    US pelvis: Endometrium measures 7 mm. In the setting of post-menopausal bleeding, endometrial sampling is indicated to exclude carcinoma    03/24/2018 Procedure    Pre-operative Diagnosis:  Suspect SCCa Cervix based on  endometrial biopsy  Post-operative Diagnosis:  Stage IB1 (FIGO 2018) SCCa Cervix  Operation:  Exam under anesthesia Cervical biopsies  Operative Findings:  Anterior cervical lip with gross lesion from ~10-12:00. Remainder of cervix grossly normal. Estimate lesion to be <2cm. No vaginal or parametrial involvement.    03/30/2018 Imaging    CT scan of chest, abdomen and pelvis: 1. Isolated low-density structure lateral to the descending colon. Differential considerations include isolated peritoneal implant/metastasis, GI duplication/mesenteric cyst, or postoperative collection such as seroma (clinical history only describes prior Caesarean section). Consider further evaluation with PET. 2. Otherwise, no evidence of metastatic disease in the chest, abdomen, or pelvis.    04/04/2018 PET scan    1. Prominent hypermetabolism within the cervix, consistent with known cervical cancer. No parametrial extension of tumor or abdominopelvic nodal metastatic disease. 2. The previously demonstrated low-density structure posterior to the descending colon demonstrates no hypermetabolic activity and is likely an incidental duplication/mesenteric cyst. 3. Focal hypermetabolic activity within a single right neck lymph node (level 1B). This is unlikely to be related to the patient's cervical cancer. No hypermetabolism is seen within the pharyngeal mucosal space. ENT evaluation should be considered, especially if the patient has risk factors for head and neck malignancy.    04/28/2018 Surgery    Surgeon: Donaciano Eva  Pre-operative Diagnosis: stage IB2 cervical cancer  Post-operative Diagnosis: same  Operation: Robotic-assisted type III radical laparoscopic hysterectomy with bilateral salpingectomy and bilateral pelvic lymphadenectomy  Surgeon: Donaciano Eva  Operative Findings:  : 3.5cm tumor replacing the anterior and right cervix. No gross parametrial involvement.          Specimens: left and  right pelvic nodes, uterus with cervix, bilateral tubes and upper vagina. Anterior vaginal margin with marking stitch at 12 o'clock of new true distal anterior vaginal margin         Complications:  None; patient tolerated the procedure well.             04/28/2018 Pathology Results    1. Lymph nodes, regional resection, right pelvic - EIGHT BENIGN LYMPH NODES (0/8). 2. Lymph nodes, regional resection, left pelvic - SEVEN BENIGN LYMPH NODES (0/7). 3. Uterus, cervix and bilateral fallopian tubes, upper vagina CERVIX: - INVASIVE POORLY DIFFERENTIATED SQUAMOUS CELL CARCINOMA, 4.5 X 2.6 X 1.0 CM. - MARGINS NOT INVOLVED. - LYMPHOVASCULAR INVOLVEMENT BY TUMOR. - METASTATIC CARCINOMA IN TWO OF SIX PARACERVICAL LYMPH NODES (2/6). BENIGN ENDOMETRIUM AND MYOMETRIUM SEE ONCOLOGY TABLE. 4. Vagina, biopsy, anterior vaginal margin - SQUAMOUS MUCOSA WITH SLIGHT INFLAMMATION. - NO EVIDENCE OF INVASIVE CARCINOMA. Microscopic Comment 3. UTERINE CERVIX: Resection  Procedure: Hysterectomy with right and left pelvic regional lymph nodes and anterior vaginal margin. Tumor Size: 4.5 x 2.6 x 1.0 cm. Histologic Type: Squamous cell carcinoma Histologic Grade: Poorly differentiated Stromal Invasion: Yes Depth of stromal invasion (millimeters): 10 mm Horizontal extent longitudinal/length (if applicable#) (millimeters): 25 mm Horizontal extent circumferential/width (if applicable#) (millimeters): 45 mm Other Tissue/ Organ: No Margins: Free of tumor Lymphovascular: Present Regional Lymph Nodes: Two positive for tumor cells (select all that apply) Total Number of Lymph Nodes Examined: Twenty-one Number of Sentinel Nodes Examined (if applicable): 0 Pathologic Stage Classification (pTNM, AJCC 8th Edition): pT1b2, pN1 Ancillary Studies: N/A Representative Tumor Block: 3C, 3D, 3E, 29F, 3J and 3K Comment(s): The tumor is a poorly differentiated invasive squamous cell carcinoma which is up  to 1.0 cm (10 mm) in thickness. There is lymphovascular space involvement within the wall of the cervix and in the paracervical connective tissue. The margins of the specimen are not involved by carcinoma (JDP:kh 04/29/18)    05/11/2018 Cancer Staging    Staging form: Cervix Uteri, AJCC 8th Edition - Pathologic stage from 05/11/2018: Stage III (pT3, pN1, cM0) - Signed by Heath Lark, MD on 05/11/2018    05/12/2018 Imaging    1. Soft tissue inflammation about the bladder could reflect cystitis. Would correlate with the patient's symptoms. 2. Small to moderate amount of free fluid within the pelvis, of uncertain significance. This may simply be postoperative in nature, though if urine output from the Foley catheter decreases, further evaluation could be considered to exclude urine leak.    05/29/2018 Procedure    Placement of a CT injectable subcutaneous port device.    06/12/2018 -  Chemotherapy    She received weekly cisplatin with radiation     REVIEW OF SYSTEMS:   Constitutional: Denies fevers, chills or abnormal weight loss Eyes: Denies blurriness of vision Ears, nose, mouth, throat, and face: Denies mucositis or sore throat Respiratory: Denies cough, dyspnea or wheezes Cardiovascular: Denies palpitation, chest discomfort or lower extremity swelling Gastrointestinal:  Denies nausea, heartburn or change in bowel habits Skin: Denies abnormal skin rashes Lymphatics: Denies new lymphadenopathy  Neurological:Denies numbness, tingling or new weaknesses Behavioral/Psych: Mood is stable, no new changes  All other systems were reviewed with the patient and are negative.  I have reviewed the past medical history, past surgical history, social history and family history with the patient and they are unchanged from previous note.  ALLERGIES:  has No Known Allergies.  MEDICATIONS:  Current Outpatient Medications  Medication Sig Dispense Refill  . Glucosamine-Chondroitin (MOVE FREE  PO) Take  1 tablet by mouth daily.     . hydrocortisone (ANUSOL-HC) 2.5 % rectal cream Place 1 application rectally 2 (two) times daily. 30 g 0  . ibuprofen (ADVIL,MOTRIN) 600 MG tablet Take 1 tablet (600 mg total) by mouth every 6 (six) hours as needed for mild pain. 30 tablet 1  . lidocaine-prilocaine (EMLA) cream Apply to affected area once 30 g 3  . ondansetron (ZOFRAN) 8 MG tablet Take 1 tablet (8 mg total) by mouth every 8 (eight) hours as needed. Start on the third day after chemotherapy. 30 tablet 1  . prochlorperazine (COMPAZINE) 10 MG tablet Take 1 tablet (10 mg total) by mouth every 6 (six) hours as needed (Nausea or vomiting). 30 tablet 1  . senna-docusate (SENOKOT-S) 8.6-50 MG tablet Take 2 tablets by mouth at bedtime. Do not take if having diarrhea/loose stools 30 tablet 1   No current facility-administered medications for this visit.    Facility-Administered Medications Ordered in Other Visits  Medication Dose Route Frequency Provider Last Rate Last Dose  . heparin lock flush 100 unit/mL  500 Units Intracatheter Once Alvy Bimler, Alfonzo Arca, MD      . sodium chloride flush (NS) 0.9 % injection 10 mL  10 mL Intracatheter Once Alvy Bimler, Floetta Brickey, MD        PHYSICAL EXAMINATION: ECOG PERFORMANCE STATUS: 1 - Symptomatic but completely ambulatory  Vitals:   07/16/18 1003  BP: 94/66  Pulse: 75  Resp: 18  Temp: 98.3 F (36.8 C)  SpO2: 100%   Filed Weights   07/16/18 1003  Weight: 141 lb 3.2 oz (64 kg)    GENERAL:alert, no distress and comfortable SKIN: skin color, texture, turgor are normal, no rashes or significant lesions EYES: normal, Conjunctiva are pink and non-injected, sclera clear OROPHARYNX:no exudate, no erythema and lips, buccal mucosa, and tongue normal  NECK: supple, thyroid normal size, non-tender, without nodularity LYMPH:  no palpable lymphadenopathy in the cervical, axillary or inguinal LUNGS: clear to auscultation and percussion with normal breathing effort HEART: regular rate &  rhythm and no murmurs and no lower extremity edema ABDOMEN:abdomen soft, non-tender and normal bowel sounds Musculoskeletal:no cyanosis of digits and no clubbing  NEURO: alert & oriented x 3 with fluent speech, no focal motor/sensory deficits  LABORATORY DATA:  I have reviewed the data as listed    Component Value Date/Time   NA 141 07/15/2018 1011   K 4.1 07/15/2018 1011   CL 106 07/15/2018 1011   CO2 28 07/15/2018 1011   GLUCOSE 85 07/15/2018 1011   BUN 11 07/15/2018 1011   CREATININE 0.80 07/15/2018 1011   CALCIUM 9.2 07/15/2018 1011   PROT 6.7 07/15/2018 1011   ALBUMIN 3.7 07/15/2018 1011   AST 16 07/15/2018 1011   ALT 10 07/15/2018 1011   ALKPHOS 63 07/15/2018 1011   BILITOT 0.3 07/15/2018 1011   GFRNONAA >60 07/15/2018 1011   GFRAA >60 07/15/2018 1011    No results found for: SPEP, UPEP  Lab Results  Component Value Date   WBC 1.4 (L) 07/15/2018   NEUTROABS 1.1 (L) 07/15/2018   HGB 11.3 (L) 07/15/2018   HCT 34.4 (L) 07/15/2018   MCV 90.8 07/15/2018   PLT 50 (L) 07/15/2018      Chemistry      Component Value Date/Time   NA 141 07/15/2018 1011   K 4.1 07/15/2018 1011   CL 106 07/15/2018 1011   CO2 28 07/15/2018 1011   BUN 11 07/15/2018 1011   CREATININE 0.80  07/15/2018 1011      Component Value Date/Time   CALCIUM 9.2 07/15/2018 1011   ALKPHOS 63 07/15/2018 1011   AST 16 07/15/2018 1011   ALT 10 07/15/2018 1011   BILITOT 0.3 07/15/2018 1011       All questions were answered. The patient knows to call the clinic with any problems, questions or concerns. No barriers to learning was detected.  I spent 15 minutes counseling the patient face to face. The total time spent in the appointment was 20 minutes and more than 50% was on counseling and review of test results  Heath Lark, MD 07/16/2018 10:56 AM

## 2018-07-16 NOTE — Assessment & Plan Note (Signed)
Unfortunately, she has progressive pancytopenia even though clinically, she felt better with resolution of nausea She still has some mild hemorrhoidal bleeding but she is managing conservatively I will cancel her chemotherapy tomorrow I will see her back next week with repeat blood work and supportive care

## 2018-07-16 NOTE — Assessment & Plan Note (Signed)
She has hemorrhoids with mild rectal pain. I recommend conservative management and avoidance of suppository use if possible due to pancytopenia and increased risk of infection

## 2018-07-16 NOTE — Assessment & Plan Note (Signed)
This is due to bone marrow suppressive effect from recent chemo and radiation She does not need transfusion support I will cancel her treatment tomorrow I will see her back next week for further follow-up and repeat blood work

## 2018-07-17 ENCOUNTER — Ambulatory Visit: Payer: BLUE CROSS/BLUE SHIELD

## 2018-07-17 ENCOUNTER — Inpatient Hospital Stay: Payer: BLUE CROSS/BLUE SHIELD

## 2018-07-17 ENCOUNTER — Ambulatory Visit
Admission: RE | Admit: 2018-07-17 | Discharge: 2018-07-17 | Disposition: A | Discharge status: Home / Self Care | Payer: BLUE CROSS/BLUE SHIELD | Source: Ambulatory Visit | Attending: Radiation Oncology | Admitting: Radiation Oncology

## 2018-07-17 DIAGNOSIS — C531 Malignant neoplasm of exocervix: Secondary | ICD-10-CM | POA: Diagnosis not present

## 2018-07-20 ENCOUNTER — Ambulatory Visit: Payer: BLUE CROSS/BLUE SHIELD

## 2018-07-20 ENCOUNTER — Ambulatory Visit
Admission: RE | Admit: 2018-07-20 | Discharge: 2018-07-20 | Disposition: A | Discharge status: Home / Self Care | Payer: BLUE CROSS/BLUE SHIELD | Source: Ambulatory Visit | Attending: Radiation Oncology | Admitting: Radiation Oncology

## 2018-07-20 DIAGNOSIS — C531 Malignant neoplasm of exocervix: Secondary | ICD-10-CM | POA: Diagnosis not present

## 2018-07-21 ENCOUNTER — Inpatient Hospital Stay (HOSPITAL_BASED_OUTPATIENT_CLINIC_OR_DEPARTMENT_OTHER): Payer: BLUE CROSS/BLUE SHIELD | Admitting: Hematology and Oncology

## 2018-07-21 ENCOUNTER — Ambulatory Visit
Admission: RE | Admit: 2018-07-21 | Discharge: 2018-07-21 | Disposition: A | Discharge status: Home / Self Care | Payer: BLUE CROSS/BLUE SHIELD | Source: Ambulatory Visit | Attending: Radiation Oncology | Admitting: Radiation Oncology

## 2018-07-21 ENCOUNTER — Inpatient Hospital Stay: Payer: BLUE CROSS/BLUE SHIELD

## 2018-07-21 ENCOUNTER — Other Ambulatory Visit: Payer: Self-pay

## 2018-07-21 ENCOUNTER — Encounter: Payer: Self-pay | Admitting: Hematology and Oncology

## 2018-07-21 DIAGNOSIS — Z9221 Personal history of antineoplastic chemotherapy: Secondary | ICD-10-CM

## 2018-07-21 DIAGNOSIS — R5383 Other fatigue: Secondary | ICD-10-CM

## 2018-07-21 DIAGNOSIS — Z79899 Other long term (current) drug therapy: Secondary | ICD-10-CM

## 2018-07-21 DIAGNOSIS — Z791 Long term (current) use of non-steroidal anti-inflammatories (NSAID): Secondary | ICD-10-CM

## 2018-07-21 DIAGNOSIS — Z923 Personal history of irradiation: Secondary | ICD-10-CM | POA: Diagnosis not present

## 2018-07-21 DIAGNOSIS — D61818 Other pancytopenia: Secondary | ICD-10-CM

## 2018-07-21 DIAGNOSIS — R300 Dysuria: Secondary | ICD-10-CM | POA: Insufficient documentation

## 2018-07-21 DIAGNOSIS — C531 Malignant neoplasm of exocervix: Secondary | ICD-10-CM | POA: Diagnosis not present

## 2018-07-21 DIAGNOSIS — Z90710 Acquired absence of both cervix and uterus: Secondary | ICD-10-CM

## 2018-07-21 DIAGNOSIS — Z90722 Acquired absence of ovaries, bilateral: Secondary | ICD-10-CM

## 2018-07-21 LAB — CBC WITH DIFFERENTIAL/PLATELET
Abs Immature Granulocytes: 0 10*3/uL (ref 0.00–0.07)
Basophils Absolute: 0 10*3/uL (ref 0.0–0.1)
Basophils Relative: 1 %
EOS ABS: 0.1 10*3/uL (ref 0.0–0.5)
Eosinophils Relative: 4 %
HCT: 33.2 % — ABNORMAL LOW (ref 36.0–46.0)
Hemoglobin: 11.1 g/dL — ABNORMAL LOW (ref 12.0–15.0)
Immature Granulocytes: 0 %
LYMPHS ABS: 0.3 10*3/uL — AB (ref 0.7–4.0)
LYMPHS PCT: 19 %
MCH: 30.1 pg (ref 26.0–34.0)
MCHC: 33.4 g/dL (ref 30.0–36.0)
MCV: 90 fL (ref 80.0–100.0)
Monocytes Absolute: 0.2 10*3/uL (ref 0.1–1.0)
Monocytes Relative: 14 %
Neutro Abs: 0.9 10*3/uL — ABNORMAL LOW (ref 1.7–7.7)
Neutrophils Relative %: 62 %
Platelets: 73 10*3/uL — ABNORMAL LOW (ref 150–400)
RBC: 3.69 MIL/uL — ABNORMAL LOW (ref 3.87–5.11)
RDW: 13.7 % (ref 11.5–15.5)
WBC: 1.5 10*3/uL — ABNORMAL LOW (ref 4.0–10.5)
nRBC: 0 % (ref 0.0–0.2)

## 2018-07-21 LAB — URINALYSIS, COMPLETE (UACMP) WITH MICROSCOPIC
Bilirubin Urine: NEGATIVE
Glucose, UA: NEGATIVE mg/dL
Ketones, ur: NEGATIVE mg/dL
Leukocytes,Ua: NEGATIVE
Nitrite: NEGATIVE
Protein, ur: NEGATIVE mg/dL
Specific Gravity, Urine: 1.005 (ref 1.005–1.030)
pH: 6 (ref 5.0–8.0)

## 2018-07-21 LAB — COMPREHENSIVE METABOLIC PANEL
ALBUMIN: 3.6 g/dL (ref 3.5–5.0)
ALT: 20 U/L (ref 0–44)
AST: 21 U/L (ref 15–41)
Alkaline Phosphatase: 58 U/L (ref 38–126)
Anion gap: 7 (ref 5–15)
BUN: 12 mg/dL (ref 6–20)
CO2: 26 mmol/L (ref 22–32)
Calcium: 9.1 mg/dL (ref 8.9–10.3)
Chloride: 107 mmol/L (ref 98–111)
Creatinine, Ser: 0.76 mg/dL (ref 0.44–1.00)
GFR calc Af Amer: >60 mL/min (ref >60)
GFR calc non Af Amer: >60 mL/min (ref >60)
GLUCOSE: 88 mg/dL (ref 70–99)
Potassium: 4.1 mmol/L (ref 3.5–5.1)
Sodium: 140 mmol/L (ref 135–145)
Total Bilirubin: 0.3 mg/dL (ref 0.3–1.2)
Total Protein: 6.8 g/dL (ref 6.5–8.1)

## 2018-07-21 LAB — MAGNESIUM: Magnesium: 1.8 mg/dL (ref 1.7–2.4)

## 2018-07-21 NOTE — Assessment & Plan Note (Signed)
She has symptoms of intermittent dysuria Urine culture has been ordered We will call her with test results

## 2018-07-21 NOTE — Assessment & Plan Note (Signed)
This is due to bone marrow suppressive effect from recent chemo and radiation She does not need transfusion support She is not symptomatic We discussed neutropenic precaution Her platelet count is trending up I plan to recheck her blood work again in a month for further follow-up

## 2018-07-21 NOTE — Progress Notes (Signed)
[place] OFFICE PROGRESS NOTE  Patient Care Team: Lucretia Kern, DO as PCP - General (Family Medicine)  ASSESSMENT & PLAN:  Cervical cancer [facility]) She has completed radiation treatment today She has appointment to see radiation oncologist for further follow-up 1 month from now I will schedule her lab and flush appointment then I will schedule repeat blood work and PET CT scan in June. If her scans are normal, we will get the port removed In the meantime, we will continue supportive care  Pancytopenia, acquired ([place]) This is due to bone marrow suppressive effect from recent chemo and radiation She does not need transfusion support She is not symptomatic We discussed neutropenic precaution Her platelet count is trending up I plan to recheck her blood work again in a month for further follow-up  Dysuria She has symptoms of intermittent dysuria Urine culture has been ordered We will call her with test results   Orders Placed This Encounter  Procedures  . NM PET Image Restag (PS) Skull Base To Thigh    Standing Status:   Future    Standing Expiration Date:   07/21/2019    Order Specific Question:   If indicated for the ordered procedure, I authorize the administration of a radiopharmaceutical per Radiology protocol    Answer:   Yes    Order Specific Question:   Preferred imaging location?    Answer:   [facility]    Order Specific Question:   Radiology Contrast Protocol - do NOT remove file path    Answer:   [email or web address]    Order Specific Question:   ** REASON FOR EXAM (FREE TEXT)    Answer:   completed chemo radiation, assess for recurrence    Order Specific Question:   Is the patient pregnant?    Answer:   No    INTERVAL HISTORY: Please see below for problem oriented charting. She returns with her husband for further follow-up She has completed radiation treatment today She complained of some mild symptoms of  dysuria No fever or chills Denies nausea No recent diarrhea The patient denies any recent signs or symptoms of bleeding such as spontaneous epistaxis, hematuria or hematochezia. She complained of mild itchiness at the port area  SUMMARY OF ONCOLOGIC HISTORY:   Cervical cancer ([place])   02/20/2018 Initial Diagnosis    She presented with postmenopausal bleeding    03/10/2018 Pathology Results    Endometrium, biopsy - SQUAMOUS CELL CARCINOMA. - SEE COMMENT.    03/17/2018 Imaging    US pelvis: Endometrium measures 7 mm. In the setting of post-menopausal bleeding, endometrial sampling is indicated to exclude carcinoma    03/24/2018 Procedure    Pre-operative Diagnosis:  Suspect SCCa Cervix based on endometrial biopsy  Post-operative Diagnosis:  Stage IB1 (FIGO 2018) SCCa Cervix  Operation:  Exam under anesthesia Cervical biopsies  Operative Findings:  Anterior cervical lip with gross lesion from ~10-12:00. Remainder of cervix grossly normal. Estimate lesion to be <2cm. No vaginal or parametrial involvement.    03/30/2018 Imaging    CT scan of chest, abdomen and pelvis: 1. Isolated low-density structure lateral to the descending colon. Differential considerations include isolated peritoneal implant/metastasis, GI duplication/mesenteric cyst, or postoperative collection such as seroma (clinical history only describes prior Caesarean section). Consider further evaluation with PET. 2. Otherwise, no evidence of metastatic disease in the chest, abdomen, or pelvis.    04/04/2018 PET scan    1. Prominent hypermetabolism within the cervix, consistent with known cervical  cancer. No parametrial extension of tumor or abdominopelvic nodal metastatic disease. 2. The previously demonstrated low-density structure posterior to the descending colon demonstrates no hypermetabolic activity and is likely an incidental duplication/mesenteric cyst. 3. Focal hypermetabolic activity within a single right  neck lymph node (level 1B). This is unlikely to be related to the patient's cervical cancer. No hypermetabolism is seen within the pharyngeal mucosal space. ENT evaluation should be considered, especially if the patient has risk factors for head and neck malignancy.    04/28/2018 Surgery    Surgeon: Donaciano Eva  Pre-operative Diagnosis: stage IB2 cervical cancer  Post-operative Diagnosis: same  Operation: Robotic-assisted type III radical laparoscopic hysterectomy with bilateral salpingectomy and bilateral pelvic lymphadenectomy  Surgeon: Donaciano Eva  Operative Findings:  : 3.5cm tumor replacing the anterior and right cervix. No gross parametrial involvement.         Specimens: left and right pelvic nodes, uterus with cervix, bilateral tubes and upper vagina. Anterior vaginal margin with marking stitch at 12 o'clock of new true distal anterior vaginal margin         Complications:  None; patient tolerated the procedure well.             04/28/2018 Pathology Results    1. Lymph nodes, regional resection, right pelvic - EIGHT BENIGN LYMPH NODES (0/8). 2. Lymph nodes, regional resection, left pelvic - SEVEN BENIGN LYMPH NODES (0/7). 3. Uterus, cervix and bilateral fallopian tubes, upper vagina CERVIX: - INVASIVE POORLY DIFFERENTIATED SQUAMOUS CELL CARCINOMA, 4.5 X 2.6 X 1.0 CM. - MARGINS NOT INVOLVED. - LYMPHOVASCULAR INVOLVEMENT BY TUMOR. - METASTATIC CARCINOMA IN TWO OF SIX PARACERVICAL LYMPH NODES (2/6). BENIGN ENDOMETRIUM AND MYOMETRIUM SEE ONCOLOGY TABLE. 4. Vagina, biopsy, anterior vaginal margin - SQUAMOUS MUCOSA WITH SLIGHT INFLAMMATION. - NO EVIDENCE OF INVASIVE CARCINOMA. Microscopic Comment 3. UTERINE CERVIX: Resection  Procedure: Hysterectomy with right and left pelvic regional lymph nodes and anterior vaginal margin. Tumor Size: 4.5 x 2.6 x 1.0 cm. Histologic Type: Squamous cell carcinoma Histologic Grade: Poorly differentiated Stromal  Invasion: Yes Depth of stromal invasion (millimeters): 10 mm Horizontal extent longitudinal/length (if applicable#) (millimeters): 25 mm Horizontal extent circumferential/width (if applicable#) (millimeters): 45 mm Other Tissue/ Organ: No Margins: Free of tumor Lymphovascular: Present Regional Lymph Nodes: Two positive for tumor cells (select all that apply) Total Number of Lymph Nodes Examined: Twenty-one Number of Sentinel Nodes Examined (if applicable): 0 Pathologic Stage Classification (pTNM, AJCC 8th Edition): pT1b2, pN1 Ancillary Studies: N/A Representative Tumor Block: 3C, 3D, 3E, 31F, 3J and 3K Comment(s): The tumor is a poorly differentiated invasive squamous cell carcinoma which is up to 1.0 cm (10 mm) in thickness. There is lymphovascular space involvement within the wall of the cervix and in the paracervical connective tissue. The margins of the specimen are not involved by carcinoma (JDP:kh 04/29/18)    05/11/2018 Cancer Staging    Staging form: Cervix Uteri, AJCC 8th Edition - Pathologic stage from 05/11/2018: Stage III (pT3, pN1, cM0) - Signed by Heath Lark, MD on 05/11/2018    05/12/2018 Imaging    1. Soft tissue inflammation about the bladder could reflect cystitis. Would correlate with the patient's symptoms. 2. Small to moderate amount of free fluid within the pelvis, of uncertain significance. This may simply be postoperative in nature, though if urine output from the Foley catheter decreases, further evaluation could be considered to exclude urine leak.    05/29/2018 Procedure    Placement of a CT injectable subcutaneous port device.  06/12/2018 - 07/03/2018 Chemotherapy    She received weekly cisplatin with radiation x 4 doses. She is not able to receive any more chemo due to severe pancytopenia     REVIEW OF SYSTEMS:   Constitutional: Denies fevers, chills or abnormal weight loss Eyes: Denies blurriness of vision Ears, nose, mouth, throat, and face: Denies  mucositis or sore throat Respiratory: Denies cough, dyspnea or wheezes Cardiovascular: Denies palpitation, chest discomfort or lower extremity swelling Gastrointestinal:  Denies nausea, heartburn or change in bowel habits Lymphatics: Denies new lymphadenopathy or easy bruising Neurological:Denies numbness, tingling or new weaknesses Behavioral/Psych: Mood is stable, no new changes  All other systems were reviewed with the patient and are negative.  I have reviewed the past medical history, past surgical history, social history and family history with the patient and they are unchanged from previous note.  ALLERGIES:  has No Known Allergies.  MEDICATIONS:  Current Outpatient Medications  Medication Sig Dispense Refill  . Glucosamine-Chondroitin (MOVE FREE PO) Take 1 tablet by mouth daily.     . hydrocortisone (ANUSOL-HC) 2.5 % rectal cream Place 1 application rectally 2 (two) times daily. 30 g 0  . ibuprofen (ADVIL,MOTRIN) 600 MG tablet Take 1 tablet (600 mg total) by mouth every 6 (six) hours as needed for mild pain. 30 tablet 1  . lidocaine-prilocaine (EMLA) cream Apply to affected area once 30 g 3  . ondansetron (ZOFRAN) 8 MG tablet Take 1 tablet (8 mg total) by mouth every 8 (eight) hours as needed. Start on the third day after chemotherapy. 30 tablet 1  . prochlorperazine (COMPAZINE) 10 MG tablet Take 1 tablet (10 mg total) by mouth every 6 (six) hours as needed (Nausea or vomiting). 30 tablet 1  . senna-docusate (SENOKOT-S) 8.6-50 MG tablet Take 2 tablets by mouth at bedtime. Do not take if having diarrhea/loose stools 30 tablet 1   No current facility-administered medications for this visit.    Facility-Administered Medications Ordered in Other Visits  Medication Dose Route Frequency Provider Last Rate Last Dose  . heparin lock flush 100 unit/mL  500 Units Intracatheter Once Alvy Bimler, Elyza Whitt, MD      . sodium chloride flush (NS) 0.9 % injection 10 mL  10 mL Intracatheter Once Heath Lark, MD        PHYSICAL EXAMINATION: ECOG PERFORMANCE STATUS: 1 - Symptomatic but completely ambulatory  Vitals:   07/21/18 1052  BP: 110/71  Pulse: 70  Resp: 18  Temp: 98.3 F (36.8 C)  SpO2: 100%   Filed Weights   07/21/18 1052  Weight: 144 lb (65.3 kg)    GENERAL:alert, no distress and comfortable. SKIN: The area at the port site looks okay NEURO: alert & oriented x 3 with fluent speech, no focal motor/sensory deficits  LABORATORY DATA:  I have reviewed the data as listed    Component Value Date/Time   NA 141 07/15/2018 1011   K 4.1 07/15/2018 1011   CL 106 07/15/2018 1011   CO2 28 07/15/2018 1011   GLUCOSE 85 07/15/2018 1011   BUN 11 07/15/2018 1011   CREATININE 0.80 07/15/2018 1011   CALCIUM 9.2 07/15/2018 1011   PROT 6.7 07/15/2018 1011   ALBUMIN 3.7 07/15/2018 1011   AST 16 07/15/2018 1011   ALT 10 07/15/2018 1011   ALKPHOS 63 07/15/2018 1011   BILITOT 0.3 07/15/2018 1011   GFRNONAA >60 07/15/2018 1011   GFRAA >60 07/15/2018 1011    No results found for: SPEP, UPEP  Lab Results  Component  Value Date   WBC 1.5 (L) 07/21/2018   NEUTROABS 0.9 (L) 07/21/2018   HGB 11.1 (L) 07/21/2018   HCT 33.2 (L) 07/21/2018   MCV 90.0 07/21/2018   PLT 73 (L) 07/21/2018      Chemistry      Component Value Date/Time   NA 141 07/15/2018 1011   K 4.1 07/15/2018 1011   CL 106 07/15/2018 1011   CO2 28 07/15/2018 1011   BUN 11 07/15/2018 1011   CREATININE 0.80 07/15/2018 1011      Component Value Date/Time   CALCIUM 9.2 07/15/2018 1011   ALKPHOS 63 07/15/2018 1011   AST 16 07/15/2018 1011   ALT 10 07/15/2018 1011   BILITOT 0.3 07/15/2018 1011      All questions were answered. The patient knows to call the clinic with any problems, questions or concerns. No barriers to learning was detected.  I spent 15 minutes counseling the patient face to face. The total time spent in the appointment was 20 minutes and more than 50% was on counseling and review of test  results  Heath Lark, MD 07/21/2018 11:11 AM

## 2018-07-21 NOTE — Assessment & Plan Note (Signed)
She has completed radiation treatment today She has appointment to see radiation oncologist for further follow-up 1 month from now I will schedule her lab and flush appointment then I will schedule repeat blood work and PET CT scan in June. If her scans are normal, we will get the port removed In the meantime, we will continue supportive care

## 2018-07-22 ENCOUNTER — Encounter: Payer: Self-pay | Admitting: Radiation Oncology

## 2018-07-22 NOTE — Progress Notes (Signed)
  Radiation Oncology         [phone] ________________________________  Name: Holly Pena MRN: [id number]  Date: 07/22/2018  DOB: 1966-03-09  End of Treatment Note  Diagnosis:   Malignant neoplasm of endocervix      Indication for treatment:  Curative       Radiation treatment dates:   06/11/18-07/21/18  Site/dose:   1. pelvis; 25 fractions of 1.8 Gy for a total of 45 Gy           2. Boost; 3 fractions of 1.8 Gy for a total of 5.4 Gy  Beams/energy:   Photon IMRT; 6X  Narrative: The patient tolerated radiation treatment relatively well.     At the beginning of treatment, pt reported left sided abdominal pain and mild fatigue. Pt denied hematuria and vaginal/rectal bleeding throughout treatments. Towards the end of treatment, pt reported improvement to her pain but was experiencing some dysuria and frequency. Overall the pt was without complaints.   Plan: The patient has completed radiation treatment. The patient will return to radiation oncology clinic for routine followup in one month. I advised them to call or return sooner if they have any questions or concerns related to their recovery or treatment.  -----------------------------------  Blair Promise, PhD, MD  This document serves as a record of services personally performed by Gery Pray, MD. It was created on his behalf by Mary-Margaret Loma Messing, a trained medical scribe. The creation of this record is based on the scribe's personal observations and the provider's statements to them. This document has been checked and approved by the attending provider.

## 2018-08-07 ENCOUNTER — Encounter: Payer: Self-pay | Admitting: Gynecologic Oncology

## 2018-08-10 ENCOUNTER — Telehealth: Payer: Self-pay | Admitting: Physical Therapy

## 2018-08-10 ENCOUNTER — Encounter: Payer: Self-pay | Admitting: Physical Therapy

## 2018-08-10 ENCOUNTER — Telehealth: Payer: Self-pay

## 2018-08-10 NOTE — Telephone Encounter (Signed)
Received message from pt about returning  video for self manual lymph drainage. Pt did not answer her phone so left a message for pt that it  was fine for her to keep the video.  She is welcome to contact us later when we reopen and restrictions are lifted if she wants to return it or review the techniques again.  Maudry Diego, PT @TODAY @ 3:29 PM

## 2018-08-10 NOTE — Telephone Encounter (Signed)
Told Holly Pena that she would need to contact the [facility] HIM at [phone] to obtain those records. She does not need to call the [facility] HIM department as note in the My Chart message. Pt wrote information down and verbalized understanding.

## 2018-08-17 ENCOUNTER — Encounter: Payer: Self-pay | Admitting: Radiation Oncology

## 2018-08-17 ENCOUNTER — Encounter: Payer: Self-pay | Admitting: Hematology and Oncology

## 2018-08-19 ENCOUNTER — Encounter: Payer: Self-pay | Admitting: Radiation Oncology

## 2018-08-21 ENCOUNTER — Telehealth: Payer: Self-pay | Admitting: *Deleted

## 2018-08-21 NOTE — Telephone Encounter (Signed)
CALLED PATIENT TO ALTER FU APPT. FOR 08-24-18 DUE TO COVID 19, RESCHEDULED FOR 10-22-18 @ 10:30 AM , LVM FOR A RETURN CALL

## 2018-08-24 ENCOUNTER — Inpatient Hospital Stay: Payer: BLUE CROSS/BLUE SHIELD

## 2018-08-24 ENCOUNTER — Ambulatory Visit: Payer: PRIVATE HEALTH INSURANCE | Admitting: Radiation Oncology

## 2018-08-26 ENCOUNTER — Encounter: Payer: Self-pay | Admitting: General Practice

## 2018-08-26 NOTE — Progress Notes (Signed)
[place] [phone]  [place] Team contacted patient to assess for food insecurity and other psychosocial needs during current COVID19 pandemic.  No answer, left VM.  Beverely Pace, [place]

## 2018-09-04 ENCOUNTER — Encounter: Payer: Self-pay | Admitting: Hematology and Oncology

## 2018-10-02 ENCOUNTER — Encounter: Payer: Self-pay | Admitting: Hematology and Oncology

## 2018-10-06 ENCOUNTER — Encounter: Payer: Self-pay | Admitting: Oncology

## 2018-10-06 NOTE — Progress Notes (Signed)
Sent patient a mychart message to patient with PET scan information.

## 2018-10-07 ENCOUNTER — Encounter: Payer: Self-pay | Admitting: Oncology

## 2018-10-08 ENCOUNTER — Encounter: Payer: Self-pay | Admitting: Oncology

## 2018-10-21 ENCOUNTER — Other Ambulatory Visit: Payer: BLUE CROSS/BLUE SHIELD

## 2018-10-21 ENCOUNTER — Other Ambulatory Visit: Payer: Self-pay

## 2018-10-21 ENCOUNTER — Ambulatory Visit (HOSPITAL_COMMUNITY)
Admission: RE | Admit: 2018-10-21 | Discharge: 2018-10-21 | Disposition: A | Discharge status: Home / Self Care | Payer: BLUE CROSS/BLUE SHIELD | Source: Ambulatory Visit | Attending: Hematology and Oncology | Admitting: Hematology and Oncology

## 2018-10-21 ENCOUNTER — Inpatient Hospital Stay: Payer: PRIVATE HEALTH INSURANCE | Attending: Obstetrics

## 2018-10-21 DIAGNOSIS — Z79899 Other long term (current) drug therapy: Secondary | ICD-10-CM | POA: Insufficient documentation

## 2018-10-21 DIAGNOSIS — R590 Localized enlarged lymph nodes: Secondary | ICD-10-CM | POA: Insufficient documentation

## 2018-10-21 DIAGNOSIS — C531 Malignant neoplasm of exocervix: Secondary | ICD-10-CM | POA: Diagnosis present

## 2018-10-21 DIAGNOSIS — R339 Retention of urine, unspecified: Secondary | ICD-10-CM | POA: Insufficient documentation

## 2018-10-21 DIAGNOSIS — R9389 Abnormal findings on diagnostic imaging of other specified body structures: Secondary | ICD-10-CM | POA: Diagnosis not present

## 2018-10-21 DIAGNOSIS — D61818 Other pancytopenia: Secondary | ICD-10-CM | POA: Insufficient documentation

## 2018-10-21 DIAGNOSIS — Z791 Long term (current) use of non-steroidal anti-inflammatories (NSAID): Secondary | ICD-10-CM | POA: Insufficient documentation

## 2018-10-21 DIAGNOSIS — C530 Malignant neoplasm of endocervix: Secondary | ICD-10-CM | POA: Diagnosis not present

## 2018-10-21 DIAGNOSIS — R3915 Urgency of urination: Secondary | ICD-10-CM | POA: Diagnosis not present

## 2018-10-21 DIAGNOSIS — Z923 Personal history of irradiation: Secondary | ICD-10-CM | POA: Diagnosis not present

## 2018-10-21 LAB — COMPREHENSIVE METABOLIC PANEL
ALT: 25 U/L (ref 0–44)
AST: 26 U/L (ref 15–41)
Albumin: 4 g/dL (ref 3.5–5.0)
Alkaline Phosphatase: 53 U/L (ref 38–126)
Anion gap: 8 (ref 5–15)
BUN: 11 mg/dL (ref 6–20)
CO2: 25 mmol/L (ref 22–32)
Calcium: 9.3 mg/dL (ref 8.9–10.3)
Chloride: 108 mmol/L (ref 98–111)
Creatinine, Ser: 0.83 mg/dL (ref 0.44–1.00)
GFR calc Af Amer: >60 mL/min (ref >60)
GFR calc non Af Amer: >60 mL/min (ref >60)
Glucose, Bld: 85 mg/dL (ref 70–99)
Potassium: 4.5 mmol/L (ref 3.5–5.1)
Sodium: 141 mmol/L (ref 135–145)
Total Bilirubin: 0.6 mg/dL (ref 0.3–1.2)
Total Protein: 6.7 g/dL (ref 6.5–8.1)

## 2018-10-21 LAB — CBC WITH DIFFERENTIAL/PLATELET
Abs Immature Granulocytes: 0.01 10*3/uL (ref 0.00–0.07)
Basophils Absolute: 0 10*3/uL (ref 0.0–0.1)
Basophils Relative: 1 %
Eosinophils Absolute: 0.2 10*3/uL (ref 0.0–0.5)
Eosinophils Relative: 7 %
HCT: 36.2 % (ref 36.0–46.0)
Hemoglobin: 11.9 g/dL — ABNORMAL LOW (ref 12.0–15.0)
Immature Granulocytes: 0 %
Lymphocytes Relative: 25 %
Lymphs Abs: 0.6 10*3/uL — ABNORMAL LOW (ref 0.7–4.0)
MCH: 30.7 pg (ref 26.0–34.0)
MCHC: 32.9 g/dL (ref 30.0–36.0)
MCV: 93.5 fL (ref 80.0–100.0)
Monocytes Absolute: 0.2 10*3/uL (ref 0.1–1.0)
Monocytes Relative: 9 %
Neutro Abs: 1.5 10*3/uL — ABNORMAL LOW (ref 1.7–7.7)
Neutrophils Relative %: 58 %
Platelets: 154 10*3/uL (ref 150–400)
RBC: 3.87 MIL/uL (ref 3.87–5.11)
RDW: 11.8 % (ref 11.5–15.5)
WBC: 2.6 10*3/uL — ABNORMAL LOW (ref 4.0–10.5)
nRBC: 0 % (ref 0.0–0.2)

## 2018-10-21 LAB — MAGNESIUM: Magnesium: 2 mg/dL (ref 1.7–2.4)

## 2018-10-21 LAB — GLUCOSE, CAPILLARY: Glucose-Capillary: 85 mg/dL (ref 70–99)

## 2018-10-21 MED ORDER — FLUDEOXYGLUCOSE F - 18 (FDG) INJECTION
7.2000 | Freq: Once | INTRAVENOUS | Status: AC | PRN
  Administered 2018-10-21: 7.2 via INTRAVENOUS

## 2018-10-22 ENCOUNTER — Telehealth: Payer: Self-pay | Admitting: Oncology

## 2018-10-22 ENCOUNTER — Encounter: Payer: Self-pay | Admitting: Radiation Oncology

## 2018-10-22 ENCOUNTER — Encounter: Payer: Self-pay | Admitting: Oncology

## 2018-10-22 ENCOUNTER — Encounter: Payer: Self-pay | Admitting: Hematology and Oncology

## 2018-10-22 ENCOUNTER — Other Ambulatory Visit: Payer: Self-pay

## 2018-10-22 ENCOUNTER — Inpatient Hospital Stay (HOSPITAL_BASED_OUTPATIENT_CLINIC_OR_DEPARTMENT_OTHER): Payer: PRIVATE HEALTH INSURANCE | Admitting: Hematology and Oncology

## 2018-10-22 ENCOUNTER — Ambulatory Visit
Admission: RE | Admit: 2018-10-22 | Discharge: 2018-10-22 | Disposition: A | Discharge status: Home / Self Care | Payer: BLUE CROSS/BLUE SHIELD | Source: Ambulatory Visit | Attending: Radiation Oncology | Admitting: Radiation Oncology

## 2018-10-22 VITALS — BP 122/85 | HR 61 | Temp 99.1°F | Ht 63.0 in | Wt 144.8 lb

## 2018-10-22 DIAGNOSIS — Z923 Personal history of irradiation: Secondary | ICD-10-CM | POA: Insufficient documentation

## 2018-10-22 DIAGNOSIS — C530 Malignant neoplasm of endocervix: Secondary | ICD-10-CM | POA: Insufficient documentation

## 2018-10-22 DIAGNOSIS — D61818 Other pancytopenia: Secondary | ICD-10-CM

## 2018-10-22 DIAGNOSIS — R9389 Abnormal findings on diagnostic imaging of other specified body structures: Secondary | ICD-10-CM | POA: Insufficient documentation

## 2018-10-22 DIAGNOSIS — Z791 Long term (current) use of non-steroidal anti-inflammatories (NSAID): Secondary | ICD-10-CM

## 2018-10-22 DIAGNOSIS — Z79899 Other long term (current) drug therapy: Secondary | ICD-10-CM

## 2018-10-22 DIAGNOSIS — C531 Malignant neoplasm of exocervix: Secondary | ICD-10-CM | POA: Diagnosis not present

## 2018-10-22 DIAGNOSIS — R339 Retention of urine, unspecified: Secondary | ICD-10-CM

## 2018-10-22 DIAGNOSIS — R3915 Urgency of urination: Secondary | ICD-10-CM | POA: Insufficient documentation

## 2018-10-22 DIAGNOSIS — R591 Generalized enlarged lymph nodes: Secondary | ICD-10-CM | POA: Diagnosis not present

## 2018-10-22 NOTE — Assessment & Plan Note (Signed)
I do not believe the lymphadenopathy is related to her cervical cancer She has poor oral dentition Just to be sure, I will refer her to ENT for further follow-up

## 2018-10-22 NOTE — Assessment & Plan Note (Signed)
Her anemia has almost completely resolved Leukopenia is improving and I expect it to return back to normal in a few months. She will continue with primary care doctor follow-up in a few months to recheck CBC

## 2018-10-22 NOTE — Progress Notes (Signed)
Pt presents today for f/u with Dr. Sondra Come. Pt denies c/o pain. Pt reports occasional itching in peri area due to hair growth. Pt denies dysuria/hematuria. Pt denies vaginal bleeding/discharge. Pt denies rectal bleeding, diarrhea/constipation. Pt reports good PET results per Dr. Alvy Bimler.  BP 122/85 (BP Location: Left Arm, Patient Position: Sitting)   Pulse 61   Temp 99.1 F (37.3 C) (Oral)   Ht 5\' 3"  (1.6 m)   Wt 144 lb 12.8 oz (65.7 kg)   LMP 02/20/2018 Comment: no period in 2 years  SpO2 100%   BMI 25.65 kg/m   Wt Readings from Last 3 Encounters:  10/22/18 144 lb 12.8 oz (65.7 kg)  10/22/18 145 lb 3.2 oz (65.9 kg)  07/21/18 144 lb (65.3 kg)   Loma Sousa, RN BSN

## 2018-10-22 NOTE — Assessment & Plan Note (Signed)
I have reviewed PET CT scan with the patient I do not believe the lymphadenopathy in the neck is related to her cancer She has poor dentition and likely has reactive lymphadenopathy in that region I recommend her to call her dentist for checkup and I will refer her to ENT just to be sure She will continue future follow-up with GYN oncologist and radiation oncologist

## 2018-10-22 NOTE — Telephone Encounter (Signed)
Called Dr. Audelia Acton office and scheduled appointment for 11/06/18 at 3:30 pm.  Faxed records to [phone].  Sent MyChart message to patient with appointment details.

## 2018-10-22 NOTE — Assessment & Plan Note (Signed)
She denies urinary retention She has no signs or symptoms to suggest urinary tract infection

## 2018-10-22 NOTE — Progress Notes (Signed)
Radiation Oncology         [phone] ________________________________  Name: Holly Pena MRN: [id number]  Date: 10/22/2018  DOB: Aug 08, 1965  Follow-Up Visit Note  CC: Lucretia Kern, DO  Everitt Amber, MD    ICD-10-CM   1. Malignant neoplasm of endocervix [facility])  C53.0     Diagnosis:   53 y.o. female with Stage III-C(pT1b2, pN1)poorly differentiated squamous cell carcinoma of the cervix  Interval Since Last Radiation:  13 weeks  Radiation treatment dates:   06/11/18-07/21/18 Site/dose:   1. pelvis / 25 fractions x 1.8 Gy for a total of 45 Gy                      2. Central pelvis Boost / 3 fractions x 1.8 Gy for a total of 5.4 Gy  Narrative:  The patient returns today for routine follow-up.  She underwent re-staging PET scan yesterday, 10/21/2018, which did not show any findings to suggest residual or recurrent FDG avid tumor within the pelvis. No evidence for abdominopelvic nodal metastasis. There is persistence of focal hypermetabolic activity within sub-centimeter right level IB lymph node. No hypermetabolism is seen within the pharyngeal mucosal space.   On review of systems, the patient denies any pain. She denies dysuria or hematuria. She denies vaginal bleeding or discharge. She denies rectal bleeding, diarrhea, or constipation.  She notices continued urinary urgency but no problems with incontinence.                      ALLERGIES:  has No Known Allergies.  Meds: Current Outpatient Medications  Medication Sig Dispense Refill  . Glucosamine-Chondroitin (MOVE FREE PO) Take 1 tablet by mouth daily.     . hydrocortisone (ANUSOL-HC) 2.5 % rectal cream Place 1 application rectally 2 (two) times daily. 30 g 0  . ibuprofen (ADVIL,MOTRIN) 600 MG tablet Take 1 tablet (600 mg total) by mouth every 6 (six) hours as needed for mild pain. 30 tablet 1  . lidocaine-prilocaine (EMLA) cream Apply to affected area once 30 g 3  . ondansetron (ZOFRAN) 8 MG tablet Take 1 tablet (8 mg total) by mouth  every 8 (eight) hours as needed. Start on the third day after chemotherapy. 30 tablet 1  . prochlorperazine (COMPAZINE) 10 MG tablet Take 1 tablet (10 mg total) by mouth every 6 (six) hours as needed (Nausea or vomiting). 30 tablet 1  . senna-docusate (SENOKOT-S) 8.6-50 MG tablet Take 2 tablets by mouth at bedtime. Do not take if having diarrhea/loose stools 30 tablet 1   No current facility-administered medications for this encounter.     Physical Findings: The patient is in no acute distress. Patient is alert and oriented.  height is 5\' 3"  (1.6 m) and weight is 144 lb 12.8 oz (65.7 kg). Her oral temperature is 99.1 F (37.3 C). Her blood pressure is 122/85 and her pulse is 61. Her oxygen saturation is 100%.   Lungs are clear to auscultation bilaterally. Heart has regular rate and rhythm. No palpable cervical, supraclavicular, or axillary adenopathy. Abdomen soft, non-tender, normal bowel sounds.  On pelvic examination the external genitalia were unremarkable. No palpable inguinal adenopathy. A speculum exam was performed. Speculum exam was only possible with small speculum. The exam was very uncomfortable for the patient. There are no mucosal lesions noted in the vaginal vault. On bimanual examination there were no pelvic masses appreciated. Patient would not tolerate a rectal exam today.   Lab Findings: Lab  Results  Component Value Date   WBC 2.6 (L) 10/21/2018   HGB 11.9 (L) 10/21/2018   HCT 36.2 10/21/2018   MCV 93.5 10/21/2018   PLT 154 10/21/2018    Radiographic Findings: Nm Pet Image Restag (ps) Skull Base To Thigh  Result Date: 10/21/2018 CLINICAL DATA:  Subsequent treatment strategy for cervical cancer. EXAM: NUCLEAR MEDICINE PET SKULL BASE TO THIGH TECHNIQUE: 7.2 mCi F-18 FDG was injected intravenously. Full-ring PET imaging was performed from the skull base to thigh after the radiotracer. CT data was obtained and used for attenuation correction and anatomic localization.  Fasting blood glucose: 85 mg/dl COMPARISON:  04/03/2018 FINDINGS: Mediastinal blood pool activity: SUV max 2.4 Liver activity: SUV max NA NECK: Previous right submental lymph node is again noted measuring 0.8 cm within SUV max of 6.13. On the prior exam this measured 8 mm within SUV max of 5.2. Small right posterior triangle lymph node measures 6 mm and has an SUV max of 2.5. Previously this measured 6 mm within SUV max of 1.28. Incidental CT findings: none CHEST: No FDG avid axillary or supraclavicular lymph nodes. No abnormal uptake within mediastinum or hilar regions. No hypermetabolic pulmonary nodule or mass. Incidental CT findings: none ABDOMEN/PELVIS: No abnormal radiotracer activity within the liver, pancreas, spleen, or adrenal glands. Incidental CT findings: Status post hysterectomy. No residual or recurrent tumor FDG avid identified. SKELETON: No focal hypermetabolic activity to suggest skeletal metastasis. Incidental CT findings: none IMPRESSION: 1. Status post hysterectomy. No findings identified to suggest residual or recurrent FDG avid tumor within the pelvis. No evidence for abdominopelvic nodal metastasis. 2. Persistence of focal hypermetabolic activity within subcentimeter right level 1B lymph node. No hypermetabolism is seen within the pharyngeal mucosal space. ENT evaluation should be considered, especially if the patient has risk factors for head and neck malignancy. Electronically Signed   By: Kerby Moors M.D.   On: 10/21/2018 12:43    Impression:  Stage III-C(pT1b2, pN1)poorly differentiated squamous cell carcinoma of the cervix. The patient is recovering from the effects of radiation.  No evidence of recurrence on recent PET scan or clinical exam today. Patient's PET scan did show activity in one lymph node in the head/neck region. Patient is being referred to ENT by [facility] for evaluation of this issue. This area was on the patient's previous PET scan but has not changed  significantly. Today the patient was given XtraS and S dilators with instructions. One of our physicists helped with interpretation today through the Mandarin language.  Plan:  Routine follow-up in 6 months. Patient will follow up with Dr. Denman George in 3 months. Discussed that Pap smear will be performed once a year.   ____________________________________  Blair Promise, PhD, MD  This document serves as a record of services personally performed by Gery Pray, MD. It was created on his behalf by Rae Lips, a trained medical scribe. The creation of this record is based on the scribe's personal observations and the provider's statements to them. This document has been checked and approved by the attending provider.

## 2018-10-22 NOTE — Progress Notes (Signed)
[facility] OFFICE PROGRESS NOTE  Patient Care Team: Lucretia Kern, DO as PCP - General (Family Medicine)  ASSESSMENT & PLAN:  Cervical cancer [facility]) I have reviewed PET CT scan with the patient I do not believe the lymphadenopathy in the neck is related to her cancer She has poor dentition and likely has reactive lymphadenopathy in that region I recommend her to call her dentist for checkup and I will refer her to ENT just to be sure She will continue future follow-up with GYN oncologist and radiation oncologist  Lymphadenopathy of head and neck I do not believe the lymphadenopathy is related to her cervical cancer She has poor oral dentition Just to be sure, I will refer her to ENT for further follow-up  Pancytopenia, acquired ([place]) Her anemia has almost completely resolved Leukopenia is improving and I expect it to return back to normal in a few months. She will continue with primary care doctor follow-up in a few months to recheck CBC  Urinary retention with incomplete bladder emptying She denies urinary retention She has no signs or symptoms to suggest urinary tract infection   Orders Placed This Encounter  Procedures  . IR REMOVAL TUN ACCESS W/ PORT W/O FL MOD SED    Standing Status:   Future    Standing Expiration Date:   12/22/2019    Order Specific Question:   Reason for exam:    Answer:   chemo completed, ok to remove port    Order Specific Question:   Preferred Imaging Location?    Answer:   [facility]  . Ambulatory referral to ENT    Referral Priority:   Routine    Referral Type:   Consultation    Referral Reason:   Specialty Services Required    Requested Specialty:   Otolaryngology    Number of Visits Requested:   1    INTERVAL HISTORY: Please see below for problem oriented charting. She returns for evaluation and review test results Since last time I saw her, she feels well Denies abnormal vaginal bleeding Most of the  complications from chemotherapy has almost resolved She had no urinary retention She has not seen a dentist for a long time Denies sore throat or swallowing difficulties No recent infection, fever or chills  SUMMARY OF ONCOLOGIC HISTORY: Oncology History  Cervical cancer ([place])  02/20/2018 Initial Diagnosis   She presented with postmenopausal bleeding   03/10/2018 Pathology Results   Endometrium, biopsy - SQUAMOUS CELL CARCINOMA. - SEE COMMENT.   03/17/2018 Imaging   US pelvis: Endometrium measures 7 mm. In the setting of post-menopausal bleeding, endometrial sampling is indicated to exclude carcinoma   03/24/2018 Procedure   Pre-operative Diagnosis:  Suspect SCCa Cervix based on endometrial biopsy  Post-operative Diagnosis:  Stage IB1 (FIGO 2018) SCCa Cervix  Operation:  Exam under anesthesia Cervical biopsies  Operative Findings:  Anterior cervical lip with gross lesion from ~10-12:00. Remainder of cervix grossly normal. Estimate lesion to be <2cm. No vaginal or parametrial involvement.   03/30/2018 Imaging   CT scan of chest, abdomen and pelvis: 1. Isolated low-density structure lateral to the descending colon. Differential considerations include isolated peritoneal implant/metastasis, GI duplication/mesenteric cyst, or postoperative collection such as seroma (clinical history only describes prior Caesarean section). Consider further evaluation with PET. 2. Otherwise, no evidence of metastatic disease in the chest, abdomen, or pelvis.   04/04/2018 PET scan   1. Prominent hypermetabolism within the cervix, consistent with known cervical cancer. No parametrial  extension of tumor or abdominopelvic nodal metastatic disease. 2. The previously demonstrated low-density structure posterior to the descending colon demonstrates no hypermetabolic activity and is likely an incidental duplication/mesenteric cyst. 3. Focal hypermetabolic activity within a single right neck lymph node  (level 1B). This is unlikely to be related to the patient's cervical cancer. No hypermetabolism is seen within the pharyngeal mucosal space. ENT evaluation should be considered, especially if the patient has risk factors for head and neck malignancy.   04/28/2018 Surgery   Surgeon: Donaciano Eva  Pre-operative Diagnosis: stage IB2 cervical cancer  Post-operative Diagnosis: same  Operation: Robotic-assisted type III radical laparoscopic hysterectomy with bilateral salpingectomy and bilateral pelvic lymphadenectomy  Surgeon: Donaciano Eva  Operative Findings:  : 3.5cm tumor replacing the anterior and right cervix. No gross parametrial involvement.         Specimens: left and right pelvic nodes, uterus with cervix, bilateral tubes and upper vagina. Anterior vaginal margin with marking stitch at 12 o'clock of new true distal anterior vaginal margin         Complications:  None; patient tolerated the procedure well.            04/28/2018 Pathology Results   1. Lymph nodes, regional resection, right pelvic - EIGHT BENIGN LYMPH NODES (0/8). 2. Lymph nodes, regional resection, left pelvic - SEVEN BENIGN LYMPH NODES (0/7). 3. Uterus, cervix and bilateral fallopian tubes, upper vagina CERVIX: - INVASIVE POORLY DIFFERENTIATED SQUAMOUS CELL CARCINOMA, 4.5 X 2.6 X 1.0 CM. - MARGINS NOT INVOLVED. - LYMPHOVASCULAR INVOLVEMENT BY TUMOR. - METASTATIC CARCINOMA IN TWO OF SIX PARACERVICAL LYMPH NODES (2/6). BENIGN ENDOMETRIUM AND MYOMETRIUM SEE ONCOLOGY TABLE. 4. Vagina, biopsy, anterior vaginal margin - SQUAMOUS MUCOSA WITH SLIGHT INFLAMMATION. - NO EVIDENCE OF INVASIVE CARCINOMA. Microscopic Comment 3. UTERINE CERVIX: Resection  Procedure: Hysterectomy with right and left pelvic regional lymph nodes and anterior vaginal margin. Tumor Size: 4.5 x 2.6 x 1.0 cm. Histologic Type: Squamous cell carcinoma Histologic Grade: Poorly differentiated Stromal Invasion: Yes Depth of  stromal invasion (millimeters): 10 mm Horizontal extent longitudinal/length (if applicable#) (millimeters): 25 mm Horizontal extent circumferential/width (if applicable#) (millimeters): 45 mm Other Tissue/ Organ: No Margins: Free of tumor Lymphovascular: Present Regional Lymph Nodes: Two positive for tumor cells (select all that apply) Total Number of Lymph Nodes Examined: Twenty-one Number of Sentinel Nodes Examined (if applicable): 0 Pathologic Stage Classification (pTNM, AJCC 8th Edition): pT1b2, pN1 Ancillary Studies: N/A Representative Tumor Block: 3C, 3D, 3E, 61F, 3J and 3K Comment(s): The tumor is a poorly differentiated invasive squamous cell carcinoma which is up to 1.0 cm (10 mm) in thickness. There is lymphovascular space involvement within the wall of the cervix and in the paracervical connective tissue. The margins of the specimen are not involved by carcinoma (JDP:kh 04/29/18)   05/11/2018 Cancer Staging   Staging form: Cervix Uteri, AJCC 8th Edition - Pathologic stage from 05/11/2018: Stage III (pT3, pN1, cM0) - Signed by Heath Lark, MD on 05/11/2018   05/12/2018 Imaging   1. Soft tissue inflammation about the bladder could reflect cystitis. Would correlate with the patient's symptoms. 2. Small to moderate amount of free fluid within the pelvis, of uncertain significance. This may simply be postoperative in nature, though if urine output from the Foley catheter decreases, further evaluation could be considered to exclude urine leak.   05/29/2018 Procedure   Placement of a CT injectable subcutaneous port device.   06/12/2018 - 07/03/2018 Chemotherapy   She received weekly cisplatin with radiation x 4  doses. She is not able to receive any more chemo due to severe pancytopenia   10/21/2018 PET scan   1. Status post hysterectomy. No findings identified to suggest residual or recurrent FDG avid tumor within the pelvis. No evidence for abdominopelvic nodal metastasis. 2.  Persistence of focal hypermetabolic activity within subcentimeter right level 1B lymph node. No hypermetabolism is seen within the pharyngeal mucosal space. ENT evaluation should be considered, especially if the patient has risk factors for head and neck malignancy.     REVIEW OF SYSTEMS:   Constitutional: Denies fevers, chills or abnormal weight loss Eyes: Denies blurriness of vision Ears, nose, mouth, throat, and face: Denies mucositis or sore throat Respiratory: Denies cough, dyspnea or wheezes Cardiovascular: Denies palpitation, chest discomfort or lower extremity swelling Gastrointestinal:  Denies nausea, heartburn or change in bowel habits Skin: Denies abnormal skin rashes Lymphatics: Denies new lymphadenopathy or easy bruising Neurological:Denies numbness, tingling or new weaknesses Behavioral/Psych: Mood is stable, no new changes  All other systems were reviewed with the patient and are negative.  I have reviewed the past medical history, past surgical history, social history and family history with the patient and they are unchanged from previous note.  ALLERGIES:  has No Known Allergies.  MEDICATIONS:  Current Outpatient Medications  Medication Sig Dispense Refill  . Glucosamine-Chondroitin (MOVE FREE PO) Take 1 tablet by mouth daily.     . hydrocortisone (ANUSOL-HC) 2.5 % rectal cream Place 1 application rectally 2 (two) times daily. 30 g 0  . ibuprofen (ADVIL,MOTRIN) 600 MG tablet Take 1 tablet (600 mg total) by mouth every 6 (six) hours as needed for mild pain. 30 tablet 1  . lidocaine-prilocaine (EMLA) cream Apply to affected area once 30 g 3  . ondansetron (ZOFRAN) 8 MG tablet Take 1 tablet (8 mg total) by mouth every 8 (eight) hours as needed. Start on the third day after chemotherapy. 30 tablet 1  . prochlorperazine (COMPAZINE) 10 MG tablet Take 1 tablet (10 mg total) by mouth every 6 (six) hours as needed (Nausea or vomiting). 30 tablet 1  . senna-docusate (SENOKOT-S)  8.6-50 MG tablet Take 2 tablets by mouth at bedtime. Do not take if having diarrhea/loose stools 30 tablet 1   No current facility-administered medications for this visit.     PHYSICAL EXAMINATION: ECOG PERFORMANCE STATUS: 0 - Asymptomatic  Vitals:   10/22/18 1008  BP: (!) 110/91  Pulse: 64  Resp: 18  Temp: 99.1 F (37.3 C)  SpO2: 100%   Filed Weights   10/22/18 1008  Weight: 145 lb 3.2 oz (65.9 kg)    GENERAL:alert, no distress and comfortable SKIN: skin color, texture, turgor are normal, no rashes or significant lesions EYES: normal, Conjunctiva are pink and non-injected, sclera clear OROPHARYNX:no exudate, no erythema and lips, buccal mucosa, and tongue normal  NECK: supple, thyroid normal size, non-tender, without nodularity LYMPH:  no palpable lymphadenopathy in the cervical, axillary or inguinal LUNGS: clear to auscultation and percussion with normal breathing effort HEART: regular rate & rhythm and no murmurs and no lower extremity edema ABDOMEN:abdomen soft, non-tender and normal bowel sounds Musculoskeletal:no cyanosis of digits and no clubbing  NEURO: alert & oriented x 3 with fluent speech, no focal motor/sensory deficits  LABORATORY DATA:  I have reviewed the data as listed    Component Value Date/Time   NA 141 10/21/2018 0934   K 4.5 10/21/2018 0934   CL 108 10/21/2018 0934   CO2 25 10/21/2018 0934   GLUCOSE 85 10/21/2018  0934   BUN 11 10/21/2018 0934   CREATININE 0.83 10/21/2018 0934   CALCIUM 9.3 10/21/2018 0934   PROT 6.7 10/21/2018 0934   ALBUMIN 4.0 10/21/2018 0934   AST 26 10/21/2018 0934   ALT 25 10/21/2018 0934   ALKPHOS 53 10/21/2018 0934   BILITOT 0.6 10/21/2018 0934   GFRNONAA >60 10/21/2018 0934   GFRAA >60 10/21/2018 0934    No results found for: SPEP, UPEP  Lab Results  Component Value Date   WBC 2.6 (L) 10/21/2018   NEUTROABS 1.5 (L) 10/21/2018   HGB 11.9 (L) 10/21/2018   HCT 36.2 10/21/2018   MCV 93.5 10/21/2018   PLT 154  10/21/2018      Chemistry      Component Value Date/Time   NA 141 10/21/2018 0934   K 4.5 10/21/2018 0934   CL 108 10/21/2018 0934   CO2 25 10/21/2018 0934   BUN 11 10/21/2018 0934   CREATININE 0.83 10/21/2018 0934      Component Value Date/Time   CALCIUM 9.3 10/21/2018 0934   ALKPHOS 53 10/21/2018 0934   AST 26 10/21/2018 0934   ALT 25 10/21/2018 0934   BILITOT 0.6 10/21/2018 0934       RADIOGRAPHIC STUDIES: I have reviewed imaging study with the patient I have personally reviewed the radiological images as listed and agreed with the findings in the report. Nm Pet Image Restag (ps) Skull Base To Thigh  Result Date: 10/21/2018 CLINICAL DATA:  Subsequent treatment strategy for cervical cancer. EXAM: NUCLEAR MEDICINE PET SKULL BASE TO THIGH TECHNIQUE: 7.2 mCi F-18 FDG was injected intravenously. Full-ring PET imaging was performed from the skull base to thigh after the radiotracer. CT data was obtained and used for attenuation correction and anatomic localization. Fasting blood glucose: 85 mg/dl COMPARISON:  04/03/2018 FINDINGS: Mediastinal blood pool activity: SUV max 2.4 Liver activity: SUV max NA NECK: Previous right submental lymph node is again noted measuring 0.8 cm within SUV max of 6.13. On the prior exam this measured 8 mm within SUV max of 5.2. Small right posterior triangle lymph node measures 6 mm and has an SUV max of 2.5. Previously this measured 6 mm within SUV max of 1.28. Incidental CT findings: none CHEST: No FDG avid axillary or supraclavicular lymph nodes. No abnormal uptake within mediastinum or hilar regions. No hypermetabolic pulmonary nodule or mass. Incidental CT findings: none ABDOMEN/PELVIS: No abnormal radiotracer activity within the liver, pancreas, spleen, or adrenal glands. Incidental CT findings: Status post hysterectomy. No residual or recurrent tumor FDG avid identified. SKELETON: No focal hypermetabolic activity to suggest skeletal metastasis. Incidental CT  findings: none IMPRESSION: 1. Status post hysterectomy. No findings identified to suggest residual or recurrent FDG avid tumor within the pelvis. No evidence for abdominopelvic nodal metastasis. 2. Persistence of focal hypermetabolic activity within subcentimeter right level 1B lymph node. No hypermetabolism is seen within the pharyngeal mucosal space. ENT evaluation should be considered, especially if the patient has risk factors for head and neck malignancy. Electronically Signed   By: Kerby Moors M.D.   On: 10/21/2018 12:43    All questions were answered. The patient knows to call the clinic with any problems, questions or concerns. No barriers to learning was detected.  I spent 15 minutes counseling the patient face to face. The total time spent in the appointment was 20 minutes and more than 50% was on counseling and review of test results  Heath Lark, MD 10/22/2018 10:18 AM

## 2018-10-22 NOTE — Patient Instructions (Signed)
Coronavirus (COVID-19) Are you at risk?  Are you at risk for the Coronavirus (COVID-19)?  To be considered HIGH RISK for Coronavirus (COVID-19), you have to meet the following criteria:  . Traveled to [place], [place], [place], [place] or [place]; or in the [place] to [place], [place], [place], or [place]; and have fever, cough, and shortness of breath within the last 2 weeks of travel OR . Been in close contact with a person diagnosed with COVID-19 within the last 2 weeks and have fever, cough, and shortness of breath . IF YOU DO NOT MEET THESE CRITERIA, YOU ARE CONSIDERED LOW RISK FOR COVID-19.  What to do if you are HIGH RISK for COVID-19?  . If you are having a medical emergency, call 911. . Seek medical care right away. Before you go to a doctor's office, urgent care or emergency department, call ahead and tell them about your recent travel, contact with someone diagnosed with COVID-19, and your symptoms. You should receive instructions from your physician's office regarding next steps of care.  . When you arrive at healthcare provider, tell the healthcare staff immediately you have returned from visiting [place], [place], [place], [place] or [place]; or traveled in the [place] to [place], [place], [place], or [place]; in the last two weeks or you have been in close contact with a person diagnosed with COVID-19 in the last 2 weeks.   . Tell the health care staff about your symptoms: fever, cough and shortness of breath. . After you have been seen by a medical provider, you will be either: o Tested for (COVID-19) and discharged home on quarantine except to seek medical care if symptoms worsen, and asked to  - Stay home and avoid contact with others until you get your results (4-5 days)  - Avoid travel on public transportation if possible (such as bus, train, or airplane) or o Sent to the Emergency Department by EMS for evaluation, COVID-19 testing, and possible  admission depending on your condition and test results.  What to do if you are LOW RISK for COVID-19?  Reduce your risk of any infection by using the same precautions used for avoiding the common cold or flu:  . Wash your hands often with soap and warm water for at least 20 seconds.  If soap and water are not readily available, use an alcohol-based hand sanitizer with at least 60% alcohol.  . If coughing or sneezing, cover your mouth and nose by coughing or sneezing into the elbow areas of your shirt or coat, into a tissue or into your sleeve (not your hands). . Avoid shaking hands with others and consider head nods or verbal greetings only. . Avoid touching your eyes, nose, or mouth with unwashed hands.  . Avoid close contact with people who are sick. . Avoid places or events with large numbers of people in one location, like concerts or sporting events. . Carefully consider travel plans you have or are making. . If you are planning any travel outside or inside the [place], visit the CDC's Travelers' Health webpage for the latest health notices. . If you have some symptoms but not all symptoms, continue to monitor at home and seek medical attention if your symptoms worsen. . If you are having a medical emergency, call 911.   ADDITIONAL HEALTHCARE OPTIONS FOR PATIENTS  [facility] / e-Visit: [email or web address]         [facility]: [phone]  [facility]: [phone]                   [facility]: [phone]   

## 2018-10-27 ENCOUNTER — Encounter: Payer: Self-pay | Admitting: Oncology

## 2018-10-29 ENCOUNTER — Encounter: Payer: Self-pay | Admitting: Oncology

## 2018-10-30 ENCOUNTER — Encounter: Payer: Self-pay | Admitting: Oncology

## 2018-11-03 ENCOUNTER — Encounter: Payer: Self-pay | Admitting: Oncology

## 2018-11-27 ENCOUNTER — Other Ambulatory Visit: Payer: Self-pay | Admitting: Radiology

## 2018-11-27 ENCOUNTER — Telehealth: Payer: Self-pay | Admitting: *Deleted

## 2018-11-27 NOTE — Telephone Encounter (Signed)
Shirley from radiation called and scheduled a follow up appt with Dr. Denman George for Sept. Enid Derry will contact the patient

## 2018-11-27 NOTE — Telephone Encounter (Signed)
CALLED PATIENT TO INFORM OF FU APPT. WITH DR. Denman George ON 01-29-19 - ARRIVAL TIME- 1:30 PM, NO ANSWER, VM FULL UNABLE TO LEAVE MESSAGE, MAILED APPT. CARD

## 2018-11-30 ENCOUNTER — Ambulatory Visit (HOSPITAL_COMMUNITY)
Admission: RE | Admit: 2018-11-30 | Discharge: 2018-11-30 | Disposition: A | Discharge status: Home / Self Care | Payer: BC Managed Care – PPO | Source: Ambulatory Visit | Attending: Hematology and Oncology | Admitting: Hematology and Oncology

## 2018-11-30 ENCOUNTER — Other Ambulatory Visit: Payer: Self-pay

## 2018-11-30 ENCOUNTER — Encounter (HOSPITAL_COMMUNITY): Payer: Self-pay | Admitting: Interventional Radiology

## 2018-11-30 DIAGNOSIS — Z452 Encounter for adjustment and management of vascular access device: Secondary | ICD-10-CM | POA: Insufficient documentation

## 2018-11-30 DIAGNOSIS — C531 Malignant neoplasm of exocervix: Secondary | ICD-10-CM | POA: Diagnosis not present

## 2018-11-30 HISTORY — PX: IR REMOVAL TUN ACCESS W/ PORT W/O FL MOD SED: IMG2290

## 2018-11-30 MED ORDER — CEFAZOLIN SODIUM-DEXTROSE 2-4 GM/100ML-% IV SOLN
2.0000 g | INTRAVENOUS | Status: AC
  Administered 2018-11-30: 13:00:00 2 g via INTRAVENOUS

## 2018-11-30 MED ORDER — CEFAZOLIN SODIUM-DEXTROSE 2-4 GM/100ML-% IV SOLN
INTRAVENOUS | Status: AC
  Administered 2018-11-30: 2 g via INTRAVENOUS
  Filled 2018-11-30: qty 100

## 2018-11-30 MED ORDER — SODIUM CHLORIDE 0.9 % IV SOLN: INTRAVENOUS | Status: DC

## 2018-11-30 MED ORDER — LIDOCAINE-EPINEPHRINE (PF) 2 %-1:200000 IJ SOLN
INTRAMUSCULAR | Status: AC
  Filled 2018-11-30: qty 20

## 2018-11-30 NOTE — Discharge Instructions (Signed)
Implanted Port Removal, Care After °This sheet gives you information about how to care for yourself after your procedure. Your health care provider may also give you more specific instructions. If you have problems or questions, contact your health care provider. °What can I expect after the procedure? °After the procedure, it is common to have: °· Soreness or pain near your incision. °· Some swelling or bruising near your incision. °Follow these instructions at home: °Medicines °· Take over-the-counter and prescription medicines only as told by your health care provider. °· If you were prescribed an antibiotic medicine, take it as told by your health care provider. Do not stop taking the antibiotic even if you start to feel better. °Bathing °· Do not take baths, swim, or use a hot tub until your health care provider approves. Ask your health care provider if you can take showers. You may only be allowed to take sponge baths. °Incision care ° °· Follow instructions from your health care provider about how to take care of your incision. Make sure you: °? Wash your hands with soap and water before you change your bandage (dressing). If soap and water are not available, use hand sanitizer. °? Change your dressing as told by your health care provider. °? Keep your dressing dry. °? Leave stitches (sutures), skin glue, or adhesive strips in place. These skin closures may need to stay in place for 2 weeks or longer. If adhesive strip edges start to loosen and curl up, you may trim the loose edges. Do not remove adhesive strips completely unless your health care provider tells you to do that. °· Check your incision area every day for signs of infection. Check for: °? More redness, swelling, or pain. °? More fluid or blood. °? Warmth. °? Pus or a bad smell. °Driving ° °· Do not drive for 24 hours if you were given a medicine to help you relax (sedative) during your procedure. °· If you did not receive a sedative, ask your  health care provider when it is safe to drive. °Activity °· Return to your normal activities as told by your health care provider. Ask your health care provider what activities are safe for you. °· Do not lift anything that is heavier than 10 lb (4.5 kg), or the limit that you are told, until your health care provider says that it is safe. °· Do not do activities that involve lifting your arms over your head. °General instructions °· Do not use any products that contain nicotine or tobacco, such as cigarettes and e-cigarettes. These can delay healing. If you need help quitting, ask your health care provider. °· Keep all follow-up visits as told by your health care provider. This is important. °Contact a health care provider if: °· You have more redness, swelling, or pain around your incision. °· You have more fluid or blood coming from your incision. °· Your incision feels warm to the touch. °· You have pus or a bad smell coming from your incision. °· You have pain that is not relieved by your pain medicine. °Get help right away if you have: °· A fever or chills. °· Chest pain. °· Difficulty breathing. °Summary °· After the procedure, it is common to have pain, soreness, swelling, or bruising near your incision. °· If you were prescribed an antibiotic medicine, take it as told by your health care provider. Do not stop taking the antibiotic even if you start to feel better. °· Do not drive for 24 hours   if you were given a sedative during your procedure. °· Return to your normal activities as told by your health care provider. Ask your health care provider what activities are safe for you. °This information is not intended to replace advice given to you by your health care provider. Make sure you discuss any questions you have with your health care provider. °Document Released: 04/10/2015 Document Revised: 06/12/2017 Document Reviewed: 06/12/2017 °Elsevier Patient Education © 2020 [employer] ° °

## 2018-11-30 NOTE — Progress Notes (Signed)
Patient ID: Holly Pena, female   DOB: June 13, 1965, 53 y.o.   MRN: [id number] Patient presents to IR department today for Port-A-Cath removal.  She has a history of cervical cancer and is status post surgery and chemotherapy. She is no longer receiving treatment and presents today for Port-A-Cath removal.  She does not wish to receive IV conscious sedation for the procedure.  Details/risks of procedure, including but not limited to, internal bleeding, infection, injury to adjacent structures discussed with patient with her understanding and consent.

## 2019-01-06 ENCOUNTER — Encounter: Payer: Self-pay | Admitting: Radiation Oncology

## 2019-01-07 ENCOUNTER — Encounter: Payer: Self-pay | Admitting: Radiation Oncology

## 2019-01-08 ENCOUNTER — Inpatient Hospital Stay: Payer: BC Managed Care – PPO | Attending: Obstetrics | Admitting: Medical

## 2019-01-08 ENCOUNTER — Telehealth: Payer: Self-pay | Admitting: Oncology

## 2019-01-08 ENCOUNTER — Ambulatory Visit (HOSPITAL_COMMUNITY)
Admission: RE | Admit: 2019-01-08 | Discharge: 2019-01-08 | Disposition: A | Discharge status: Home / Self Care | Payer: BC Managed Care – PPO | Source: Ambulatory Visit | Attending: Medical | Admitting: Medical

## 2019-01-08 ENCOUNTER — Other Ambulatory Visit: Payer: Self-pay

## 2019-01-08 VITALS — BP 128/88 | HR 63 | Temp 98.5°F | Resp 18 | Ht 63.0 in | Wt 144.4 lb

## 2019-01-08 DIAGNOSIS — Z780 Asymptomatic menopausal state: Secondary | ICD-10-CM | POA: Diagnosis not present

## 2019-01-08 DIAGNOSIS — M7989 Other specified soft tissue disorders: Secondary | ICD-10-CM

## 2019-01-08 DIAGNOSIS — M542 Cervicalgia: Secondary | ICD-10-CM | POA: Diagnosis not present

## 2019-01-08 DIAGNOSIS — L538 Other specified erythematous conditions: Secondary | ICD-10-CM | POA: Insufficient documentation

## 2019-01-08 DIAGNOSIS — C530 Malignant neoplasm of endocervix: Secondary | ICD-10-CM | POA: Diagnosis not present

## 2019-01-08 DIAGNOSIS — Z9221 Personal history of antineoplastic chemotherapy: Secondary | ICD-10-CM | POA: Insufficient documentation

## 2019-01-08 DIAGNOSIS — C531 Malignant neoplasm of exocervix: Secondary | ICD-10-CM | POA: Diagnosis not present

## 2019-01-08 DIAGNOSIS — Z923 Personal history of irradiation: Secondary | ICD-10-CM | POA: Insufficient documentation

## 2019-01-08 MED ORDER — CEPHALEXIN 500 MG PO CAPS: 500.0000 mg | ORAL_CAPSULE | Freq: Four times a day (QID) | ORAL | 0 refills | Status: DC

## 2019-01-08 NOTE — Progress Notes (Signed)
[facility] Progress Note   Holly Pena [id number] 1965-10-14 53 y.o.  Eric Form is managed by Dr. Heath Lark  Actively treated with chemotherapy/immunotherapy/hormonal therapy: yes  Next scheduled appointment with provider: 01/29/2019 [facility])  Assessment: Plan:    Swelling in right armpit - Plan: VAS [place] UPPER EXTREMITY VENOUS DUPLEX, cephALEXin (KEFLEX) 500 MG capsule  Malignant neoplasm of endocervix ([place])   Swelling of the right arm with tenderness in the right neck and erythema of the right chest wall yesterday: Patient was referred for an ultrasound of the right upper extremity which showed no evidence of a DVT.  She was told to begin Motrin 400 mg p.o. twice daily x2 weeks.  She was also given a prescription for Keflex 500 mg p.o. 4 times daily x7 days.  Malignant neoplasm of the endocervix: The patient is status post cisplatin and radiation x4 doses.  She is scheduled to see Dr. Denman George in follow-up on 01/29/2019.  Please see After Visit Summary for patient specific instructions.  Future Appointments  Date Time Provider [place]  01/29/2019  2:00 PM Everitt Amber, MD [facility] None  04/26/2019 10:15 AM Gery Pray, MD [facility] None    Orders Placed This Encounter  Procedures   VAS [place] UPPER EXTREMITY VENOUS DUPLEX       Subjective:   Patient ID:  Holly Pena is a 53 y.o. (DOB 27-Aug-1965) female.  Chief Complaint:  Chief Complaint  Patient presents with   Neck Pain    Right    HPI Holly Pena   is a 53 year old female with a history of a malignant neoplasm of the endocervix who has been managed by Dr. Heath Lark.  She is status post 4 treatments of cisplatin with concurrent radiation therapy.  She had a port removed on July 20 and has noted mild swelling of the right upper extremity induration in her right lateral neck.  She reports having an area of fullness immediately superior to her prior port site.  She also reports having some recent erythema in  that area.  She denies fevers, chills, or sweats.  Medications: I have reviewed the patient's current medications.  Allergies: No Known Allergies  Past Medical History:  Diagnosis Date   Cervical cancer ([place])    Frequent headaches    due to lack of sleep   Numbness    both hands and fingers   PMB (postmenopausal bleeding)    Seasonal allergies    Squamous cell carcinoma in situ    Tuberculosis    positive, CXR clear    Past Surgical History:  Procedure Laterality Date   CESAREAN SECTION     x 2   ENDOMETRIAL BIOPSY  03/10/2018   IR IMAGING GUIDED PORT INSERTION  05/29/2018   IR REMOVAL TUN ACCESS W/ PORT W/O FL MOD SED  11/30/2018   lymph node surgery     at 21 months old, mother told her but not sure what surgery exactly   PELVIC LYMPH NODE DISSECTION N/A 04/28/2018   Procedure: PELVIC LYMPH NODE DISSECTION;  Surgeon: Everitt Amber, MD;  Location: [facility];  Service: Gynecology;  Laterality: N/A;   ROBOTIC ASSISTED TOTAL HYSTERECTOMY N/A 04/28/2018   Procedure: XI ROBOTIC ASSISTED RADICAL  HYSTERECTOMY;  Surgeon: Everitt Amber, MD;  Location: [facility];  Service: Gynecology;  Laterality: N/A;   SALPINGOOPHORECTOMY Bilateral 04/28/2018   Procedure: Marilynn Rail SALPINGO OOPHORECTOMY;  Surgeon: Everitt Amber, MD;  Location: [facility];  Service: Gynecology;  Laterality: Bilateral;  Family History  Problem Relation Age of Onset   Healthy Mother    Hypertension Mother    Healthy Father    Cancer Neg Hx    Heart disease Neg Hx     Social History   Socioeconomic History   Marital status: Married    Spouse name: Not on file   Number of children: 2   Years of education: 14   Highest education level: Not on file  Occupational History   Occupation: Ship broker    Comment: [employer] strain: Not on file   Food insecurity    Worry: Not on file    Inability: Not on file   Transportation needs    Medical: Not on file    Non-medical:  Not on file  Tobacco Use   Smoking status: Never Smoker   Smokeless tobacco: Never Used  Substance and Sexual Activity   Alcohol use: Yes    Comment: occ   Drug use: No   Sexual activity: Yes    Partners: Male    Birth control/protection: Condom, Post-menopausal    Comment: married  Lifestyle   Physical activity    Days per week: Not on file    Minutes per session: Not on file   Stress: Not on file  Relationships   Social connections    Talks on phone: Not on file    Gets together: Not on file    Attends religious service: Not on file    Active member of club or organization: Not on file    Attends meetings of clubs or organizations: Not on file    Relationship status: Not on file   Intimate partner violence    Fear of current or ex partner: Not on file    Emotionally abused: Not on file    Physically abused: Not on file    Forced sexual activity: Not on file  Other Topics Concern   Not on file  Social History Narrative   Ms. Minney is From [place].  She lives with her husband & 2 daughters.    She attends college.    Drinks caffeine,.   Wears her seatbelt. Smoke detector in the home.    Exercises routinely.   Feels safe in her relationships.    Past Medical History, Surgical history, Social history, and Family history were reviewed and updated as appropriate.   Please see review of systems for further details on the patient's review from today.   Review of Systems:  Review of Systems  Constitutional: Negative for chills, diaphoresis and fever.  HENT: Negative for trouble swallowing and voice change.   Respiratory: Negative for cough, chest tightness, shortness of breath and wheezing.   Cardiovascular: Negative for chest pain and palpitations.  Gastrointestinal: Negative for abdominal pain, constipation, diarrhea, nausea and vomiting.  Musculoskeletal: Negative for back pain and myalgias.       Mild right arm swelling  Skin: Positive for color change.    Neurological: Negative for dizziness, light-headedness and headaches.    Objective:   Physical Exam:  BP 128/88 (BP Location: Left Arm, Patient Position: Sitting)    Pulse 63    Temp 98.5 F (36.9 C) (Oral)    Resp 18    Ht 5\' 3"  (1.6 m)    Wt 144 lb 6.4 oz (65.5 kg)    LMP 02/20/2018 Comment: no period in 2 years   SpO2 99%    BMI 25.58 kg/m  ECOG: 0  Physical Exam Constitutional:      General: She is not in acute distress.    Appearance: Normal appearance. She is not ill-appearing or diaphoretic.  HENT:     Head: Normocephalic and atraumatic.  Neck:   Cardiovascular:     Pulses:          Radial pulses are 1+ on the right side and 1+ on the left side.  Musculoskeletal:       Arms:     Comments: Upper extremities are bilaterally equal.  Skin:    General: Skin is warm and dry.  Neurological:     Mental Status: She is alert.     Coordination: Coordination normal.     Gait: Gait normal.  Psychiatric:        Mood and Affect: Mood normal.        Behavior: Behavior normal.        Thought Content: Thought content normal.        Judgment: Judgment normal.     Lab Review:     Component Value Date/Time   NA 141 10/21/2018 0934   K 4.5 10/21/2018 0934   CL 108 10/21/2018 0934   CO2 25 10/21/2018 0934   GLUCOSE 85 10/21/2018 0934   BUN 11 10/21/2018 0934   CREATININE 0.83 10/21/2018 0934   CALCIUM 9.3 10/21/2018 0934   PROT 6.7 10/21/2018 0934   ALBUMIN 4.0 10/21/2018 0934   AST 26 10/21/2018 0934   ALT 25 10/21/2018 0934   ALKPHOS 53 10/21/2018 0934   BILITOT 0.6 10/21/2018 0934   GFRNONAA >60 10/21/2018 0934   GFRAA >60 10/21/2018 0934       Component Value Date/Time   WBC 2.6 (L) 10/21/2018 0934   RBC 3.87 10/21/2018 0934   HGB 11.9 (L) 10/21/2018 0934   HCT 36.2 10/21/2018 0934   PLT 154 10/21/2018 0934   MCV 93.5 10/21/2018 0934   MCH 30.7 10/21/2018 0934   MCHC 32.9 10/21/2018 0934   RDW 11.8 10/21/2018 0934   LYMPHSABS 0.6 (L) 10/21/2018 0934    MONOABS 0.2 10/21/2018 0934   EOSABS 0.2 10/21/2018 0934   BASOSABS 0.0 10/21/2018 0934   -------------------------------  Imaging from last 24 hours (if applicable):  Radiology interpretation: Vas [place] Upper Extremity Venous Duplex  Result Date: 01/08/2019 UPPER VENOUS STUDY  Indications: Pain Other Indications: Right sided chest port removed in July. Reports tenderness around right side neck. Performing Technologist: Oda Cogan RDMS, RVT  Examination Guidelines: A complete evaluation includes B-mode imaging, spectral Doppler, color Doppler, and power Doppler as needed of all accessible portions of each vessel. Bilateral testing is considered an integral part of a complete examination. Limited examinations for reoccurring indications may be performed as noted.  Right Findings: +----------+------------+---------+-----------+----------+-------+  RIGHT      Compressible Phasicity Spontaneous Properties Summary  +----------+------------+---------+-----------+----------+-------+  IJV            Full        Yes        Yes                         +----------+------------+---------+-----------+----------+-------+  Subclavian     Full        Yes        Yes                         +----------+------------+---------+-----------+----------+-------+  Axillary       Full  Yes        Yes                         +----------+------------+---------+-----------+----------+-------+  Brachial       Full        Yes        Yes                         +----------+------------+---------+-----------+----------+-------+  Radial         Full                                               +----------+------------+---------+-----------+----------+-------+  Ulnar          Full                                               +----------+------------+---------+-----------+----------+-------+  Cephalic       Full                                               +----------+------------+---------+-----------+----------+-------+   Basilic        Full                                               +----------+------------+---------+-----------+----------+-------+  Left Findings: +----------+------------+---------+-----------+----------+-------+  LEFT       Compressible Phasicity Spontaneous Properties Summary  +----------+------------+---------+-----------+----------+-------+  Subclavian     Full        Yes        Yes                         +----------+------------+---------+-----------+----------+-------+  Summary:  Right: No evidence of deep vein thrombosis in the upper extremity. No evidence of superficial vein thrombosis in the upper extremity.  Left: No evidence of thrombosis in the subclavian.  *See table(s) above for measurements and observations.    Preliminary

## 2019-01-08 NOTE — Progress Notes (Signed)
Right upper ext venous  has been completed. Refer to [facility] under chart review to view preliminary results.   01/08/2019  1:02 PM Skipper Dacosta, Bonnye Fava

## 2019-01-08 NOTE — Patient Instructions (Signed)
Vascular Ultrasound A vascular ultrasound is a painless test that is done to see if you have blood flow problems or clots in your blood vessels. It uses harmless sound waves to take pictures of the arteries and veins in your body. The pictures are taken by passing a device (transducer) over certain areas of your body. Tell a health care provider about:  Any allergies you have.  All medicines you are taking, including vitamins, herbs, eye drops, creams, and over-the-counter medicines.  Any blood disorders you have.  Any surgeries you have had.  Any medical conditions you have.  Whether you are pregnant or may be pregnant. What are the risks? Generally, this is a safe procedure. There are no known risks or complications that arise from having an ultrasound. What happens before the procedure?  If the ultrasound scan involves your upper abdomen, you may be told not to eat or chew gum the morning of your exam. Follow your health care provider's instructions.  Do not smoke or use nicotine products at least 30 minutes before the exam.  During the test, a gel will be applied to your skin. What happens during the procedure?   A gel will be applied to your skin. It may feel cool.  The transducer will be placed on the area to be examined.  Pictures will be taken. They will be displayed on one or more monitors that look like small television screens. What happens after the procedure?  You can safely drive home immediately after your exam.  You may resume your normal diet and activities.  Keep all follow-up visits as told by your health care provider. This is important.  It is up to you to get your test results. Ask your health care provider, or the department that is doing the test: ? When will my results be ready? ? How will I get my results? ? What are my treatment options? ? What other tests do I need? ? What are my next steps? Summary  A vascular ultrasound is a painless test  that is done to see if you have blood flow problems or clots in your blood vessels. It uses harmless sound waves to take pictures of the arteries and veins in your body.  Generally, this is a safe procedure. There are no known risks or complications that arise from having an ultrasound.  A gel will be applied to your skin. It may feel cool. The device that takes the pictures (transducer) will then be placed on the area to be examined.  It is up to you to get your test results. Ask your health care provider or the department that is doing the test when your results will be ready and how you will get your results. This information is not intended to replace advice given to you by your health care provider. Make sure you discuss any questions you have with your health care provider. Document Released: 05/10/2004 Document Revised: 08/18/2018 Document Reviewed: 06/05/2017 Elsevier Patient Education  2020 [employer].

## 2019-01-08 NOTE — Telephone Encounter (Signed)
Holly Pena called and said she had her port removed on 11/30/18 and now she thinks the area is infected.  She said it feels swollen inside and it is pink on the outside.  She sometimes has the feeling that needles are poking her in the area or a burning sensation.  Appointment scheduled for 10:45 with Sandi Mealy, PA-C.

## 2019-01-08 NOTE — Progress Notes (Signed)
Pt had R sided chest port removed in July.  Reports tenderness around R side neck.  Incision intact without redness, drainage, or edema.  Reports pain in R side neck that radiates to collarbone and R arm.  Slightly hardened/corded area along R sided neck vein.  Pt states "it feels like they left something in there".  Denies CP or SOB or trouble swallowing.  Denies recent injury.  Pt declines need for interpreter at this time.  Able to answer all questions by RN and PA in [facility] without assistance.

## 2019-01-19 ENCOUNTER — Other Ambulatory Visit: Payer: Self-pay | Admitting: Gynecologic Oncology

## 2019-01-19 ENCOUNTER — Encounter: Payer: Self-pay | Admitting: Gynecologic Oncology

## 2019-01-19 ENCOUNTER — Telehealth: Payer: Self-pay | Admitting: *Deleted

## 2019-01-19 DIAGNOSIS — C539 Malignant neoplasm of cervix uteri, unspecified: Secondary | ICD-10-CM

## 2019-01-19 NOTE — Telephone Encounter (Signed)
Called and scheduled the patient for a lab appt  

## 2019-01-20 ENCOUNTER — Other Ambulatory Visit: Payer: Self-pay | Admitting: Gynecologic Oncology

## 2019-01-20 DIAGNOSIS — Z1211 Encounter for screening for malignant neoplasm of colon: Secondary | ICD-10-CM

## 2019-01-20 NOTE — Progress Notes (Signed)
See mychart note.  Patient requesting colon cancer screening.

## 2019-01-29 ENCOUNTER — Other Ambulatory Visit: Payer: Self-pay

## 2019-01-29 ENCOUNTER — Encounter: Payer: Self-pay | Admitting: Gynecologic Oncology

## 2019-01-29 ENCOUNTER — Inpatient Hospital Stay: Payer: BC Managed Care – PPO

## 2019-01-29 ENCOUNTER — Inpatient Hospital Stay: Payer: BC Managed Care – PPO | Attending: Obstetrics | Admitting: Gynecologic Oncology

## 2019-01-29 ENCOUNTER — Other Ambulatory Visit (HOSPITAL_COMMUNITY)
Admission: RE | Admit: 2019-01-29 | Discharge: 2019-01-29 | Disposition: A | Discharge status: Home / Self Care | Payer: BC Managed Care – PPO | Source: Ambulatory Visit | Attending: Gynecologic Oncology | Admitting: Gynecologic Oncology

## 2019-01-29 VITALS — BP 123/85 | HR 64 | Temp 98.2°F | Resp 18 | Ht 63.0 in | Wt 141.0 lb

## 2019-01-29 DIAGNOSIS — C539 Malignant neoplasm of cervix uteri, unspecified: Secondary | ICD-10-CM

## 2019-01-29 DIAGNOSIS — Z923 Personal history of irradiation: Secondary | ICD-10-CM | POA: Insufficient documentation

## 2019-01-29 DIAGNOSIS — Z9221 Personal history of antineoplastic chemotherapy: Secondary | ICD-10-CM

## 2019-01-29 DIAGNOSIS — Z90710 Acquired absence of both cervix and uterus: Secondary | ICD-10-CM | POA: Diagnosis not present

## 2019-01-29 DIAGNOSIS — Z90722 Acquired absence of ovaries, bilateral: Secondary | ICD-10-CM | POA: Insufficient documentation

## 2019-01-29 LAB — CBC WITH DIFFERENTIAL (CANCER CENTER ONLY)
Abs Immature Granulocytes: 0.01 10*3/uL (ref 0.00–0.07)
Basophils Absolute: 0 10*3/uL (ref 0.0–0.1)
Basophils Relative: 1 %
Eosinophils Absolute: 0.2 10*3/uL (ref 0.0–0.5)
Eosinophils Relative: 6 %
HCT: 38.5 % (ref 36.0–46.0)
Hemoglobin: 13.1 g/dL (ref 12.0–15.0)
Immature Granulocytes: 0 %
Lymphocytes Relative: 17 %
Lymphs Abs: 0.6 10*3/uL — ABNORMAL LOW (ref 0.7–4.0)
MCH: 30 pg (ref 26.0–34.0)
MCHC: 34 g/dL (ref 30.0–36.0)
MCV: 88.1 fL (ref 80.0–100.0)
Monocytes Absolute: 0.2 10*3/uL (ref 0.1–1.0)
Monocytes Relative: 6 %
Neutro Abs: 2.5 10*3/uL (ref 1.7–7.7)
Neutrophils Relative %: 70 %
Platelet Count: 161 10*3/uL (ref 150–400)
RBC: 4.37 MIL/uL (ref 3.87–5.11)
RDW: 12.1 % (ref 11.5–15.5)
WBC Count: 3.6 10*3/uL — ABNORMAL LOW (ref 4.0–10.5)
nRBC: 0 % (ref 0.0–0.2)

## 2019-01-29 NOTE — Progress Notes (Signed)
[place] at [facility]   Progress Note: Established Patient Follow-Up Visit   Consult was originally requested by Dr. Mora Bellman for cervical cancer  Chief Complaint  Patient presents with  . Cervical Cancer    follow-up     GYN Oncologic Summary 1. Stage IIIC SCCa Cervix S/p radical hysterectomy, BSO, lymphadenectomy on 04/28/18  HPI: [name]  is a very nice 53 y.o.  P2  Interval History She went on to complete chemoradiation for advanced stage cervical cancer between January 30 and July 21, 2018.  She received a total of 45 Gray external beam radiation to the pelvis and 25 fractions, with a central pelvic boost of 3 fractions for a total of 5.4 Gray to the central pelvis.  She received weekly radiosensitizing cisplatin chemotherapy.  During therapy she developed neutropenia.  Post treatment restaging PET scan on October 21, 2018 which did not show any findings for residual or recurrent disease in the pelvis.  It did show persistence of a focal hypermetabolic activity and a subcentimeter neck node.  She subsequently was evaluated by ENT and no pharyngeal lesion was appreciated.  She has had persistent neutropenia postoperatively with her most recent white blood cell count at 3.6 which is improved from a prior of 2.6.  She reports no residual toxicities of therapy.  She denies using her vaginal dilator because she is afraid.  Oncologic Course She went through menopause ~2017. On February 20, 2018 she noted post-menopausal bleeding that lasted ~ 2 weeks (intermittent). She presented to her PCP who referred her on to Dr. Elly Modena. On exam she was noted to have a bulky and friable cervix. A Pap and EMB were performed.   A TVUS was ordered and done 03/17/18 revealing a normal uterus with 73mm EM lining. No adnexal masses.  Pap and EMB revealed squamous cell CA.  She is thus referred for management and recommendations.  Her last pelvic/Pap  was at the time of her last pregnancy ~16 years ago  Dr Gerarda Fraction took her to the operating room for an EUA and cervical biopsies due to intolerance of examination in the office. Findings as noted:  Anterior cervical lip with gross lesion from ~10-12:00. Remainder of cervix grossly normal. Estimate lesion to be <2cm. No vaginal or parametrial involvement.   Grossly the remainder of the cervix appeared normal. It was firm on palpation but no fungating lesions. Pathology confirmed what the Pap/EMB showed 1. Cervix, biopsy, 12 o'clock - INVASIVE MODERATELY DIFFERENTIATED SQUAMOUS CELL CARCINOMA. SEE NOTE. 2. Cervix, biopsy, 3 o'clock - HIGH GRADE SQUAMOUS INTRAEPITHELIAL LESION (CIN-III, HIGH GRADE DYSPLASIA). SEE NOTE. 3. Cervix, biopsy, 9 o'clock - INVASIVE MODERATELY DIFFERENTIATED SQUAMOUS CELL CARCINOMA. SEE NOTE. 4. Cervix, biopsy, 6 o'clock - HIGH GRADE SQUAMOUS INTRAEPITHELIAL LESION (CIN-III, HIGH GRADE DYSPLASIA). SEE NOTE. Diagnosis Note 1. 2, 3 and 4. Dr. Melina Copa has reviewed this case and concurs with the above interpretation. (NDK:gt, 03/25/18) [name] MD   Dr Gerarda Fraction staged her clinically as a Stage IB1 (FIGO 2018) SCCa Cervix and ordered a PET/CT. Because Dr Gerarda Fraction stated that the tumor was <2cm, she counseled the patient that she was a candidate for either MIS or open approach. The patient elected for MIS approach and therefore elected to have surgery with Dr Denman George as Dr Gerarda Fraction does not perform radical hysterectomy via a MIS route.   PET/CT resulted with no apparent metastatic disease (though there was a mildly PET avid node in the neck felt  to be unrelated). She had a 3.5cm area of increased avidity in the cervix.   The patient elected for robotic assisted hysterectomy, BSO, lymphadenectomy.   On April 28, 2018 she underwent robotic assisted type III radical laparoscopic hysterectomy with bilateral salpingectomy and bilateral pelvic lymphadenectomy.  Surgery was  uncomplicated.  Operative findings is significant for 3.5 cm tumor placed in the anterior and right cervix with no gross parametrial involvement.  Pathology revealed a 4.5 x 2.6 x 1 cm invasive, poorly differentiated squamous cell carcinoma.  This resided in the cervix.  Margins were not involved.  There was lymphovascular involvement by the tumor.  2 of 6 paracervical lymph nodes were positive for metastatic carcinoma.  The right and left pelvic lymphadenectomy specimens were negative for metastatic disease.  She was determined to have high risk features for recurrence with deep cervical stromal involvement, large sized tumor greater than 4 cm, lymphovascular space invasion, and metastatic involvement of lymph nodes.  In accordance with NCCN guidelines she was recommended adjuvant radiation with chemosensitization with cisplatin.  Postoperatively she was seen in the office for evaluation for a voiding trial.  She was initially unable to void urine spontaneously after Foley removal and required replacement and eventual intermittent self catheterization.   Imported EPIC Oncologic History:  Oncology History  Cervical cancer ([place])  02/20/2018 Initial Diagnosis   She presented with postmenopausal bleeding   03/10/2018 Pathology Results   Endometrium, biopsy - SQUAMOUS CELL CARCINOMA. - SEE COMMENT.   03/17/2018 Imaging   US pelvis: Endometrium measures 7 mm. In the setting of post-menopausal bleeding, endometrial sampling is indicated to exclude carcinoma   03/24/2018 Pathology Results   1. Cervix, biopsy, 12 o'clock - INVASIVE MODERATELY DIFFERENTIATED SQUAMOUS CELL CARCINOMA. SEE NOTE. 2. Cervix, biopsy, 3 o'clock - HIGH GRADE SQUAMOUS INTRAEPITHELIAL LESION (CIN-III, HIGH GRADE DYSPLASIA). SEE NOTE. 3. Cervix, biopsy, 9 o'clock - INVASIVE MODERATELY DIFFERENTIATED SQUAMOUS CELL CARCINOMA. SEE NOTE. 4. Cervix, biopsy, 6 o'clock - HIGH GRADE SQUAMOUS INTRAEPITHELIAL LESION (CIN-III, HIGH  GRADE DYSPLASIA). SEE NOTE.   03/24/2018 Procedure   Pre-operative Diagnosis:  Suspect SCCa Cervix based on endometrial biopsy  Post-operative Diagnosis:  Stage IB1 (FIGO 2018) SCCa Cervix  Operation:  Exam under anesthesia Cervical biopsies  Operative Findings:  Anterior cervical lip with gross lesion from ~10-12:00. Remainder of cervix grossly normal. Estimate lesion to be <2cm. No vaginal or parametrial involvement.   03/30/2018 Imaging   CT scan of chest, abdomen and pelvis: 1. Isolated low-density structure lateral to the descending colon. Differential considerations include isolated peritoneal implant/metastasis, GI duplication/mesenteric cyst, or postoperative collection such as seroma (clinical history only describes prior Caesarean section). Consider further evaluation with PET. 2. Otherwise, no evidence of metastatic disease in the chest, abdomen, or pelvis.   04/04/2018 PET scan   1. Prominent hypermetabolism within the cervix, consistent with known cervical cancer. No parametrial extension of tumor or abdominopelvic nodal metastatic disease. 2. The previously demonstrated low-density structure posterior to the descending colon demonstrates no hypermetabolic activity and is likely an incidental duplication/mesenteric cyst. 3. Focal hypermetabolic activity within a single right neck lymph node (level 1B). This is unlikely to be related to the patient's cervical cancer. No hypermetabolism is seen within the pharyngeal mucosal space. ENT evaluation should be considered, especially if the patient has risk factors for head and neck malignancy.   04/28/2018 Pathology Results   1. Lymph nodes, regional resection, right pelvic - EIGHT BENIGN LYMPH NODES (0/8). 2. Lymph nodes, regional resection, left pelvic -  SEVEN BENIGN LYMPH NODES (0/7). 3. Uterus, cervix and bilateral fallopian tubes, upper vagina CERVIX: - INVASIVE POORLY DIFFERENTIATED SQUAMOUS CELL CARCINOMA, 4.5 X 2.6 X  1.0 CM. - MARGINS NOT INVOLVED. - LYMPHOVASCULAR INVOLVEMENT BY TUMOR. - METASTATIC CARCINOMA IN TWO OF SIX PARACERVICAL LYMPH NODES (2/6). BENIGN ENDOMETRIUM AND MYOMETRIUM SEE ONCOLOGY TABLE. 4. Vagina, biopsy, anterior vaginal margin - SQUAMOUS MUCOSA WITH SLIGHT INFLAMMATION. - NO EVIDENCE OF INVASIVE CARCINOMA. Microscopic Comment 3. UTERINE CERVIX: Resection  Procedure: Hysterectomy with right and left pelvic regional lymph nodes and anterior vaginal margin. Tumor Size: 4.5 x 2.6 x 1.0 cm. Histologic Type: Squamous cell carcinoma Histologic Grade: Poorly differentiated Stromal Invasion: Yes Depth of stromal invasion (millimeters): 10 mm Horizontal extent longitudinal/length (if applicable#) (millimeters): 25 mm Horizontal extent circumferential/width (if applicable#) (millimeters): 45 mm Other Tissue/ Organ: No Margins: Free of tumor Lymphovascular: Present Regional Lymph Nodes: Two positive for tumor cells (select all that apply) Total Number of Lymph Nodes Examined: Twenty-one Number of Sentinel Nodes Examined (if applicable): 0 Pathologic Stage Classification (pTNM, AJCC 8th Edition): pT1b2, pN1 Ancillary Studies: N/A Representative Tumor Block: 3C, 3D, 3E, 4F, 3J and 3K Comment(s): The tumor is a poorly differentiated invasive squamous cell carcinoma which is up to 1.0 cm (10 mm) in thickness. There is lymphovascular space involvement within the wall of the cervix and in the paracervical connective tissue. The margins of the specimen are not involved by carcinoma (JDP:kh 04/29/18)   04/28/2018 Surgery   Surgeon: Donaciano Eva  Pre-operative Diagnosis: stage IB2 cervical cancer  Post-operative Diagnosis: same  Operation: Robotic-assisted type III radical laparoscopic hysterectomy with bilateral salpingectomy and bilateral pelvic lymphadenectomy  Surgeon: Donaciano Eva  Operative Findings:  : 3.5cm tumor replacing the anterior and right cervix. No  gross parametrial involvement.         Specimens: left and right pelvic nodes, uterus with cervix, bilateral tubes and upper vagina. Anterior vaginal margin with marking stitch at 12 o'clock of new true distal anterior vaginal margin         Complications:  None; patient tolerated the procedure well.            05/11/2018 Cancer Staging   Staging form: Cervix Uteri, AJCC 8th Edition - Pathologic stage from 05/11/2018: Stage III (pT3, pN1, cM0) - Signed by Heath Lark, MD on 05/11/2018   05/12/2018 Imaging   1. Soft tissue inflammation about the bladder could reflect cystitis. Would correlate with the patient's symptoms. 2. Small to moderate amount of free fluid within the pelvis, of uncertain significance. This may simply be postoperative in nature, though if urine output from the Foley catheter decreases, further evaluation could be considered to exclude urine leak.   05/29/2018 Procedure   Placement of a CT injectable subcutaneous port device.   06/12/2018 - 07/03/2018 Chemotherapy   She received weekly cisplatin with radiation x 4 doses. She is not able to receive any more chemo due to severe pancytopenia   10/21/2018 PET scan   1. Status post hysterectomy. No findings identified to suggest residual or recurrent FDG avid tumor within the pelvis. No evidence for abdominopelvic nodal metastasis. 2. Persistence of focal hypermetabolic activity within subcentimeter right level 1B lymph node. No hypermetabolism is seen within the pharyngeal mucosal space. ENT evaluation should be considered, especially if the patient has risk factors for head and neck malignancy.   11/30/2018 Procedure   Successful right IJ vein Port-A-Cath explant.     Measurement of disease: TBD . Marland Kitchen  Radiology:  Ir Removal [employer] W/o [place] Mod Sed  Result Date: 11/30/2018 INDICATION: 53 year old female with a history of cervical cancer. A right IJ port catheter was placed by interventional radiology (Dr.  Anselm Pancoast) on 05/29/2018. The patient has subsequently completed all therapy and is now in remission. Port catheter is no longer required and she presents for removal of the same. EXAM: REMOVAL RIGHT IJ VEIN PORT-A-CATH MEDICATIONS: 2 g Ancef; The antibiotic was administered within an appropriate time interval prior to skin puncture. ANESTHESIA/SEDATION: None FLUOROSCOPY TIME:  None COMPLICATIONS: None immediate. PROCEDURE: Informed written consent was obtained from the patient after a thorough discussion of the procedural risks, benefits and alternatives. All questions were addressed. Maximal Sterile Barrier Technique was utilized including caps, mask, sterile gowns, sterile gloves, sterile drape, hand hygiene and skin antiseptic. A timeout was performed prior to the initiation of the procedure. The right chest was prepped and draped in a sterile fashion. Lidocaine was utilized for local anesthesia. An incision was made over the previously healed surgical incision. Utilizing blunt dissection, the port catheter and reservoir were removed from the underlying subcutaneous tissue in their entirety. Securing sutures were also removed. The pocket was irrigated with a copious amount of sterile normal saline. The pocket was closed with interrupted 3-0 Vicryl stitches. The subcutaneous tissue was closed with 3-0 Vicryl interrupted subcutaneous stitches. A 4-0 Vicryl running subcuticular stitch was utilized to approximate the skin. Dermabond was applied. IMPRESSION: Successful right IJ vein Port-A-Cath explant. Electronically Signed   By: Jacqulynn Cadet M.D.   On: 11/30/2018 16:13   Vas [place] Upper Extremity Venous Duplex  Result Date: 01/08/2019 UPPER VENOUS STUDY  Indications: Pain Other Indications: Right sided chest port removed in July. Reports tenderness around right side neck. Performing Technologist: Oda Cogan RDMS, RVT  Examination Guidelines: A complete evaluation includes B-mode imaging, spectral Doppler,  color Doppler, and power Doppler as needed of all accessible portions of each vessel. Bilateral testing is considered an integral part of a complete examination. Limited examinations for reoccurring indications may be performed as noted.  Right Findings: +----------+------------+---------+-----------+----------+-------+ RIGHT     CompressiblePhasicitySpontaneousPropertiesSummary +----------+------------+---------+-----------+----------+-------+ IJV           Full       Yes       Yes                      +----------+------------+---------+-----------+----------+-------+ Subclavian    Full       Yes       Yes                      +----------+------------+---------+-----------+----------+-------+ Axillary      Full       Yes       Yes                      +----------+------------+---------+-----------+----------+-------+ Brachial      Full       Yes       Yes                      +----------+------------+---------+-----------+----------+-------+ Radial        Full                                          +----------+------------+---------+-----------+----------+-------+ Ulnar  Full                                          +----------+------------+---------+-----------+----------+-------+ Cephalic      Full                                          +----------+------------+---------+-----------+----------+-------+ Basilic       Full                                          +----------+------------+---------+-----------+----------+-------+  Left Findings: +----------+------------+---------+-----------+----------+-------+ LEFT      CompressiblePhasicitySpontaneousPropertiesSummary +----------+------------+---------+-----------+----------+-------+ Subclavian    Full       Yes       Yes                      +----------+------------+---------+-----------+----------+-------+  Summary:  Right: No evidence of deep vein thrombosis in the upper  extremity. No evidence of superficial vein thrombosis in the upper extremity.  Left: No evidence of thrombosis in the subclavian.  *See table(s) above for measurements and observations.  Diagnosing physician: Deitra Mayo MD Electronically signed by Deitra Mayo MD on 01/08/2019 at 4:38:49 PM.    Final    .   Outpatient Encounter Medications as of 01/29/2019  Medication Sig  . cholecalciferol (VITAMIN D3) 25 MCG (1000 UT) tablet Take 1,000 Units by mouth daily.  . Glucosamine-Chondroitin (MOVE FREE PO) Take 1 tablet by mouth daily.   Marland Kitchen ibuprofen (ADVIL,MOTRIN) 600 MG tablet Take 1 tablet (600 mg total) by mouth every 6 (six) hours as needed for mild pain.  . [DISCONTINUED] cephALEXin (KEFLEX) 500 MG capsule Take 1 capsule (500 mg total) by mouth 4 (four) times daily. (Patient not taking: Reported on 01/29/2019)   No facility-administered encounter medications on file as of 01/29/2019.    Not on File  Past Medical History:  Diagnosis Date  . Cervical cancer ([place])   . Frequent headaches    due to lack of sleep  . Numbness    both hands and fingers  . PMB (postmenopausal bleeding)   . Seasonal allergies   . Squamous cell carcinoma in situ   . Tuberculosis    positive, CXR clear   Past Surgical History:  Procedure Laterality Date  . CESAREAN SECTION     x 2  . ENDOMETRIAL BIOPSY  03/10/2018  . IR IMAGING GUIDED PORT INSERTION  05/29/2018  . IR REMOVAL TUN ACCESS W/ PORT W/O FL MOD SED  11/30/2018  . lymph node surgery     at 84 months old, mother told her but not sure what surgery exactly  . PELVIC LYMPH NODE DISSECTION N/A 04/28/2018   Procedure: PELVIC LYMPH NODE DISSECTION;  Surgeon: Everitt Amber, MD;  Location: [facility];  Service: Gynecology;  Laterality: N/A;  . ROBOTIC ASSISTED TOTAL HYSTERECTOMY N/A 04/28/2018   Procedure: XI ROBOTIC ASSISTED RADICAL  HYSTERECTOMY;  Surgeon: Everitt Amber, MD;  Location: [facility];  Service: Gynecology;  Laterality: N/A;  . SALPINGOOPHORECTOMY  Bilateral 04/28/2018   Procedure: Marilynn Rail SALPINGO OOPHORECTOMY;  Surgeon: Everitt Amber, MD;  Location: [facility];  Service: Gynecology;  Laterality: Bilateral;        Past Gynecological  History:   GYNECOLOGIC HISTORY:  . Patient's last menstrual period was 02/20/2018. age 68 . Menarche: 53 years old . P 2 . Contraceptive condoms . HRT None  . Last Pap  Referral pap SCCa Family Hx:  Family History  Problem Relation Age of Onset  . Healthy Mother   . Hypertension Mother   . Healthy Father   . Cancer Neg Hx   . Heart disease Neg Hx    Social Hx:  Marland Kitchen Tobacco use: none . Alcohol use: none . Illicit Drug use: none . Illicit IV Drug use: none    Review of Systems: Review of Systems  Constitutional: Negative.   HENT:  Negative.   Eyes: Negative.   Respiratory: Negative.   Cardiovascular: Negative.   Gastrointestinal: Negative.   Endocrine: Negative.   Genitourinary: Negative for difficulty urinating and vaginal bleeding.   Musculoskeletal: Negative.   Skin: Negative.   Neurological: Negative.   Hematological: Negative.   Psychiatric/Behavioral: Negative.   All other systems reviewed and are negative.  Vitals:  Vitals:   01/29/19 1412  BP: 123/85  Pulse: 64  Resp: 18  Temp: 98.2 F (36.8 C)  SpO2: 100%   Vitals:   01/29/19 1412  Weight: 141 lb (64 kg)  Height: 5\' 3"  (1.6 m)   Body mass index is 24.98 kg/m.  Physical Exam: General :  Well developed, 53 y.o., female in no apparent distress HEENT:  Normocephalic/atraumatic, symmetric, EOMI, eyelids normal Neck:   No visible masses.  Respiratory:  Respirations unlabored, no use of accessory muscles CV:   Deferred Breast:   Deferred Musculoskeletal: Normal muscle strength. Abdomen:  No visible masses or protrusion. Well healed incisions Extremities:  No visible edema or deformities Skin:   Normal inspection Neuro/Psych:  No focal motor deficit, no abnormal mental status. Normal gait. Normal affect. Alert and  oriented to person, place, and time  Genitourinary: Vagina significantly shortened (<10cm) with in tact cuff, suture material present, healing normally.    Assessment  Stage IIIC SCCa Cervix s/p chemoradiation completed March, 2020. Complete clinical response.   Plan  I recommend continued surveillance with 3 monthly evaluations with Dr Sondra Come and myself. She will discuss follow-up of persistent neutropenia with Dr Alvy Bimler.  Everitt Amber, MD Gynecologic Oncologist 01/29/2019, 5:17 PM

## 2019-01-29 NOTE — Patient Instructions (Signed)
Please notify Dr Denman George at phone number [phone] if you notice vaginal bleeding, new pelvic or abdominal pains, bloating, feeling full easy, or a change in bladder or bowel function.   Please have Dr Clabe Seal office contact Dr Serita Grit office (at [phone]) in December to request an appointment with her for March, 2021.  A pap smear was tested today, her office will call you with the results.

## 2019-02-03 ENCOUNTER — Encounter: Payer: Self-pay | Admitting: Gastroenterology

## 2019-02-03 ENCOUNTER — Telehealth: Payer: Self-pay | Admitting: *Deleted

## 2019-02-03 NOTE — Telephone Encounter (Signed)
Called and left the patient a message to call the office back. Need to see if she still needs a referral to GI

## 2019-02-03 NOTE — Telephone Encounter (Signed)
Patient called back and was given the number for [facility]

## 2019-02-08 LAB — CYTOLOGY - PAP
Diagnosis: NEGATIVE
High risk HPV: NEGATIVE
Molecular Disclaimer: 56
Molecular Disclaimer: NORMAL

## 2019-02-14 ENCOUNTER — Encounter: Payer: Self-pay | Admitting: Gynecologic Oncology

## 2019-02-15 ENCOUNTER — Telehealth: Payer: Self-pay

## 2019-02-15 NOTE — Telephone Encounter (Signed)
I spoke with Holly Pena regarding her PAP smear results which showed atrophic vaginitis. The patient states she has vaginal dryness that causes discomfort during intercourse.  Per Holly John, NP the patient is to try lubricant such as olive or coconut oil.  I told Holly Pena if this does not help to let us know. Holly Pena verbalized understanding.

## 2019-02-18 ENCOUNTER — Ambulatory Visit (AMBULATORY_SURGERY_CENTER): Payer: Self-pay | Admitting: *Deleted

## 2019-02-18 ENCOUNTER — Other Ambulatory Visit: Payer: Self-pay

## 2019-02-18 ENCOUNTER — Telehealth: Payer: Self-pay | Admitting: *Deleted

## 2019-02-18 VITALS — Temp 97.3°F | Ht 63.0 in | Wt 141.8 lb

## 2019-02-18 DIAGNOSIS — Z1211 Encounter for screening for malignant neoplasm of colon: Secondary | ICD-10-CM

## 2019-02-18 MED ORDER — SUPREP BOWEL PREP KIT 17.5-3.13-1.6 GM/177ML PO SOLN: 1.0000 | Freq: Once | ORAL | 0 refills | Status: AC

## 2019-02-18 NOTE — Telephone Encounter (Signed)
Thank you. Will proceed as scheduled

## 2019-02-18 NOTE — Telephone Encounter (Signed)
Dr Silverio Decamp,  I saw this pt today in [place]- she told me she had cervical cancer last year- she finished up Chemo 06-2018 and radiation 07-25-2018- her post has been removed and shes doing well.   I just wanted to make sure she is ok to proceed with her direct screening colon 10-20 since she finished uo this year with her chemo and radiation.  Thanks so much, Lelan Pons

## 2019-02-18 NOTE — Telephone Encounter (Signed)
Yes it is fine to proceed with colonoscopy given she is more than 6 months from her last radiation therapy.  Thank you

## 2019-02-18 NOTE — Progress Notes (Signed)
No egg or soy allergy known to patient  No issues with past sedation with any surgeries  or procedures, no intubation problems  No diet pills per patient No home 02 use per patient  No blood thinners per patient  Pt denies issues with constipation - pt states she had constipation with radiation but not now- -  Since radiation she has sticky fluids with her loose stools now , no blood noted- is yellow in color both the stool and the sticky liquids     Video Interpreter [place] [phone] used in [place] today -- Pt and RN and Interpreter went over all instructions and pt verbalized understanding of all instructions given today in PV_ Pt states she does have some one who speaks English to help her at home with her instructions- Encouraged her to call with questions   No A fib or A flutter  EMMI video sent to pt's e mail  Suprep $15 coupon to pt today  Due to the COVID-19 pandemic we are asking patients to follow these guidelines. Please only bring one care partner. Please be aware that your care partner may wait in the car in the parking lot or if they feel like they will be too hot to wait in the car, they may wait in the lobby on the 4th floor. All care partners are required to wear a mask the entire time (we do not have any that we can provide them), they need to practice social distancing, and we will do a Covid check for all patient's and care partners when you arrive. Also we will check their temperature and your temperature. If the care partner waits in their car they need to stay in the parking lot the entire time and we will call them on their cell phone when the patient is ready for discharge so they can bring the car to the front of the building. Also all patient's will need to wear a mask into building.

## 2019-02-19 ENCOUNTER — Telehealth: Payer: Self-pay | Admitting: Gastroenterology

## 2019-02-19 ENCOUNTER — Encounter: Payer: Self-pay | Admitting: Gastroenterology

## 2019-02-19 NOTE — Telephone Encounter (Signed)
Pt called to inform that Dr. Silverio Decamp is no longer in-Network with her insurance so she has to look for another GI.

## 2019-02-19 NOTE — Telephone Encounter (Signed)
ok 

## 2019-03-01 ENCOUNTER — Telehealth: Payer: Self-pay

## 2019-03-01 NOTE — Telephone Encounter (Signed)
Patient emailed and called about a week ago about cancellation. Holly Pena states that her insurance was not in network. Appt should've been cx then. I explained that if our other providers were in network Holly Pena should be able to see Dr. Silverio Decamp as well. Holly Pena will contact her insurance to confirm why Holly Pena can't see Dr. Silverio Decamp

## 2019-03-01 NOTE — Telephone Encounter (Signed)
Covid-19 screening questions   Do you now or have you had a fever in the last 14 days?  Do you have any respiratory symptoms of shortness of breath or cough now or in the last 14 days?  Do you have any family members or close contacts with diagnosed or suspected Covid-19 in the past 14 days?  Have you been tested for Covid-19 and found to be positive?       

## 2019-03-02 ENCOUNTER — Encounter: Payer: Self-pay | Admitting: Gastroenterology

## 2019-04-05 ENCOUNTER — Encounter: Payer: Self-pay | Admitting: Hematology and Oncology

## 2019-04-06 ENCOUNTER — Encounter: Payer: Self-pay | Admitting: Hematology and Oncology

## 2019-04-06 ENCOUNTER — Telehealth: Payer: Self-pay | Admitting: Oncology

## 2019-04-06 ENCOUNTER — Inpatient Hospital Stay: Payer: BC Managed Care – PPO | Attending: Obstetrics | Admitting: Hematology and Oncology

## 2019-04-06 ENCOUNTER — Other Ambulatory Visit: Payer: Self-pay

## 2019-04-06 ENCOUNTER — Encounter: Payer: Self-pay | Admitting: Oncology

## 2019-04-06 DIAGNOSIS — Z923 Personal history of irradiation: Secondary | ICD-10-CM | POA: Insufficient documentation

## 2019-04-06 DIAGNOSIS — N951 Menopausal and female climacteric states: Secondary | ICD-10-CM | POA: Diagnosis not present

## 2019-04-06 DIAGNOSIS — R0789 Other chest pain: Secondary | ICD-10-CM

## 2019-04-06 DIAGNOSIS — C530 Malignant neoplasm of endocervix: Secondary | ICD-10-CM

## 2019-04-06 DIAGNOSIS — Z791 Long term (current) use of non-steroidal anti-inflammatories (NSAID): Secondary | ICD-10-CM | POA: Insufficient documentation

## 2019-04-06 DIAGNOSIS — C539 Malignant neoplasm of cervix uteri, unspecified: Secondary | ICD-10-CM | POA: Diagnosis not present

## 2019-04-06 DIAGNOSIS — Z79899 Other long term (current) drug therapy: Secondary | ICD-10-CM | POA: Insufficient documentation

## 2019-04-06 MED ORDER — ESTRADIOL 0.1 MG/GM VA CREA: 1.0000 | TOPICAL_CREAM | VAGINAL | 12 refills | Status: DC

## 2019-04-06 NOTE — Assessment & Plan Note (Signed)
She has intermittent chest wall discomfort at the previous port insertion site Examination is benign and I reassured the patient

## 2019-04-06 NOTE — Assessment & Plan Note (Signed)
Clinically, she has no signs or symptoms to suggest cancer recurrence I reinforced to the patient signs and symptoms watch out for such as pelvic pain, abnormal vaginal bleeding, etc. She has appointment to see radiation oncologist next month for further follow-up and pelvic exam

## 2019-04-06 NOTE — Progress Notes (Signed)
[place] OFFICE PROGRESS NOTE  Patient Care Team: Lucretia Kern, DO as PCP - General (Family Medicine)  ASSESSMENT & PLAN:  Cervical cancer [facility]) Clinically, she has no signs or symptoms to suggest cancer recurrence I reinforced to the patient signs and symptoms watch out for such as pelvic pain, abnormal vaginal bleeding, etc. She has appointment to see radiation oncologist next month for further follow-up and pelvic exam  Vaginal dryness, menopausal She has significant vaginal dryness and discomfort likely secondary to menopausal symptoms and history of radiation She does not use her vaginal dilator on a regular basis and I reinforced the importance of using that on a regular basis Per discussion with her GYN surgeon, I also recommend topical estrogen cream and she is in agreement to try  Chest wall discomfort She has intermittent chest wall discomfort at the previous port insertion site Examination is benign and I reassured the patient   No orders of the defined types were placed in this encounter.   INTERVAL HISTORY: Please see below for problem oriented charting. She is seen urgently due to concern about possible infection at the port incision site Her port was removed in July She was prescribed a course of antibiotics treatment She complained of abnormal palpable lump near the clavicle region She is concerned about infection No recent fever or chills She also have urinary discomfort on an intermittent basis and vaginal dryness No recent vaginal bleeding or pelvic pain or changes in bowel habits  SUMMARY OF ONCOLOGIC HISTORY: Oncology History  Cervical cancer ([place])  02/20/2018 Initial Diagnosis   She presented with postmenopausal bleeding   03/10/2018 Pathology Results   Endometrium, biopsy - SQUAMOUS CELL CARCINOMA. - SEE COMMENT.   03/17/2018 Imaging   US pelvis: Endometrium measures 7 mm. In the setting of post-menopausal bleeding, endometrial  sampling is indicated to exclude carcinoma   03/24/2018 Pathology Results   1. Cervix, biopsy, 12 o'clock - INVASIVE MODERATELY DIFFERENTIATED SQUAMOUS CELL CARCINOMA. SEE NOTE. 2. Cervix, biopsy, 3 o'clock - HIGH GRADE SQUAMOUS INTRAEPITHELIAL LESION (CIN-III, HIGH GRADE DYSPLASIA). SEE NOTE. 3. Cervix, biopsy, 9 o'clock - INVASIVE MODERATELY DIFFERENTIATED SQUAMOUS CELL CARCINOMA. SEE NOTE. 4. Cervix, biopsy, 6 o'clock - HIGH GRADE SQUAMOUS INTRAEPITHELIAL LESION (CIN-III, HIGH GRADE DYSPLASIA). SEE NOTE.   03/24/2018 Procedure   Pre-operative Diagnosis:  Suspect SCCa Cervix based on endometrial biopsy  Post-operative Diagnosis:  Stage IB1 (FIGO 2018) SCCa Cervix  Operation:  Exam under anesthesia Cervical biopsies  Operative Findings:  Anterior cervical lip with gross lesion from ~10-12:00. Remainder of cervix grossly normal. Estimate lesion to be <2cm. No vaginal or parametrial involvement.   03/30/2018 Imaging   CT scan of chest, abdomen and pelvis: 1. Isolated low-density structure lateral to the descending colon. Differential considerations include isolated peritoneal implant/metastasis, GI duplication/mesenteric cyst, or postoperative collection such as seroma (clinical history only describes prior Caesarean section). Consider further evaluation with PET. 2. Otherwise, no evidence of metastatic disease in the chest, abdomen, or pelvis.   04/04/2018 PET scan   1. Prominent hypermetabolism within the cervix, consistent with known cervical cancer. No parametrial extension of tumor or abdominopelvic nodal metastatic disease. 2. The previously demonstrated low-density structure posterior to the descending colon demonstrates no hypermetabolic activity and is likely an incidental duplication/mesenteric cyst. 3. Focal hypermetabolic activity within a single right neck lymph node (level 1B). This is unlikely to be related to the patient's cervical cancer. No hypermetabolism is seen  within the pharyngeal mucosal space. ENT evaluation should  be considered, especially if the patient has risk factors for head and neck malignancy.   04/28/2018 Pathology Results   1. Lymph nodes, regional resection, right pelvic - EIGHT BENIGN LYMPH NODES (0/8). 2. Lymph nodes, regional resection, left pelvic - SEVEN BENIGN LYMPH NODES (0/7). 3. Uterus, cervix and bilateral fallopian tubes, upper vagina CERVIX: - INVASIVE POORLY DIFFERENTIATED SQUAMOUS CELL CARCINOMA, 4.5 X 2.6 X 1.0 CM. - MARGINS NOT INVOLVED. - LYMPHOVASCULAR INVOLVEMENT BY TUMOR. - METASTATIC CARCINOMA IN TWO OF SIX PARACERVICAL LYMPH NODES (2/6). BENIGN ENDOMETRIUM AND MYOMETRIUM SEE ONCOLOGY TABLE. 4. Vagina, biopsy, anterior vaginal margin - SQUAMOUS MUCOSA WITH SLIGHT INFLAMMATION. - NO EVIDENCE OF INVASIVE CARCINOMA. Microscopic Comment 3. UTERINE CERVIX: Resection  Procedure: Hysterectomy with right and left pelvic regional lymph nodes and anterior vaginal margin. Tumor Size: 4.5 x 2.6 x 1.0 cm. Histologic Type: Squamous cell carcinoma Histologic Grade: Poorly differentiated Stromal Invasion: Yes Depth of stromal invasion (millimeters): 10 mm Horizontal extent longitudinal/length (if applicable#) (millimeters): 25 mm Horizontal extent circumferential/width (if applicable#) (millimeters): 45 mm Other Tissue/ Organ: No Margins: Free of tumor Lymphovascular: Present Regional Lymph Nodes: Two positive for tumor cells (select all that apply) Total Number of Lymph Nodes Examined: Twenty-one Number of Sentinel Nodes Examined (if applicable): 0 Pathologic Stage Classification (pTNM, AJCC 8th Edition): pT1b2, pN1 Ancillary Studies: N/A Representative Tumor Block: 3C, 3D, 3E, 28F, 3J and 3K Comment(s): The tumor is a poorly differentiated invasive squamous cell carcinoma which is up to 1.0 cm (10 mm) in thickness. There is lymphovascular space involvement within the wall of the cervix and in the paracervical  connective tissue. The margins of the specimen are not involved by carcinoma (JDP:kh 04/29/18)   04/28/2018 Surgery   Surgeon: Donaciano Eva  Pre-operative Diagnosis: stage IB2 cervical cancer  Post-operative Diagnosis: same  Operation: Robotic-assisted type III radical laparoscopic hysterectomy with bilateral salpingectomy and bilateral pelvic lymphadenectomy  Surgeon: Donaciano Eva  Operative Findings:  : 3.5cm tumor replacing the anterior and right cervix. No gross parametrial involvement.         Specimens: left and right pelvic nodes, uterus with cervix, bilateral tubes and upper vagina. Anterior vaginal margin with marking stitch at 12 o'clock of new true distal anterior vaginal margin         Complications:  None; patient tolerated the procedure well.            05/11/2018 Cancer Staging   Staging form: Cervix Uteri, AJCC 8th Edition - Pathologic stage from 05/11/2018: Stage III (pT3, pN1, cM0) - Signed by Heath Lark, MD on 05/11/2018   05/12/2018 Imaging   1. Soft tissue inflammation about the bladder could reflect cystitis. Would correlate with the patient's symptoms. 2. Small to moderate amount of free fluid within the pelvis, of uncertain significance. This may simply be postoperative in nature, though if urine output from the Foley catheter decreases, further evaluation could be considered to exclude urine leak.   05/29/2018 Procedure   Placement of a CT injectable subcutaneous port device.   06/12/2018 - 07/03/2018 Chemotherapy   She received weekly cisplatin with radiation x 4 doses. She is not able to receive any more chemo due to severe pancytopenia   10/21/2018 PET scan   1. Status post hysterectomy. No findings identified to suggest residual or recurrent FDG avid tumor within the pelvis. No evidence for abdominopelvic nodal metastasis. 2. Persistence of focal hypermetabolic activity within subcentimeter right level 1B lymph node. No  hypermetabolism is seen within the pharyngeal mucosal  space. ENT evaluation should be considered, especially if the patient has risk factors for head and neck malignancy.   11/30/2018 Procedure   Successful right IJ vein Port-A-Cath explant.     REVIEW OF SYSTEMS:   Constitutional: Denies fevers, chills or abnormal weight loss Eyes: Denies blurriness of vision Ears, nose, mouth, throat, and face: Denies mucositis or sore throat Respiratory: Denies cough, dyspnea or wheezes Cardiovascular: Denies palpitation, chest discomfort or lower extremity swelling Gastrointestinal:  Denies nausea, heartburn or change in bowel habits Skin: Denies abnormal skin rashes Lymphatics: Denies new lymphadenopathy or easy bruising Neurological:Denies numbness, tingling or new weaknesses Behavioral/Psych: Mood is stable, no new changes  All other systems were reviewed with the patient and are negative.  I have reviewed the past medical history, past surgical history, social history and family history with the patient and they are unchanged from previous note.  ALLERGIES:  has No Known Allergies.  MEDICATIONS:  Current Outpatient Medications  Medication Sig Dispense Refill  . cholecalciferol (VITAMIN D3) 25 MCG (1000 UT) tablet Take 1,000 Units by mouth daily.    Derrill Memo ON 04/07/2019] estradiol (ESTRACE VAGINAL) 0.1 MG/GM vaginal cream Place 1 Applicatorful vaginally 3 (three) times a week. 42.5 g 12  . Glucosamine-Chondroitin (MOVE FREE PO) Take 1 tablet by mouth daily.     Marland Kitchen ibuprofen (ADVIL,MOTRIN) 600 MG tablet Take 1 tablet (600 mg total) by mouth every 6 (six) hours as needed for mild pain. 30 tablet 1   No current facility-administered medications for this visit.     PHYSICAL EXAMINATION: ECOG PERFORMANCE STATUS: 1 - Symptomatic but completely ambulatory  Vitals:   04/06/19 1217  BP: (!) 131/97  Pulse: 89  Resp: 17  Temp: 98 F (36.7 C)  SpO2: 99%   Filed Weights   04/06/19 1217   Weight: 140 lb 3.2 oz (63.6 kg)    GENERAL:alert, no distress and comfortable Musculoskeletal:no cyanosis of digits and no clubbing.  Well-healed surgical scar at previous port site NEURO: alert & oriented x 3 with fluent speech, no focal motor/sensory deficits  LABORATORY DATA:  I have reviewed the data as listed    Component Value Date/Time   NA 141 10/21/2018 0934   K 4.5 10/21/2018 0934   CL 108 10/21/2018 0934   CO2 25 10/21/2018 0934   GLUCOSE 85 10/21/2018 0934   BUN 11 10/21/2018 0934   CREATININE 0.83 10/21/2018 0934   CALCIUM 9.3 10/21/2018 0934   PROT 6.7 10/21/2018 0934   ALBUMIN 4.0 10/21/2018 0934   AST 26 10/21/2018 0934   ALT 25 10/21/2018 0934   ALKPHOS 53 10/21/2018 0934   BILITOT 0.6 10/21/2018 0934   GFRNONAA >60 10/21/2018 0934   GFRAA >60 10/21/2018 0934    No results found for: SPEP, UPEP  Lab Results  Component Value Date   WBC 3.6 (L) 01/29/2019   NEUTROABS 2.5 01/29/2019   HGB 13.1 01/29/2019   HCT 38.5 01/29/2019   MCV 88.1 01/29/2019   PLT 161 01/29/2019      Chemistry      Component Value Date/Time   NA 141 10/21/2018 0934   K 4.5 10/21/2018 0934   CL 108 10/21/2018 0934   CO2 25 10/21/2018 0934   BUN 11 10/21/2018 0934   CREATININE 0.83 10/21/2018 0934      Component Value Date/Time   CALCIUM 9.3 10/21/2018 0934   ALKPHOS 53 10/21/2018 0934   AST 26 10/21/2018 0934   ALT 25 10/21/2018 0934   BILITOT  0.6 10/21/2018 0934       All questions were answered. The patient knows to call the clinic with any problems, questions or concerns. No barriers to learning was detected.  I spent 15 minutes counseling the patient face to face. The total time spent in the appointment was 20 minutes and more than 50% was on counseling and review of test results  Heath Lark, MD 04/06/2019 1:35 PM

## 2019-04-06 NOTE — Assessment & Plan Note (Signed)
She has significant vaginal dryness and discomfort likely secondary to menopausal symptoms and history of radiation She does not use her vaginal dilator on a regular basis and I reinforced the importance of using that on a regular basis Per discussion with her GYN surgeon, I also recommend topical estrogen cream and she is in agreement to try

## 2019-04-06 NOTE — Telephone Encounter (Signed)
Called Holly Pena and advised her of appointment with Dr. Alvy Bimler today at 12:30. Advised her to arrive early to check in.

## 2019-04-13 ENCOUNTER — Encounter: Payer: Self-pay | Admitting: Gynecologic Oncology

## 2019-04-14 ENCOUNTER — Telehealth: Payer: Self-pay

## 2019-04-14 NOTE — Telephone Encounter (Signed)
Holly Pena contacted Dr. Denman George and [name] via my chart.  She c/o "dampness in her underwear".  I called Holly Pena she states that the discharge is clear.  I advised her to wear a pantyliner to monitor.  I told her that if the discharge is brown or red that would be concerning.  She also states she experiences intermittent back pain  Occurring several days a week.  She says it is resolved after urinating.  She denies burning and fever. She does not feel like she is emptying her bladder.  Per Holly John, NP pt to resume post void catheterizations. Holly Pena does have an appointment with Dr. Sondra Come 04/26/2019.  Holly Pena verbalized understanding.

## 2019-04-26 ENCOUNTER — Encounter: Payer: Self-pay | Admitting: Radiation Oncology

## 2019-04-26 ENCOUNTER — Ambulatory Visit
Admission: RE | Admit: 2019-04-26 | Discharge: 2019-04-26 | Disposition: A | Discharge status: Home / Self Care | Payer: BC Managed Care – PPO | Source: Ambulatory Visit | Attending: Radiation Oncology | Admitting: Radiation Oncology

## 2019-04-26 ENCOUNTER — Other Ambulatory Visit: Payer: Self-pay

## 2019-04-26 VITALS — BP 93/74 | HR 67 | Temp 98.2°F | Resp 18 | Wt 138.8 lb

## 2019-04-26 DIAGNOSIS — C530 Malignant neoplasm of endocervix: Secondary | ICD-10-CM

## 2019-04-26 NOTE — Progress Notes (Addendum)
Pt presents today for f/u with Dr. Sondra Come. Pt denies pain today but reports occasional pain in groin and low back. Pt denies dysuria/hematuria.  Pt reports incomplete bladder emptying. Nocturia 2-3 times per night. Pt reports vaginal dryness and is using coconut oil instead of estrace. Pt denies vaginal bleeding/discharge. Pt reports a "wet" feeling but does not see anything on underwear. Pt denies rectal bleeding, diarrhea/constipation. Pt reports occasional abdominal bloating, denies N/V.  BP 93/74 (BP Location: Left Arm, Patient Position: Sitting, Cuff Size: Normal)   Pulse 67   Temp 98.2 F (36.8 C)   Resp 18   Wt 138 lb 12.8 oz (63 kg)   LMP 02/20/2018 Comment: no period in 2 years  SpO2 99%   BMI 24.59 kg/m   Wt Readings from Last 3 Encounters:  04/26/19 138 lb 12.8 oz (63 kg)  04/06/19 140 lb 3.2 oz (63.6 kg)  02/18/19 141 lb 12.8 oz (64.3 kg)   Loma Sousa, RN BSN

## 2019-04-26 NOTE — Progress Notes (Signed)
Radiation Oncology         [phone] ________________________________  Name: Holly Pena MRN: [id number]  Date: 04/26/2019  DOB: 1965-05-16  Follow-Up Visit Note  CC: Lucretia Kern, DO  Everitt Amber, MD    ICD-10-CM   1. Malignant neoplasm of endocervix [facility])  C53.0     Diagnosis: Stage III-C(pT1b2, pN1)poorly differentiated squamous cell carcinoma of the cervix  Interval Since Last Radiation: 9 months and four days.  Radiation treatment dates:   06/11/18-07/21/18  Site/dose:   1. pelvis / 25 fractions x 1.8 Gy for a total of 45 Gy                      2. Central pelvis Boost / 3 fractions x 1.8 Gy for a total of 5.4 Gy  Narrative: The patient returns today for routine follow-up. She is doing well overall. Post-treatment restaging PET scan on 10/21/2018 did not show any findings of residual or recurrent disease in the pelvis. However, it did show persistence of a focal hypermetabolic activity and a sub-centimeter neck node. She was evaluated by ENT and no pharyngeal lesion was appreciated.  She had a PAP smear on 01/28/2019, which was negative for intraepithelial lesions or malignancy. Patient last followed-up with Dr. Denman George on 01/29/2019, at which time she was doing well.  She has also been under the care of Dr. Alvy Bimler, whom she last saw on 04/06/2019. She had no signs or symptoms to suggest recurrence at that time. Patient did report that she had not been using her vaginal dilator on a regular basis.  I discussed the importance of using this equipment as part of her overall therapy.  On review of systems, patient reports no pelvic pain but does report vaginal dryness. Patient denies vaginal bleeding  Patient was given a prescription for Estrace vaginal cream the patient filled this medication.  She has been using coconut oil instead with good results.                       ALLERGIES:  has No Known Allergies.  Meds: Current Outpatient Medications  Medication Sig Dispense Refill   . cholecalciferol (VITAMIN D3) 25 MCG (1000 UT) tablet Take 1,000 Units by mouth daily.    . Glucosamine-Chondroitin (MOVE FREE PO) Take 1 tablet by mouth daily.     Marland Kitchen ibuprofen (ADVIL,MOTRIN) 600 MG tablet Take 1 tablet (600 mg total) by mouth every 6 (six) hours as needed for mild pain. 30 tablet 1  . estradiol (ESTRACE VAGINAL) 0.1 MG/GM vaginal cream Place 1 Applicatorful vaginally 3 (three) times a week. (Patient not taking: Reported on 04/26/2019) 42.5 g 12   No current facility-administered medications for this encounter.    Physical Findings: The patient is in no acute distress. Patient is alert and oriented.  weight is 138 lb 12.8 oz (63 kg). Her temperature is 98.2 F (36.8 C). Her blood pressure is 93/74 and her pulse is 67. Her respiration is 18 and oxygen saturation is 99%.   Lungs are clear to auscultation bilaterally. Heart has regular rate and rhythm. No palpable cervical, supraclavicular, or axillary adenopathy. Abdomen soft, non-tender, normal bowel sounds.  On pelvic examination the external genitalia were unremarkable. A speculum exam was performed. There are no mucosal lesions noted in the vaginal vault. On bimanual there were no pelvic masses appreciated.  Patient seems to be tolerating the pelvic exam little better with less anxiety  Lab Findings: Lab Results  Component Value Date   WBC 3.6 (L) 01/29/2019   HGB 13.1 01/29/2019   HCT 38.5 01/29/2019   MCV 88.1 01/29/2019   PLT 161 01/29/2019    Radiographic Findings: No results found.  Impression:  Stage III-C(pT1b2, pN1)poorly differentiated squamous cell carcinoma of the cervix.  The patient is recovering from the effects of radiation. No evidence of recurrence on clinical exam today.   Plan: Routine follow-up in 6 months. Patient will follow up with Dr. Denman George in 3 month. She will also continue to follow up with Dr. Alvy Bimler.  Routine follow-up in radiation oncology in 6  months.  ____________________________________  Blair Promise, PhD, MD  This document serves as a record of services personally performed by Gery Pray, MD. It was created on his behalf by Clerance Lav, a trained medical scribe. The creation of this record is based on the scribe's personal observations and the provider's statements to them. This document has been checked and approved by the attending provider.

## 2019-04-26 NOTE — Patient Instructions (Signed)
Coronavirus (COVID-19) Are you at risk?  Are you at risk for the Coronavirus (COVID-19)?  To be considered HIGH RISK for Coronavirus (COVID-19), you have to meet the following criteria:  . Traveled to [place], [place], [place], [place] or [place]; or in the [place] to [place], [place], [place], or [place]; and have fever, cough, and shortness of breath within the last 2 weeks of travel OR . Been in close contact with a person diagnosed with COVID-19 within the last 2 weeks and have fever, cough, and shortness of breath . IF YOU DO NOT MEET THESE CRITERIA, YOU ARE CONSIDERED LOW RISK FOR COVID-19.  What to do if you are HIGH RISK for COVID-19?  . If you are having a medical emergency, call 911. . Seek medical care right away. Before you go to a doctor's office, urgent care or emergency department, call ahead and tell them about your recent travel, contact with someone diagnosed with COVID-19, and your symptoms. You should receive instructions from your physician's office regarding next steps of care.  . When you arrive at healthcare provider, tell the healthcare staff immediately you have returned from visiting [place], [place], [place], [place] or [place]; or traveled in the [place] to [place], [place], [place], or [place]; in the last two weeks or you have been in close contact with a person diagnosed with COVID-19 in the last 2 weeks.   . Tell the health care staff about your symptoms: fever, cough and shortness of breath. . After you have been seen by a medical provider, you will be either: o Tested for (COVID-19) and discharged home on quarantine except to seek medical care if symptoms worsen, and asked to  - Stay home and avoid contact with others until you get your results (4-5 days)  - Avoid travel on public transportation if possible (such as bus, train, or airplane) or o Sent to the Emergency Department by EMS for evaluation, COVID-19 testing, and possible  admission depending on your condition and test results.  What to do if you are LOW RISK for COVID-19?  Reduce your risk of any infection by using the same precautions used for avoiding the common cold or flu:  . Wash your hands often with soap and warm water for at least 20 seconds.  If soap and water are not readily available, use an alcohol-based hand sanitizer with at least 60% alcohol.  . If coughing or sneezing, cover your mouth and nose by coughing or sneezing into the elbow areas of your shirt or coat, into a tissue or into your sleeve (not your hands). . Avoid shaking hands with others and consider head nods or verbal greetings only. . Avoid touching your eyes, nose, or mouth with unwashed hands.  . Avoid close contact with people who are sick. . Avoid places or events with large numbers of people in one location, like concerts or sporting events. . Carefully consider travel plans you have or are making. . If you are planning any travel outside or inside the [place], visit the CDC's Travelers' Health webpage for the latest health notices. . If you have some symptoms but not all symptoms, continue to monitor at home and seek medical attention if your symptoms worsen. . If you are having a medical emergency, call 911.   ADDITIONAL HEALTHCARE OPTIONS FOR PATIENTS  [facility] / e-Visit: [email or web address]         [facility]: [phone]  [facility]: [phone]                   [facility]: [phone]   

## 2019-04-30 ENCOUNTER — Other Ambulatory Visit: Payer: Self-pay | Admitting: Otolaryngology

## 2019-04-30 DIAGNOSIS — R590 Localized enlarged lymph nodes: Secondary | ICD-10-CM

## 2019-05-23 ENCOUNTER — Ambulatory Visit: Payer: PRIVATE HEALTH INSURANCE | Admitting: Radiation Oncology

## 2019-05-26 ENCOUNTER — Telehealth: Payer: Self-pay | Admitting: *Deleted

## 2019-05-26 NOTE — Telephone Encounter (Signed)
MAILED PATIENT AN APPT. CARD WITH DR. Denman George APPT. ON MARCH 16 - ARRIVAL TIME- 2 PM (MAILED APPT. CARD ON 05-26-19)

## 2019-05-26 NOTE — Telephone Encounter (Signed)
Scheduled the patient for an appointment in March. Enid Derry in radiation will call the patient

## 2019-05-30 ENCOUNTER — Encounter: Payer: Self-pay | Admitting: Gynecologic Oncology

## 2019-06-01 ENCOUNTER — Encounter: Payer: Self-pay | Admitting: Gynecologic Oncology

## 2019-06-02 ENCOUNTER — Encounter: Payer: Self-pay | Admitting: Gynecologic Oncology

## 2019-06-03 ENCOUNTER — Telehealth: Payer: Self-pay | Admitting: *Deleted

## 2019-06-03 NOTE — Telephone Encounter (Signed)
Left the patient a message to a call the office back. Patient called back and ask to have it emailed to her. Letter was sent via email

## 2019-06-21 ENCOUNTER — Telehealth: Payer: Self-pay | Admitting: Family Medicine

## 2019-06-21 NOTE — Telephone Encounter (Signed)
Called to set up TOC per Maudie Mercury, Left VM

## 2019-06-21 NOTE — Telephone Encounter (Signed)
-----   Message from Lucretia Kern, DO sent at 04/20/2019 11:34 AM EST ----- Looks like patient needs in office PCP. Please see who she would like to see. I am happy to see her for any virtual visits if she sticks with a [facility] PCP. Thanks. ----- Message ----- From: Heath Lark, MD Sent: 04/06/2019   1:36 PM EST To: Lucretia Kern, DO

## 2019-07-27 ENCOUNTER — Other Ambulatory Visit: Payer: Self-pay

## 2019-07-27 ENCOUNTER — Encounter: Payer: Self-pay | Admitting: Gynecologic Oncology

## 2019-07-27 ENCOUNTER — Inpatient Hospital Stay: Payer: BLUE CROSS/BLUE SHIELD | Attending: Gynecologic Oncology | Admitting: Gynecologic Oncology

## 2019-07-27 VITALS — BP 112/82 | HR 69 | Temp 98.3°F | Resp 17 | Ht 63.0 in | Wt 134.8 lb

## 2019-07-27 DIAGNOSIS — Z8541 Personal history of malignant neoplasm of cervix uteri: Secondary | ICD-10-CM

## 2019-07-27 DIAGNOSIS — Z923 Personal history of irradiation: Secondary | ICD-10-CM | POA: Diagnosis not present

## 2019-07-27 DIAGNOSIS — Z9079 Acquired absence of other genital organ(s): Secondary | ICD-10-CM | POA: Diagnosis not present

## 2019-07-27 DIAGNOSIS — Z90722 Acquired absence of ovaries, bilateral: Secondary | ICD-10-CM | POA: Diagnosis not present

## 2019-07-27 DIAGNOSIS — Z90710 Acquired absence of both cervix and uterus: Secondary | ICD-10-CM | POA: Diagnosis not present

## 2019-07-27 DIAGNOSIS — Z9221 Personal history of antineoplastic chemotherapy: Secondary | ICD-10-CM

## 2019-07-27 DIAGNOSIS — C530 Malignant neoplasm of endocervix: Secondary | ICD-10-CM

## 2019-07-27 NOTE — Progress Notes (Signed)
Holly Pena at [facility]   Progress Note: Established Patient Follow-Up Visit   Consult was originally requested by Dr. Mora Bellman for cervical cancer  Chief Complaint  Patient presents with  . Malignant neoplasm of endocervix ([place])    Follow up    GYN Oncologic Summary 1. Stage IIIC SCCa Cervix S/p radical hysterectomy, BSO, lymphadenectomy on 04/28/18  HPI: [name]  is a very nice 54 y.o.  P2  Interval History  She reports no residual toxicities of therapy.  She is using her vaginal dilator but denied engaging in intercourse because she is afraid.  She reported a breast mole and a scalp nodule.  Oncologic Course She went through menopause ~2017. On February 20, 2018 she noted post-menopausal bleeding that lasted ~ 2 weeks (intermittent). She presented to her PCP who referred her on to Dr. Elly Modena. On exam she was noted to have a bulky and friable cervix. A Pap and EMB were performed.   A TVUS was ordered and done 03/17/18 revealing a normal uterus with 53mm EM lining. No adnexal masses.  Pap and EMB revealed squamous cell CA.  She is thus referred for management and recommendations.  Her last pelvic/Pap was at the time of her last pregnancy ~16 years ago  Dr Gerarda Fraction took her to the operating room for an EUA and cervical biopsies due to intolerance of examination in the office. Findings as noted:  Anterior cervical lip with gross lesion from ~10-12:00. Remainder of cervix grossly normal. Estimate lesion to be <2cm. No vaginal or parametrial involvement.   Grossly the remainder of the cervix appeared normal. It was firm on palpation but no fungating lesions. Pathology confirmed what the Pap/EMB showed 1. Cervix, biopsy, 12 o'clock - INVASIVE MODERATELY DIFFERENTIATED SQUAMOUS CELL CARCINOMA. SEE NOTE. 2. Cervix, biopsy, 3 o'clock - HIGH GRADE SQUAMOUS INTRAEPITHELIAL LESION (CIN-III, HIGH GRADE DYSPLASIA). SEE NOTE. 3. Cervix,  biopsy, 9 o'clock - INVASIVE MODERATELY DIFFERENTIATED SQUAMOUS CELL CARCINOMA. SEE NOTE. 4. Cervix, biopsy, 6 o'clock - HIGH GRADE SQUAMOUS INTRAEPITHELIAL LESION (CIN-III, HIGH GRADE DYSPLASIA). SEE NOTE. Diagnosis Note 1. 2, 3 and 4. Dr. Melina Copa has reviewed this case and concurs with the above interpretation. (NDK:gt, 03/25/18) [name] MD   Dr Gerarda Fraction staged her clinically as a Stage IB1 (FIGO 2018) SCCa Cervix and ordered a PET/CT. Because Dr Gerarda Fraction stated that the tumor was <2cm, she counseled the patient that she was a candidate for either MIS or open approach. The patient elected for MIS approach and therefore elected to have surgery with Dr Denman George as Dr Gerarda Fraction does not perform radical hysterectomy via a MIS route.   PET/CT resulted with no apparent metastatic disease (though there was a mildly PET avid node in the neck felt to be unrelated). She had a 3.5cm area of increased avidity in the cervix.   The patient elected for robotic assisted hysterectomy, BSO, lymphadenectomy.   On April 28, 2018 she underwent robotic assisted type III radical laparoscopic hysterectomy with bilateral salpingectomy and bilateral pelvic lymphadenectomy.  Surgery was uncomplicated.  Operative findings is significant for 3.5 cm tumor placed in the anterior and right cervix with no gross parametrial involvement.  Pathology revealed a 4.5 x 2.6 x 1 cm invasive, poorly differentiated squamous cell carcinoma.  This resided in the cervix.  Margins were not involved.  There was lymphovascular involvement by the tumor.  2 of 6 paracervical lymph nodes were positive for metastatic carcinoma.  The right and left pelvic  lymphadenectomy specimens were negative for metastatic disease.  She was determined to have high risk features for recurrence with deep cervical stromal involvement, large sized tumor greater than 4 cm, lymphovascular space invasion, and metastatic involvement of lymph nodes.  In accordance with  NCCN guidelines she was recommended adjuvant radiation with chemosensitization with cisplatin.  Postoperatively she was seen in the office for evaluation for a voiding trial.  She was initially unable to void urine spontaneously after Foley removal and required replacement and eventual intermittent self catheterization.   She went on to complete chemoradiation for advanced stage cervical cancer between January 30 and July 21, 2018.  She received a total of 45 Gray external beam radiation to the pelvis and 25 fractions, with a central pelvic boost of 3 fractions for a total of 5.4 Gray to the central pelvis.  She received weekly radiosensitizing cisplatin chemotherapy.  During therapy she developed neutropenia.  Post treatment restaging PET scan on October 21, 2018 which did not show any findings for residual or recurrent disease in the pelvis.  It did show persistence of a focal hypermetabolic activity and a subcentimeter neck node. ENT evaluation was negative for a pharyngeal lesion.   Imported EPIC Oncologic History:  Oncology History  Cervical cancer ([place])  02/20/2018 Initial Diagnosis   She presented with postmenopausal bleeding   03/10/2018 Pathology Results   Endometrium, biopsy - SQUAMOUS CELL CARCINOMA. - SEE COMMENT.   03/17/2018 Imaging   US pelvis: Endometrium measures 7 mm. In the setting of post-menopausal bleeding, endometrial sampling is indicated to exclude carcinoma   03/24/2018 Pathology Results   1. Cervix, biopsy, 12 o'clock - INVASIVE MODERATELY DIFFERENTIATED SQUAMOUS CELL CARCINOMA. SEE NOTE. 2. Cervix, biopsy, 3 o'clock - HIGH GRADE SQUAMOUS INTRAEPITHELIAL LESION (CIN-III, HIGH GRADE DYSPLASIA). SEE NOTE. 3. Cervix, biopsy, 9 o'clock - INVASIVE MODERATELY DIFFERENTIATED SQUAMOUS CELL CARCINOMA. SEE NOTE. 4. Cervix, biopsy, 6 o'clock - HIGH GRADE SQUAMOUS INTRAEPITHELIAL LESION (CIN-III, HIGH GRADE DYSPLASIA). SEE NOTE.   03/24/2018 Procedure   Pre-operative  Diagnosis:  Suspect SCCa Cervix based on endometrial biopsy  Post-operative Diagnosis:  Stage IB1 (FIGO 2018) SCCa Cervix  Operation:  Exam under anesthesia Cervical biopsies  Operative Findings:  Anterior cervical lip with gross lesion from ~10-12:00. Remainder of cervix grossly normal. Estimate lesion to be <2cm. No vaginal or parametrial involvement.   03/30/2018 Imaging   CT scan of chest, abdomen and pelvis: 1. Isolated low-density structure lateral to the descending colon. Differential considerations include isolated peritoneal implant/metastasis, GI duplication/mesenteric cyst, or postoperative collection such as seroma (clinical history only describes prior Caesarean section). Consider further evaluation with PET. 2. Otherwise, no evidence of metastatic disease in the chest, abdomen, or pelvis.   04/04/2018 PET scan   1. Prominent hypermetabolism within the cervix, consistent with known cervical cancer. No parametrial extension of tumor or abdominopelvic nodal metastatic disease. 2. The previously demonstrated low-density structure posterior to the descending colon demonstrates no hypermetabolic activity and is likely an incidental duplication/mesenteric cyst. 3. Focal hypermetabolic activity within a single right neck lymph node (level 1B). This is unlikely to be related to the patient's cervical cancer. No hypermetabolism is seen within the pharyngeal mucosal space. ENT evaluation should be considered, especially if the patient has risk factors for head and neck malignancy.   04/28/2018 Pathology Results   1. Lymph nodes, regional resection, right pelvic - EIGHT BENIGN LYMPH NODES (0/8). 2. Lymph nodes, regional resection, left pelvic - SEVEN BENIGN LYMPH NODES (0/7). 3. Uterus, cervix and bilateral  fallopian tubes, upper vagina CERVIX: - INVASIVE POORLY DIFFERENTIATED SQUAMOUS CELL CARCINOMA, 4.5 X 2.6 X 1.0 CM. - MARGINS NOT INVOLVED. - LYMPHOVASCULAR INVOLVEMENT BY  TUMOR. - METASTATIC CARCINOMA IN TWO OF SIX PARACERVICAL LYMPH NODES (2/6). BENIGN ENDOMETRIUM AND MYOMETRIUM SEE ONCOLOGY TABLE. 4. Vagina, biopsy, anterior vaginal margin - SQUAMOUS MUCOSA WITH SLIGHT INFLAMMATION. - NO EVIDENCE OF INVASIVE CARCINOMA. Microscopic Comment 3. UTERINE CERVIX: Resection  Procedure: Hysterectomy with right and left pelvic regional lymph nodes and anterior vaginal margin. Tumor Size: 4.5 x 2.6 x 1.0 cm. Histologic Type: Squamous cell carcinoma Histologic Grade: Poorly differentiated Stromal Invasion: Yes Depth of stromal invasion (millimeters): 10 mm Horizontal extent longitudinal/length (if applicable#) (millimeters): 25 mm Horizontal extent circumferential/width (if applicable#) (millimeters): 45 mm Other Tissue/ Organ: No Margins: Free of tumor Lymphovascular: Present Regional Lymph Nodes: Two positive for tumor cells (select all that apply) Total Number of Lymph Nodes Examined: Twenty-one Number of Sentinel Nodes Examined (if applicable): 0 Pathologic Stage Classification (pTNM, AJCC 8th Edition): pT1b2, pN1 Ancillary Studies: N/A Representative Tumor Block: 3C, 3D, 3E, 4F, 3J and 3K Comment(s): The tumor is a poorly differentiated invasive squamous cell carcinoma which is up to 1.0 cm (10 mm) in thickness. There is lymphovascular space involvement within the wall of the cervix and in the paracervical connective tissue. The margins of the specimen are not involved by carcinoma (JDP:kh 04/29/18)   04/28/2018 Surgery   Surgeon: Donaciano Eva  Pre-operative Diagnosis: stage IB2 cervical cancer  Post-operative Diagnosis: same  Operation: Robotic-assisted type III radical laparoscopic hysterectomy with bilateral salpingectomy and bilateral pelvic lymphadenectomy  Surgeon: Donaciano Eva  Operative Findings:  : 3.5cm tumor replacing the anterior and right cervix. No gross parametrial involvement.         Specimens: left and right  pelvic nodes, uterus with cervix, bilateral tubes and upper vagina. Anterior vaginal margin with marking stitch at 12 o'clock of new true distal anterior vaginal margin         Complications:  None; patient tolerated the procedure well.            05/11/2018 Cancer Staging   Staging form: Cervix Uteri, AJCC 8th Edition - Pathologic stage from 05/11/2018: Stage III (pT3, pN1, cM0) - Signed by Heath Lark, MD on 05/11/2018   05/12/2018 Imaging   1. Soft tissue inflammation about the bladder could reflect cystitis. Would correlate with the patient's symptoms. 2. Small to moderate amount of free fluid within the pelvis, of uncertain significance. This may simply be postoperative in nature, though if urine output from the Foley catheter decreases, further evaluation could be considered to exclude urine leak.   05/29/2018 Procedure   Placement of a CT injectable subcutaneous port device.   06/12/2018 - 07/03/2018 Chemotherapy   She received weekly cisplatin with radiation x 4 doses. She is not able to receive any more chemo due to severe pancytopenia   10/21/2018 PET scan   1. Status post hysterectomy. No findings identified to suggest residual or recurrent FDG avid tumor within the pelvis. No evidence for abdominopelvic nodal metastasis. 2. Persistence of focal hypermetabolic activity within subcentimeter right level 1B lymph node. No hypermetabolism is seen within the pharyngeal mucosal space. ENT evaluation should be considered, especially if the patient has risk factors for head and neck malignancy.   11/30/2018 Procedure   Successful right IJ vein Port-A-Cath explant.     Measurement of disease: TBD . Marland Kitchen  Radiology: No results found. .   Outpatient Encounter Medications as  of 07/27/2019  Medication Sig  . cholecalciferol (VITAMIN D3) 25 MCG (1000 UT) tablet Take 1,000 Units by mouth daily.  Marland Kitchen estradiol (ESTRACE VAGINAL) 0.1 MG/GM vaginal cream Place 1 Applicatorful vaginally 3  (three) times a week.  . Glucosamine-Chondroitin (MOVE FREE PO) Take 1 tablet by mouth daily.   Marland Kitchen ibuprofen (ADVIL,MOTRIN) 600 MG tablet Take 1 tablet (600 mg total) by mouth every 6 (six) hours as needed for mild pain.   No facility-administered encounter medications on file as of 07/27/2019.   No Known Allergies  Past Medical History:  Diagnosis Date  . Allergy   . Cervical cancer ([place]) 2019   hx chemo and radiation   . Frequent headaches    due to lack of sleep  . History of chemotherapy    last chemo 06/2018  . History of radiation therapy    last 07-25-2018  . Numbness    both hands and fingers  . PMB (postmenopausal bleeding)   . Seasonal allergies   . Squamous cell carcinoma in situ   . Tuberculosis    positive skin test  CXR clear   Past Surgical History:  Procedure Laterality Date  . CESAREAN SECTION     x 2  . ENDOMETRIAL BIOPSY  03/10/2018  . IR IMAGING GUIDED PORT INSERTION  05/29/2018  . IR REMOVAL TUN ACCESS W/ PORT W/O FL MOD SED  11/30/2018  . lymph node surgery     at 70 months old, mother told her but not sure what surgery exactly  . PELVIC LYMPH NODE DISSECTION N/A 04/28/2018   Procedure: PELVIC LYMPH NODE DISSECTION;  Surgeon: Everitt Amber, MD;  Location: [facility];  Service: Gynecology;  Laterality: N/A;  . ROBOTIC ASSISTED TOTAL HYSTERECTOMY N/A 04/28/2018   Procedure: XI ROBOTIC ASSISTED RADICAL  HYSTERECTOMY;  Surgeon: Everitt Amber, MD;  Location: [facility];  Service: Gynecology;  Laterality: N/A;  . SALPINGOOPHORECTOMY Bilateral 04/28/2018   Procedure: Marilynn Rail SALPINGO OOPHORECTOMY;  Surgeon: Everitt Amber, MD;  Location: [facility];  Service: Gynecology;  Laterality: Bilateral;        Past Gynecological History:   GYNECOLOGIC HISTORY:  . Patient's last menstrual period was 02/20/2018. age 39 . Menarche: 54 years old . P 2 . Contraceptive condoms . HRT None  . Last Pap  Referral pap SCCa Family Hx:  Family History  Problem Relation Age of Onset  . Healthy  Mother   . Hypertension Mother   . Healthy Father   . Cancer Neg Hx   . Heart disease Neg Hx   . Colon cancer Neg Hx   . Colon polyps Neg Hx    Social Hx:  Marland Kitchen Tobacco use: none . Alcohol use: none . Illicit Drug use: none . Illicit IV Drug use: none    Review of Systems: Review of Systems  Constitutional: Negative.   HENT:  Negative.   Eyes: Negative.   Respiratory: Negative.   Cardiovascular: Negative.   Gastrointestinal: Negative.   Endocrine: Negative.   Genitourinary: Negative for difficulty urinating and vaginal bleeding.   Musculoskeletal: Negative.   Skin: Negative.   Neurological: Negative.   Hematological: Negative.   Psychiatric/Behavioral: Negative.   All other systems reviewed and are negative.  Vitals:  Vitals:   07/27/19 1441  BP: 112/82  Pulse: 69  Resp: 17  Temp: 98.3 F (36.8 C)  SpO2: 99%   Vitals:   07/27/19 1441  Weight: 134 lb 12.8 oz (61.1 kg)  Height: 5\' 3"  (1.6 m)   Body  mass index is 23.88 kg/m.  Physical Exam: General :  Well developed, 54 y.o., female in no apparent distress HEENT:  Normocephalic/atraumatic, symmetric, EOMI, eyelids normal 1cm firm subcutaneous nodule on scalp - consistent with sebaceous cyst. No associated skin changes. Neck:   No visible masses.  Respiratory:  Respirations unlabored, no use of accessory muscles CV:   Deferred Breast:   Small pigmented benign appearing nevus on the left lateral mid periareola breast. No associated masses Musculoskeletal: Normal muscle strength. Abdomen:  No visible masses or protrusion. Well healed incisions Extremities:  No visible edema or deformities Skin:   Normal inspection Neuro/Psych:  No focal motor deficit, no abnormal mental status. Normal gait. Normal affect. Alert and oriented to person, place, and time  Genitourinary: Vagina significantly shortened (<10cm) with in tact cuff, suture material present, healing normally.    Assessment  Stage IIIC SCCa Cervix s/p  chemoradiation completed March, 2020. Complete clinical response.   Plan  I recommend continued surveillance with 3 monthly evaluations with Dr Sondra Come and myself. Pap at her next visit with me.   Everitt Amber, MD Gynecologic Oncologist 07/27/2019, 2:56 PM

## 2019-07-27 NOTE — Progress Notes (Signed)
Dr. Denman George,  Thank you for your care of this prior patient. I am no longer doing primary care for over 1 year. This patient may have a new PCP.  Thank you.   Colin Benton

## 2019-07-27 NOTE — Patient Instructions (Signed)
Dr. Denman George recommends following up with your doctor, Dr. Colin Benton, to discuss scheduling mammography, colonoscopy, and any other cancer screening for which you are overdue.  She recommends that you speak with Dr. Rodena Goldmann about the mass on your head to determine if it requires biopsy versus observation.  Dr. Denman George does not feel that this mass on your head represents cervical cancer recurrence.  Please notify Dr Denman George at phone number [phone] if you notice vaginal bleeding, new pelvic or abdominal pains, bloating, feeling full easy, or a change in bladder or bowel function.   Please have Dr Clabe Seal office contact Dr Serita Grit office (at [phone]) in June after your appointment with him to request an appointment with her for September, 2021.

## 2019-07-28 ENCOUNTER — Telehealth: Payer: Self-pay | Admitting: *Deleted

## 2019-07-28 NOTE — Telephone Encounter (Signed)
07/28/19-Spoke with the pt and she stated she has not seen anyone else for a primary doctor since the last visit here.  Informed pt Dr [place], Dr Ethlyn Gallery and Dr Jerilee Hoh are seeing new patients.  I offered the pt an appt with Dr Jerilee Hoh on a Tuesday morning for a virtual visit and the pt asked if we accept [employer] for international students (which is a Scientist, forensic).  I asked the front staff and it was suggested for the pt to contact her insurance to check.  Patient was advised to contact her insurance for coverage and stated she will call back for an appt.   Sherryle Lis   ===View-only below this line=== ----- Message ----- From: Lucretia Kern, DO Sent: 07/20/2019   6:17 PM EDT To: Agnes Lawrence, CMA  Can you check to ensure she has a PCP? IF so, please change designation in Epic and let Melissa know. If not please let Melissa know that I no longer do primary care and that pt needs PCP and let patient know as well. Thanks! ----- Message ----- From: Dorothyann Gibbs, NP Sent: 07/20/2019   3:36 PM EST To: Lucretia Kern, DO

## 2019-09-20 ENCOUNTER — Other Ambulatory Visit: Payer: Self-pay

## 2019-09-21 ENCOUNTER — Ambulatory Visit (INDEPENDENT_AMBULATORY_CARE_PROVIDER_SITE_OTHER): Payer: BLUE CROSS/BLUE SHIELD | Admitting: Family Medicine

## 2019-09-21 ENCOUNTER — Encounter: Payer: Self-pay | Admitting: Family Medicine

## 2019-09-21 VITALS — BP 112/72 | HR 67 | Temp 97.1°F | Resp 12 | Ht 63.0 in | Wt 130.2 lb

## 2019-09-21 DIAGNOSIS — L819 Disorder of pigmentation, unspecified: Secondary | ICD-10-CM | POA: Diagnosis not present

## 2019-09-21 DIAGNOSIS — L989 Disorder of the skin and subcutaneous tissue, unspecified: Secondary | ICD-10-CM | POA: Diagnosis not present

## 2019-09-21 DIAGNOSIS — D61818 Other pancytopenia: Secondary | ICD-10-CM

## 2019-09-21 NOTE — Progress Notes (Signed)
ACUTE VISIT Chief Complaint  Patient presents with  . Bump on back of head    noticed bump on back noticed several months ago   HPI: Holly Pena is a 54 y.o. female with hx of cervical cancer, headaches, and constipation who is here today with above concerns. Several months ago she noted a "bump" on the scalp. Problem is constant. It is not pruritic or tender. Sometimes sensitive when coming her hair. Slow growth. Negative for associated fever, chills, headache, visual changes, numbness, or neck pain. She has not tried OTC medications.  She is afraid of dying her hair, feels this could cause an "infection."  She is also concerned about pigmentation changes on right-sided upper chest.  She noted over 6 months ago. It is not tender or pruritic. Negative for history of insect bite or sick contact. Lesion has not changed.  She has not applied OTC medications. She has no other skin lesion elsewhere.  She follows with Dr. Alvy Bimler, oncologist. Diagnosed with cervical cancer in 02/2018, S, cell carcinoma. She underwent robotic-assisted type III radical laparoscopic hysterectomy with bilateral salpingectomy and bilateral pelvic lymphadenopathy. She received 6 pristine with radiation x4 doses, completed in 06/2018.  Lab Results  Component Value Date   WBC 3.6 (L) 01/29/2019   HGB 13.1 01/29/2019   HCT 38.5 01/29/2019   MCV 88.1 01/29/2019   PLT 161 01/29/2019   Review of Systems  Constitutional: Negative for unexpected weight change.  HENT: Negative for mouth sores and sore throat.   Respiratory: Negative for cough, shortness of breath and wheezing.   Cardiovascular: Negative for chest pain and palpitations.  Gastrointestinal: Negative for abdominal pain, nausea and vomiting.  Skin: Negative for wound.  Neurological: Negative for syncope, facial asymmetry and weakness.  Hematological: Negative for adenopathy. Does not bruise/bleed easily.  Psychiatric/Behavioral: Negative  for confusion. The patient is nervous/anxious.   Rest see pertinent positives and negatives per HPI.  Current Outpatient Medications on File Prior to Visit  Medication Sig Dispense Refill  . cholecalciferol (VITAMIN D3) 25 MCG (1000 UT) tablet Take 1,000 Units by mouth daily.    . Glucosamine-Chondroitin (MOVE FREE PO) Take 1 tablet by mouth daily.      No current facility-administered medications on file prior to visit.   Past Medical History:  Diagnosis Date  . Allergy   . Cervical cancer ([place]) 2019   hx chemo and radiation   . Frequent headaches    due to lack of sleep  . History of chemotherapy    last chemo 06/2018  . History of radiation therapy    last 07-25-2018  . Numbness    both hands and fingers  . PMB (postmenopausal bleeding)   . Seasonal allergies   . Squamous cell carcinoma in situ   . Tuberculosis    positive skin test  CXR clear   No Known Allergies  Social History   Socioeconomic History  . Marital status: Married    Spouse name: Not on file  . Number of children: 2  . Years of education: 79  . Highest education level: Not on file  Occupational History  . Occupation: student    Comment: [employer]  Tobacco Use  . Smoking status: Never Smoker  . Smokeless tobacco: Never Used  Substance and Sexual Activity  . Alcohol use: Yes    Comment: occ red wine   . Drug use: No  . Sexual activity: Yes    Partners: Male    Birth  control/protection: Condom, Post-menopausal    Comment: married  Other Topics Concern  . Not on file  Social History Narrative   [name] is From [place].  She lives with her husband & 2 daughters.    She attends college.    Drinks caffeine,.   Wears her seatbelt. Smoke detector in the home.    Exercises routinely.   Feels safe in her relationships.   Social Determinants of Health   Financial Resource Strain:   . Difficulty of Paying Living Expenses:   Food Insecurity:   . Worried About Charity fundraiser in the Last Year:   .  Arboriculturist in the Last Year:   Transportation Needs:   . Film/video editor (Medical):   Marland Kitchen Lack of Transportation (Non-Medical):   Physical Activity:   . Days of Exercise per Week:   . Minutes of Exercise per Session:   Stress:   . Feeling of Stress :   Social Connections:   . Frequency of Communication with Friends and Family:   . Frequency of Social Gatherings with Friends and Family:   . Attends Religious Services:   . Active Member of Clubs or Organizations:   . Attends Archivist Meetings:   Marland Kitchen Marital Status:    Vitals:   09/21/19 1216  BP: 112/72  Pulse: 67  Resp: 12  Temp: (!) 97.1 F (36.2 C)  SpO2: 98%   Body mass index is 23.07 kg/m.  Physical Exam  Nursing note and vitals reviewed. Constitutional: She is oriented to person, place, and time. She appears well-developed and well-nourished. No distress.  HENT:  Head: Normocephalic and atraumatic.  Mouth/Throat: Mucous membranes are normal.  Eyes: Conjunctivae are normal.  Cardiovascular: Normal rate and regular rhythm.  No murmur heard. Respiratory: Effort normal and breath sounds normal. No respiratory distress.  Musculoskeletal:        General: No edema.     Cervical back: No tenderness or bony tenderness. Normal range of motion.  Lymphadenopathy:    She has no cervical adenopathy.  Neurological: She is alert and oriented to person, place, and time. She has normal strength. Gait normal.  Skin: Skin is warm. No rash noted. No erythema.     Psychiatric: Her mood appears anxious.  Well groomed, good eye contact.    ASSESSMENT AND PLAN:  Holly Pena was seen today for bump on back of head.  Diagnoses and all orders for this visit:  Hypopigmented skin lesion We discussed possible etiologies. ?  Postinflammatory pigmentation changes. I do not think further work-up is needed at this time. I recommend monitoring for changes.  Lesion of subcutaneous tissue In the scalp. ?  Sebaceous  cyst. I think it can be monitored for now due to size but eventually needs to be removed.  Still I offered referral to surgeon, she agrees with waiting. Instructed about warning signs.  Pancytopenia, acquired [facility]) Following with oncologist. Return if symptoms worsen or fail to improve.   Hughie Melroy G. [place], MD  [facility]. [place] office.  Discharge Instructions   None    A few things to remember from today's visit:   Hypopigmented skin lesion  Lesion of subcutaneous tissue  Continue monitoring for changes. Lesions seem benign.  Epidermal Cyst  An epidermal cyst is a small, painless lump under your skin. The cyst contains a grayish-white, bad-smelling substance (keratin). Do not try to pop or open an epidermal cyst yourself. What are the causes?  A blocked  hair follicle.  A hair that curls and re-enters the skin instead of growing straight out of the skin.  A blocked pore.  Irritated skin.  An injury to the skin.  Certain conditions that are passed along from parent to child (inherited).  Human papillomavirus (HPV).  Long-term sun damage to the skin. What increases the risk?  Having acne.  Being overweight.  Being 86-41 years old. What are the signs or symptoms? These cysts are usually harmless, but they can get infected. Symptoms of infection may include:  Redness.  Inflammation.  Tenderness.  Warmth.  Fever.  A grayish-white, bad-smelling substance drains from the cyst.  Pus drains from the cyst. How is this treated? In many cases, epidermal cysts go away on their own without treatment. If a cyst becomes infected, treatment may include:  Opening and draining the cyst, done by a doctor. After draining, you may need minor surgery to remove the rest of the cyst.  Antibiotic medicine.  Shots of medicines (steroids) that help to reduce inflammation.  Surgery to remove the cyst. Surgery may be done if the cyst: ? Becomes  large. ? Bothers you. ? Has a chance of turning into cancer.  Do not try to open a cyst yourself. Follow these instructions at home:  Take over-the-counter and prescription medicines only as told by your doctor.  If you were prescribed an antibiotic medicine, take it it as told by your doctor. Do not stop using the antibiotic even if you start to feel better.  Keep the area around your cyst clean and dry.  Wear loose, dry clothing.  Avoid touching your cyst.  Check your cyst every day for signs of infection. Check for: ? Redness, swelling, or pain. ? Fluid or blood. ? Warmth. ? Pus or a bad smell.  Keep all follow-up visits as told by your doctor. This is important. How is this prevented?  Wear clean, dry, clothing.  Avoid wearing tight clothing.  Keep your skin clean and dry. Take showers or baths every day. Contact a doctor if:  Your cyst has symptoms of infection.  Your condition does not improve or gets worse.  You have a cyst that looks different from other cysts you have had.  You have a fever. Get help right away if:  Redness spreads from the cyst into the area close by.

## 2019-09-21 NOTE — Patient Instructions (Signed)
A few things to remember from today's visit:   Hypopigmented skin lesion  Lesion of subcutaneous tissue  Continue monitoring for changes. Lesions seem benign.  Epidermal Cyst  An epidermal cyst is a small, painless lump under your skin. The cyst contains a grayish-white, bad-smelling substance (keratin). Do not try to pop or open an epidermal cyst yourself. What are the causes?  A blocked hair follicle.  A hair that curls and re-enters the skin instead of growing straight out of the skin.  A blocked pore.  Irritated skin.  An injury to the skin.  Certain conditions that are passed along from parent to child (inherited).  Human papillomavirus (HPV).  Long-term sun damage to the skin. What increases the risk?  Having acne.  Being overweight.  Being 59-43 years old. What are the signs or symptoms? These cysts are usually harmless, but they can get infected. Symptoms of infection may include:  Redness.  Inflammation.  Tenderness.  Warmth.  Fever.  A grayish-white, bad-smelling substance drains from the cyst.  Pus drains from the cyst. How is this treated? In many cases, epidermal cysts go away on their own without treatment. If a cyst becomes infected, treatment may include:  Opening and draining the cyst, done by a doctor. After draining, you may need minor surgery to remove the rest of the cyst.  Antibiotic medicine.  Shots of medicines (steroids) that help to reduce inflammation.  Surgery to remove the cyst. Surgery may be done if the cyst: ? Becomes large. ? Bothers you. ? Has a chance of turning into cancer.  Do not try to open a cyst yourself. Follow these instructions at home:  Take over-the-counter and prescription medicines only as told by your doctor.  If you were prescribed an antibiotic medicine, take it it as told by your doctor. Do not stop using the antibiotic even if you start to feel better.  Keep the area around your cyst clean  and dry.  Wear loose, dry clothing.  Avoid touching your cyst.  Check your cyst every day for signs of infection. Check for: ? Redness, swelling, or pain. ? Fluid or blood. ? Warmth. ? Pus or a bad smell.  Keep all follow-up visits as told by your doctor. This is important. How is this prevented?  Wear clean, dry, clothing.  Avoid wearing tight clothing.  Keep your skin clean and dry. Take showers or baths every day. Contact a doctor if:  Your cyst has symptoms of infection.  Your condition does not improve or gets worse.  You have a cyst that looks different from other cysts you have had.  You have a fever. Get help right away if:  Redness spreads from the cyst into the area close by. Summary  An epidermal cyst is a sac made of skin tissue.  If a cyst becomes infected, treatment may include surgery to open and drain the cyst, or to remove it.  Take over-the-counter and prescription medicines only as told by your doctor.  Contact a doctor if your condition is not improving or is getting worse.  Keep all follow-up visits as told by your doctor. This is important. This information is not intended to replace advice given to you by your health care provider. Make sure you discuss any questions you have with your health care provider. Document Revised: 08/20/2018 Document Reviewed: 02/05/2018 Elsevier Patient Education  2020 [employer].

## 2019-09-26 ENCOUNTER — Encounter: Payer: Self-pay | Admitting: Gynecologic Oncology

## 2019-09-28 ENCOUNTER — Telehealth: Payer: Self-pay | Admitting: *Deleted

## 2019-09-28 NOTE — Telephone Encounter (Signed)
Called and scheduled the patient for a follow up appt  

## 2019-10-05 ENCOUNTER — Encounter: Payer: Self-pay | Admitting: Gynecologic Oncology

## 2019-10-05 ENCOUNTER — Inpatient Hospital Stay: Payer: BLUE CROSS/BLUE SHIELD | Attending: Gynecologic Oncology | Admitting: Gynecologic Oncology

## 2019-10-05 ENCOUNTER — Other Ambulatory Visit: Payer: Self-pay

## 2019-10-05 VITALS — BP 103/69 | HR 55 | Temp 98.4°F | Resp 17 | Ht 63.0 in | Wt 126.4 lb

## 2019-10-05 DIAGNOSIS — N899 Noninflammatory disorder of vagina, unspecified: Secondary | ICD-10-CM

## 2019-10-05 DIAGNOSIS — N898 Other specified noninflammatory disorders of vagina: Secondary | ICD-10-CM | POA: Insufficient documentation

## 2019-10-05 DIAGNOSIS — B3749 Other urogenital candidiasis: Secondary | ICD-10-CM

## 2019-10-05 DIAGNOSIS — C530 Malignant neoplasm of endocervix: Secondary | ICD-10-CM

## 2019-10-05 DIAGNOSIS — Z90710 Acquired absence of both cervix and uterus: Secondary | ICD-10-CM | POA: Diagnosis not present

## 2019-10-05 DIAGNOSIS — Z9079 Acquired absence of other genital organ(s): Secondary | ICD-10-CM

## 2019-10-05 DIAGNOSIS — Z90722 Acquired absence of ovaries, bilateral: Secondary | ICD-10-CM

## 2019-10-05 DIAGNOSIS — Z8249 Family history of ischemic heart disease and other diseases of the circulatory system: Secondary | ICD-10-CM | POA: Insufficient documentation

## 2019-10-05 DIAGNOSIS — Z923 Personal history of irradiation: Secondary | ICD-10-CM

## 2019-10-05 DIAGNOSIS — Z8541 Personal history of malignant neoplasm of cervix uteri: Secondary | ICD-10-CM | POA: Diagnosis not present

## 2019-10-05 DIAGNOSIS — Z9221 Personal history of antineoplastic chemotherapy: Secondary | ICD-10-CM | POA: Insufficient documentation

## 2019-10-05 MED ORDER — FLUCONAZOLE 100 MG PO TABS: 100.0000 mg | ORAL_TABLET | Freq: Every day | ORAL | 1 refills | Status: DC

## 2019-10-05 NOTE — Patient Instructions (Signed)
Please notify Dr Denman George at phone number [phone] if you notice vaginal bleeding, new pelvic or abdominal pains, bloating, feeling full easy, or a change in bladder or bowel function.   Please return to see Dr Sondra Come in June as scheduled and in September to see Dr Denman George.

## 2019-10-05 NOTE — Progress Notes (Signed)
[place] at [facility]   Progress Note: Established Patient Follow-Up Visit   Consult was originally requested by Dr. Mora Bellman for cervical cancer  Chief Complaint  Patient presents with  . Malignant neoplasm of endocervix [facility])    new vaginal discharge    GYN Oncologic Summary 1. Stage IIIC SCCa Cervix S/p radical hysterectomy, BSO, lymphadenectomy on 04/28/18  HPI: [name]  is a very nice 54 y.o.  P2  Interval History  She reports no residual toxicities of therapy.  She is using her vaginal dilator but denied engaging in intercourse because she is afraid.  She has notices a scant amount of yellow vaginal discharge 4 times since I last examined her in March, 2021. She has vaginal itch occasionally.   Oncologic Course She went through menopause ~2017. On February 20, 2018 she noted post-menopausal bleeding that lasted ~ 2 weeks (intermittent). She presented to her PCP who referred her on to Dr. Elly Modena. On exam she was noted to have a bulky and friable cervix. A Pap and EMB were performed.   A TVUS was ordered and done 03/17/18 revealing a normal uterus with 35mm EM lining. No adnexal masses.  Pap and EMB revealed squamous cell CA.  She is thus referred for management and recommendations.  Her last pelvic/Pap was at the time of her last pregnancy ~16 years ago  Dr Gerarda Fraction took her to the operating room for an EUA and cervical biopsies due to intolerance of examination in the office. Findings as noted:  Anterior cervical lip with gross lesion from ~10-12:00. Remainder of cervix grossly normal. Estimate lesion to be <2cm. No vaginal or parametrial involvement.   Grossly the remainder of the cervix appeared normal. It was firm on palpation but no fungating lesions. Pathology confirmed what the Pap/EMB showed 1. Cervix, biopsy, 12 o'clock - INVASIVE MODERATELY DIFFERENTIATED SQUAMOUS CELL CARCINOMA. SEE NOTE. 2. Cervix, biopsy, 3  o'clock - HIGH GRADE SQUAMOUS INTRAEPITHELIAL LESION (CIN-III, HIGH GRADE DYSPLASIA). SEE NOTE. 3. Cervix, biopsy, 9 o'clock - INVASIVE MODERATELY DIFFERENTIATED SQUAMOUS CELL CARCINOMA. SEE NOTE. 4. Cervix, biopsy, 6 o'clock - HIGH GRADE SQUAMOUS INTRAEPITHELIAL LESION (CIN-III, HIGH GRADE DYSPLASIA). SEE NOTE. Diagnosis Note 1. 2, 3 and 4. Dr. Melina Copa has reviewed this case and concurs with the above interpretation. (NDK:gt, 03/25/18) [name] MD   Dr Gerarda Fraction staged her clinically as a Stage IB1 (FIGO 2018) SCCa Cervix and ordered a PET/CT. Because Dr Gerarda Fraction stated that the tumor was <2cm, she counseled the patient that she was a candidate for either MIS or open approach. The patient elected for MIS approach and therefore elected to have surgery with Dr Denman George as Dr Gerarda Fraction does not perform radical hysterectomy via a MIS route.   PET/CT resulted with no apparent metastatic disease (though there was a mildly PET avid node in the neck felt to be unrelated). She had a 3.5cm area of increased avidity in the cervix.   The patient elected for robotic assisted hysterectomy, BSO, lymphadenectomy.   On April 28, 2018 she underwent robotic assisted type III radical laparoscopic hysterectomy with bilateral salpingectomy and bilateral pelvic lymphadenectomy.  Surgery was uncomplicated.  Operative findings is significant for 3.5 cm tumor placed in the anterior and right cervix with no gross parametrial involvement.  Pathology revealed a 4.5 x 2.6 x 1 cm invasive, poorly differentiated squamous cell carcinoma.  This resided in the cervix.  Margins were not involved.  There was lymphovascular involvement by the tumor.  2 of 6 paracervical lymph nodes were positive for metastatic carcinoma.  The right and left pelvic lymphadenectomy specimens were negative for metastatic disease.  She was determined to have high risk features for recurrence with deep cervical stromal involvement, large sized tumor  greater than 4 cm, lymphovascular space invasion, and metastatic involvement of lymph nodes.  In accordance with NCCN guidelines she was recommended adjuvant radiation with chemosensitization with cisplatin.  Postoperatively she was seen in the office for evaluation for a voiding trial.  She was initially unable to void urine spontaneously after Foley removal and required replacement and eventual intermittent self catheterization.   She went on to complete chemoradiation for advanced stage cervical cancer between January 30 and July 21, 2018.  She received a total of 45 Gray external beam radiation to the pelvis and 25 fractions, with a central pelvic boost of 3 fractions for a total of 5.4 Gray to the central pelvis.  She received weekly radiosensitizing cisplatin chemotherapy.  During therapy she developed neutropenia.  Post treatment restaging PET scan on October 21, 2018 which did not show any findings for residual or recurrent disease in the pelvis.  It did show persistence of a focal hypermetabolic activity and a subcentimeter neck node. ENT evaluation was negative for a pharyngeal lesion.   Imported EPIC Oncologic History:  Oncology History  Cervical cancer ([place])  02/20/2018 Initial Diagnosis   She presented with postmenopausal bleeding   03/10/2018 Pathology Results   Endometrium, biopsy - SQUAMOUS CELL CARCINOMA. - SEE COMMENT.   03/17/2018 Imaging   US pelvis: Endometrium measures 7 mm. In the setting of post-menopausal bleeding, endometrial sampling is indicated to exclude carcinoma   03/24/2018 Pathology Results   1. Cervix, biopsy, 12 o'clock - INVASIVE MODERATELY DIFFERENTIATED SQUAMOUS CELL CARCINOMA. SEE NOTE. 2. Cervix, biopsy, 3 o'clock - HIGH GRADE SQUAMOUS INTRAEPITHELIAL LESION (CIN-III, HIGH GRADE DYSPLASIA). SEE NOTE. 3. Cervix, biopsy, 9 o'clock - INVASIVE MODERATELY DIFFERENTIATED SQUAMOUS CELL CARCINOMA. SEE NOTE. 4. Cervix, biopsy, 6 o'clock - HIGH GRADE  SQUAMOUS INTRAEPITHELIAL LESION (CIN-III, HIGH GRADE DYSPLASIA). SEE NOTE.   03/24/2018 Procedure   Pre-operative Diagnosis:  Suspect SCCa Cervix based on endometrial biopsy  Post-operative Diagnosis:  Stage IB1 (FIGO 2018) SCCa Cervix  Operation:  Exam under anesthesia Cervical biopsies  Operative Findings:  Anterior cervical lip with gross lesion from ~10-12:00. Remainder of cervix grossly normal. Estimate lesion to be <2cm. No vaginal or parametrial involvement.   03/30/2018 Imaging   CT scan of chest, abdomen and pelvis: 1. Isolated low-density structure lateral to the descending colon. Differential considerations include isolated peritoneal implant/metastasis, GI duplication/mesenteric cyst, or postoperative collection such as seroma (clinical history only describes prior Caesarean section). Consider further evaluation with PET. 2. Otherwise, no evidence of metastatic disease in the chest, abdomen, or pelvis.   04/04/2018 PET scan   1. Prominent hypermetabolism within the cervix, consistent with known cervical cancer. No parametrial extension of tumor or abdominopelvic nodal metastatic disease. 2. The previously demonstrated low-density structure posterior to the descending colon demonstrates no hypermetabolic activity and is likely an incidental duplication/mesenteric cyst. 3. Focal hypermetabolic activity within a single right neck lymph node (level 1B). This is unlikely to be related to the patient's cervical cancer. No hypermetabolism is seen within the pharyngeal mucosal space. ENT evaluation should be considered, especially if the patient has risk factors for head and neck malignancy.   04/28/2018 Pathology Results   1. Lymph nodes, regional resection, right pelvic - EIGHT BENIGN LYMPH NODES (0/8). 2.  Lymph nodes, regional resection, left pelvic - SEVEN BENIGN LYMPH NODES (0/7). 3. Uterus, cervix and bilateral fallopian tubes, upper vagina CERVIX: - INVASIVE POORLY  DIFFERENTIATED SQUAMOUS CELL CARCINOMA, 4.5 X 2.6 X 1.0 CM. - MARGINS NOT INVOLVED. - LYMPHOVASCULAR INVOLVEMENT BY TUMOR. - METASTATIC CARCINOMA IN TWO OF SIX PARACERVICAL LYMPH NODES (2/6). BENIGN ENDOMETRIUM AND MYOMETRIUM SEE ONCOLOGY TABLE. 4. Vagina, biopsy, anterior vaginal margin - SQUAMOUS MUCOSA WITH SLIGHT INFLAMMATION. - NO EVIDENCE OF INVASIVE CARCINOMA. Microscopic Comment 3. UTERINE CERVIX: Resection  Procedure: Hysterectomy with right and left pelvic regional lymph nodes and anterior vaginal margin. Tumor Size: 4.5 x 2.6 x 1.0 cm. Histologic Type: Squamous cell carcinoma Histologic Grade: Poorly differentiated Stromal Invasion: Yes Depth of stromal invasion (millimeters): 10 mm Horizontal extent longitudinal/length (if applicable#) (millimeters): 25 mm Horizontal extent circumferential/width (if applicable#) (millimeters): 45 mm Other Tissue/ Organ: No Margins: Free of tumor Lymphovascular: Present Regional Lymph Nodes: Two positive for tumor cells (select all that apply) Total Number of Lymph Nodes Examined: Twenty-one Number of Sentinel Nodes Examined (if applicable): 0 Pathologic Stage Classification (pTNM, AJCC 8th Edition): pT1b2, pN1 Ancillary Studies: N/A Representative Tumor Block: 3C, 3D, 3E, 47F, 3J and 3K Comment(s): The tumor is a poorly differentiated invasive squamous cell carcinoma which is up to 1.0 cm (10 mm) in thickness. There is lymphovascular space involvement within the wall of the cervix and in the paracervical connective tissue. The margins of the specimen are not involved by carcinoma (JDP:kh 04/29/18)   04/28/2018 Surgery   Surgeon: Donaciano Eva  Pre-operative Diagnosis: stage IB2 cervical cancer  Post-operative Diagnosis: same  Operation: Robotic-assisted type III radical laparoscopic hysterectomy with bilateral salpingectomy and bilateral pelvic lymphadenectomy  Surgeon: Donaciano Eva  Operative Findings:  : 3.5cm  tumor replacing the anterior and right cervix. No gross parametrial involvement.         Specimens: left and right pelvic nodes, uterus with cervix, bilateral tubes and upper vagina. Anterior vaginal margin with marking stitch at 12 o'clock of new true distal anterior vaginal margin         Complications:  None; patient tolerated the procedure well.            05/11/2018 Cancer Staging   Staging form: Cervix Uteri, AJCC 8th Edition - Pathologic stage from 05/11/2018: Stage III (pT3, pN1, cM0) - Signed by Heath Lark, MD on 05/11/2018   05/12/2018 Imaging   1. Soft tissue inflammation about the bladder could reflect cystitis. Would correlate with the patient's symptoms. 2. Small to moderate amount of free fluid within the pelvis, of uncertain significance. This may simply be postoperative in nature, though if urine output from the Foley catheter decreases, further evaluation could be considered to exclude urine leak.   05/29/2018 Procedure   Placement of a CT injectable subcutaneous port device.   06/12/2018 - 07/03/2018 Chemotherapy   She received weekly cisplatin with radiation x 4 doses. She is not able to receive any more chemo due to severe pancytopenia   10/21/2018 PET scan   1. Status post hysterectomy. No findings identified to suggest residual or recurrent FDG avid tumor within the pelvis. No evidence for abdominopelvic nodal metastasis. 2. Persistence of focal hypermetabolic activity within subcentimeter right level 1B lymph node. No hypermetabolism is seen within the pharyngeal mucosal space. ENT evaluation should be considered, especially if the patient has risk factors for head and neck malignancy.   11/30/2018 Procedure   Successful right IJ vein Port-A-Cath explant.     Measurement  of disease: TBD . Marland Kitchen  Radiology: No results found. .   Outpatient Encounter Medications as of 10/05/2019  Medication Sig  . cholecalciferol (VITAMIN D3) 25 MCG (1000 UT) tablet Take 1,000  Units by mouth daily.  . Glucosamine-Chondroitin (MOVE FREE PO) Take 1 tablet by mouth daily.    No facility-administered encounter medications on file as of 10/05/2019.   No Known Allergies  Past Medical History:  Diagnosis Date  . Allergy   . Cervical cancer ([place]) 2019   hx chemo and radiation   . Frequent headaches    due to lack of sleep  . History of chemotherapy    last chemo 06/2018  . History of radiation therapy    last 07-25-2018  . Numbness    both hands and fingers  . PMB (postmenopausal bleeding)   . Seasonal allergies   . Squamous cell carcinoma in situ   . Tuberculosis    positive skin test  CXR clear   Past Surgical History:  Procedure Laterality Date  . CESAREAN SECTION     x 2  . ENDOMETRIAL BIOPSY  03/10/2018  . IR IMAGING GUIDED PORT INSERTION  05/29/2018  . IR REMOVAL TUN ACCESS W/ PORT W/O FL MOD SED  11/30/2018  . lymph node surgery     at 27 months old, mother told her but not sure what surgery exactly  . PELVIC LYMPH NODE DISSECTION N/A 04/28/2018   Procedure: PELVIC LYMPH NODE DISSECTION;  Surgeon: Everitt Amber, MD;  Location: [facility];  Service: Gynecology;  Laterality: N/A;  . ROBOTIC ASSISTED TOTAL HYSTERECTOMY N/A 04/28/2018   Procedure: XI ROBOTIC ASSISTED RADICAL  HYSTERECTOMY;  Surgeon: Everitt Amber, MD;  Location: [facility];  Service: Gynecology;  Laterality: N/A;  . SALPINGOOPHORECTOMY Bilateral 04/28/2018   Procedure: Marilynn Rail SALPINGO OOPHORECTOMY;  Surgeon: Everitt Amber, MD;  Location: [facility];  Service: Gynecology;  Laterality: Bilateral;        Past Gynecological History:   GYNECOLOGIC HISTORY:  . Patient's last menstrual period was 02/20/2018. age 41 . Menarche: 54 years old . P 2 . Contraceptive condoms . HRT None  . Last Pap  Referral pap SCCa Family Hx:  Family History  Problem Relation Age of Onset  . Healthy Mother   . Hypertension Mother   . Healthy Father   . Cancer Neg Hx   . Heart disease Neg Hx   . Colon cancer Neg Hx   .  Colon polyps Neg Hx    Social Hx:  Marland Kitchen Tobacco use: none . Alcohol use: none . Illicit Drug use: none . Illicit IV Drug use: none    Review of Systems: Review of Systems  Constitutional: Negative.   HENT:  Negative.   Eyes: Negative.   Respiratory: Negative.   Cardiovascular: Negative.   Gastrointestinal: Negative.   Endocrine: Negative.   Genitourinary: Negative for difficulty urinating and vaginal bleeding.   Musculoskeletal: Negative.   Skin: Negative.   Neurological: Negative.   Hematological: Negative.   Psychiatric/Behavioral: Negative.   All other systems reviewed and are negative.  Vitals:  Vitals:   10/05/19 1207  BP: 103/69  Pulse: (!) 55  Resp: 17  Temp: 98.4 F (36.9 C)  SpO2: 100%   Vitals:   10/05/19 1207  Weight: 126 lb 6.4 oz (57.3 kg)  Height: 5\' 3"  (1.6 m)   Body mass index is 22.39 kg/m.  Physical Exam: General :  Well developed, 54 y.o., female in no apparent distress HEENT:  Normocephalic/atraumatic, symmetric,  EOMI, eyelids normal Neck:   No visible masses.  Respiratory:  Respirations unlabored, no use of accessory muscles CV:   Deferred Breast:   Small pigmented benign appearing nevus on the left lateral mid periareola breast. No associated masses Musculoskeletal: Normal muscle strength. Abdomen:  No visible masses or protrusion. Well healed incisions Extremities:  No visible edema or deformities Skin:   Normal inspection Neuro/Psych:  No focal motor deficit, no abnormal mental status. Normal gait. Normal affect. Alert and oriented to person, place, and time  Genitourinary: Vagina significantly shortened (<8cm), smooth, atrophic, radiation changes, no masses or lesions or bleeding.    Assessment  Stage IIIC SCCa Cervix s/p chemoradiation completed March, 2020. Complete clinical response.  Normal vaginal discharge  Plan  I recommend continued surveillance with 3 monthly evaluations with Dr Sondra Come and myself. Pap at her next visit  with me.   Diflucan for itch  Everitt Amber, MD Gynecologic Oncologist 10/05/2019, 12:47 PM

## 2019-10-18 ENCOUNTER — Encounter: Payer: Self-pay | Admitting: Gastroenterology

## 2019-10-19 ENCOUNTER — Other Ambulatory Visit: Payer: Self-pay | Admitting: Family Medicine

## 2019-10-19 DIAGNOSIS — Z1231 Encounter for screening mammogram for malignant neoplasm of breast: Secondary | ICD-10-CM

## 2019-10-20 ENCOUNTER — Ambulatory Visit
Admission: RE | Admit: 2019-10-20 | Discharge: 2019-10-20 | Disposition: A | Discharge status: Home / Self Care | Payer: BLUE CROSS/BLUE SHIELD | Source: Ambulatory Visit

## 2019-10-20 ENCOUNTER — Other Ambulatory Visit: Payer: Self-pay

## 2019-10-20 DIAGNOSIS — Z1231 Encounter for screening mammogram for malignant neoplasm of breast: Secondary | ICD-10-CM

## 2019-10-27 NOTE — Progress Notes (Signed)
Radiation Oncology         [phone] ________________________________  Name: Holly Pena MRN: [id number]  Date: 10/28/2019  DOB: 16-Mar-1966  Follow-Up Visit Note  CC: Martinique, Betty G, MD  Everitt Amber, MD    ICD-10-CM   1. Malignant neoplasm of endocervix [facility])  C53.0 DG Chest 2 View    Diagnosis: Stage III-C(pT1b2, pN1)poorly differentiated squamous cell carcinoma of the cervix  Interval Since Last Radiation: One year, three months, and one week  Radiation treatment dates:  06/11/2018 - 07/21/2018  Site/dose:   1. pelvis / 25 fractions x 1.8 Gy for a total of 45 Gy                      2. Central pelvis Boost / 3 fractions x 1.8 Gy for a total of 5.4 Gy  Narrative: The patient returns today for routine follow-up. She is doing well overall. Since her last visit, she was seen by Dr. Denman George on 07/27/2019. At that time, she was noted to have a complete clinical response and was recommended to continue surveillance every three months. She was seen by Dr. Denman George again on 10/05/2019 with complaint of intermittent, scant yellow vaginal discharge with associated occasional pruritis. Her GU examination was within normal limits for her condition. She was prescribed Diflucan for the itching.  Of note, she underwent a bilateral screening mammogram on 10/22/2019 that did not show any mammographic evidence of malignancy.  On review of systems, she reports  pain at the right chest bone. She denies vaginal bleeding, dysuria, fatigue, nausea, vomiting, and diarrhea.  She is concerned about this pain along the right anterior chest region.  She continues to use her vaginal dilator.  She has not resumed sexual intercourse.                      ALLERGIES:  has No Known Allergies.  Meds: Current Outpatient Medications  Medication Sig Dispense Refill   b complex vitamins capsule Take 1 capsule by mouth daily.     cholecalciferol (VITAMIN D3) 25 MCG (1000 UT) tablet Take 1,000 Units by mouth daily.      Glucosamine-Chondroitin (MOVE FREE PO) Take 1 tablet by mouth daily.      fluconazole (DIFLUCAN) 100 MG tablet Take 1 tablet (100 mg total) by mouth daily. (Patient not taking: Reported on 10/28/2019) 1 tablet 1   No current facility-administered medications for this encounter.    Physical Findings: The patient is in no acute distress. Patient is alert and oriented.  height is 5\' 3"  (1.6 m) and weight is 127 lb 12.8 oz (58 kg). Her temperature is 97.2 F (36.2 C) (abnormal). Her blood pressure is 104/79 and her pulse is 61. Her respiration is 18 and oxygen saturation is 100%.   Lungs are clear to auscultation bilaterally. Heart has regular rate and rhythm. No palpable cervical, supraclavicular, or axillary adenopathy. Abdomen soft, non-tender, normal bowel sounds.  On pelvic examination the external genitalia were unremarkable. A speculum exam was performed. There are no mucosal lesions noted in the vaginal vault. On bimanual there were no pelvic masses appreciated.  Some radiation changes noted in the proximal vagina.  The vaginal vault is narrowed and shortened but does permit index finger examination  a detailed breast exam was performed.  No masses noted on examination of either breast.  No nipple discharge or bleeding.  Patient has some tenderness with palpation along the medial right anterior rib cage  area.  No visible mass along this rib region.  Lab Findings: Lab Results  Component Value Date   WBC 3.6 (L) 01/29/2019   HGB 13.1 01/29/2019   HCT 38.5 01/29/2019   MCV 88.1 01/29/2019   PLT 161 01/29/2019    Radiographic Findings: MM 3D SCREEN BREAST BILATERAL  Result Date: 10/22/2019 CLINICAL DATA:  Screening. EXAM: DIGITAL SCREENING BILATERAL MAMMOGRAM WITH TOMO AND CAD COMPARISON:  Previous exam(s). ACR Breast Density Category b: There are scattered areas of fibroglandular density. FINDINGS: There are no findings suspicious for malignancy. Images were processed with CAD.  IMPRESSION: No mammographic evidence of malignancy. A result letter of this screening mammogram will be mailed directly to the patient. RECOMMENDATION: Screening mammogram in one year. (Code:[id number]) BI-RADS CATEGORY  1: Negative. Electronically Signed   By: Fidela Salisbury M.D.   On: 10/22/2019 15:59    Impression:  Stage III-C(pT1b2, pN1)poorly differentiated squamous cell carcinoma of the cervix.  No evidence of recurrence on clinical exam today.  The etiology of the patient's rib pain is unknown.  We discussed initial chest x-ray for further evaluation and patient does wish to proceed with this exam today.  Plan: Routine follow-up with radiation oncology in six months. The patient will follow up with Dr. Denman George in three months, during which time she will have a PAP smear.   Total time spent in this encounter was 25 minutes which included reviewing the patient's most recent mammogram, interval history, physical examination, and documentation and ordering of imaging studies.  ____________________________________  Blair Promise, PhD, MD  This document serves as a record of services personally performed by Gery Pray, MD. It was created on his behalf by Clerance Lav, a trained medical scribe. The creation of this record is based on the scribe's personal observations and the provider's statements to them. This document has been checked and approved by the attending provider.

## 2019-10-28 ENCOUNTER — Ambulatory Visit (HOSPITAL_COMMUNITY)
Admission: RE | Admit: 2019-10-28 | Discharge: 2019-10-28 | Disposition: A | Discharge status: Home / Self Care | Payer: BLUE CROSS/BLUE SHIELD | Source: Ambulatory Visit | Attending: Radiation Oncology | Admitting: Radiation Oncology

## 2019-10-28 ENCOUNTER — Ambulatory Visit
Admission: RE | Admit: 2019-10-28 | Discharge: 2019-10-28 | Disposition: A | Discharge status: Home / Self Care | Payer: BLUE CROSS/BLUE SHIELD | Source: Ambulatory Visit | Attending: Radiation Oncology | Admitting: Radiation Oncology

## 2019-10-28 ENCOUNTER — Encounter: Payer: Self-pay | Admitting: Radiation Oncology

## 2019-10-28 ENCOUNTER — Other Ambulatory Visit: Payer: Self-pay

## 2019-10-28 VITALS — BP 104/79 | HR 61 | Temp 97.2°F | Resp 18 | Ht 63.0 in | Wt 127.8 lb

## 2019-10-28 DIAGNOSIS — Z8541 Personal history of malignant neoplasm of cervix uteri: Secondary | ICD-10-CM | POA: Diagnosis present

## 2019-10-28 DIAGNOSIS — R0781 Pleurodynia: Secondary | ICD-10-CM | POA: Diagnosis not present

## 2019-10-28 DIAGNOSIS — C530 Malignant neoplasm of endocervix: Secondary | ICD-10-CM

## 2019-10-28 DIAGNOSIS — Z923 Personal history of irradiation: Secondary | ICD-10-CM | POA: Insufficient documentation

## 2019-10-28 NOTE — Progress Notes (Signed)
Patient here for a 6 month follow up visit with Dr. Sondra Come. Patient denies pain except for occasional pain at her right chest bone. Denies bleeding or dysuria. Patient denies fatigue or GI problems.   BP 104/79 (BP Location: Left Arm, Patient Position: Sitting, Cuff Size: Normal)   Pulse 61   Temp (!) 97.2 F (36.2 C)   Resp 18   Ht 5\' 3"  (1.6 m)   Wt 127 lb 12.8 oz (58 kg)   LMP 02/20/2018 Comment: no period in 2 years  SpO2 100%   BMI 22.64 kg/m    Wt Readings from Last 3 Encounters:  10/28/19 127 lb 12.8 oz (58 kg)  10/05/19 126 lb 6.4 oz (57.3 kg)  09/21/19 130 lb 4 oz (59.1 kg)

## 2019-11-16 ENCOUNTER — Telehealth: Payer: Self-pay

## 2019-11-16 NOTE — Telephone Encounter (Signed)
Patient called to advise of her xray results of her ribs being wnl. VM is full.

## 2019-11-17 ENCOUNTER — Telehealth: Payer: Self-pay

## 2019-11-17 ENCOUNTER — Other Ambulatory Visit: Payer: Self-pay

## 2019-11-17 ENCOUNTER — Ambulatory Visit (AMBULATORY_SURGERY_CENTER): Payer: Self-pay | Admitting: *Deleted

## 2019-11-17 VITALS — Ht 63.0 in | Wt 126.0 lb

## 2019-11-17 DIAGNOSIS — Z1211 Encounter for screening for malignant neoplasm of colon: Secondary | ICD-10-CM

## 2019-11-17 DIAGNOSIS — Z01818 Encounter for other preprocedural examination: Secondary | ICD-10-CM

## 2019-11-17 MED ORDER — SUTAB 1479-225-188 MG PO TABS: 1.0000 | ORAL_TABLET | Freq: Once | ORAL | 0 refills | Status: AC

## 2019-11-17 NOTE — Progress Notes (Signed)
Interpreter used today at the [facility] for this pt.  Interpreter's name is- [email or web address]  Pt denies constipation issues  covid test 11-30-19 at 3:30 pm  Pt is aware that care partner will wait in the car during procedure; if they feel like they will be too hot or cold to wait in the car; they may wait in the 4 th floor lobby. Patient is aware to bring only one care partner. We want them to wear a mask (we do not have any that we can provide them), practice social distancing, and we will check their temperatures when they get here.  I did remind the patient that their care partner needs to stay in the parking lot the entire time and have a cell phone available, we will call them when the pt is ready for discharge. Patient will wear mask into building.   No egg or soy allergy  No home oxygen use   No medications for weight loss taken  Pt denies constipation issues   No trouble with anesthesia, difficulty with intubation or hx/fam hx of malignant hyperthermia per pt   Sutab code put into RX and paper copy given to pt to show pharmacy

## 2019-11-17 NOTE — Telephone Encounter (Signed)
TC to patient and VM ifs full.

## 2019-11-18 ENCOUNTER — Telehealth: Payer: Self-pay

## 2019-11-18 NOTE — Telephone Encounter (Signed)
TC to patient and VM full. Will attempt to f/u again next week.

## 2019-11-23 ENCOUNTER — Encounter: Payer: Self-pay | Admitting: Gastroenterology

## 2019-11-30 ENCOUNTER — Ambulatory Visit (INDEPENDENT_AMBULATORY_CARE_PROVIDER_SITE_OTHER): Payer: PRIVATE HEALTH INSURANCE

## 2019-11-30 ENCOUNTER — Other Ambulatory Visit: Payer: Self-pay | Admitting: Gastroenterology

## 2019-11-30 DIAGNOSIS — Z1159 Encounter for screening for other viral diseases: Secondary | ICD-10-CM

## 2019-12-01 LAB — SARS CORONAVIRUS 2 (TAT 6-24 HRS): SARS Coronavirus 2: NEGATIVE

## 2019-12-03 ENCOUNTER — Ambulatory Visit (AMBULATORY_SURGERY_CENTER): Payer: BLUE CROSS/BLUE SHIELD | Admitting: Gastroenterology

## 2019-12-03 ENCOUNTER — Other Ambulatory Visit: Payer: Self-pay

## 2019-12-03 ENCOUNTER — Encounter: Payer: Self-pay | Admitting: Gastroenterology

## 2019-12-03 VITALS — BP 96/57 | HR 59 | Temp 96.9°F | Resp 11 | Ht 63.0 in | Wt 126.0 lb

## 2019-12-03 DIAGNOSIS — D125 Benign neoplasm of sigmoid colon: Secondary | ICD-10-CM

## 2019-12-03 DIAGNOSIS — Z1211 Encounter for screening for malignant neoplasm of colon: Secondary | ICD-10-CM

## 2019-12-03 DIAGNOSIS — D120 Benign neoplasm of cecum: Secondary | ICD-10-CM

## 2019-12-03 MED ORDER — SODIUM CHLORIDE 0.9 % IV SOLN: 500.0000 mL | Freq: Once | INTRAVENOUS | Status: DC

## 2019-12-03 NOTE — Progress Notes (Signed)
Pt's states no medical or surgical changes since previsit or office visit.  Interpreter: Rosann Auerbach   [place]

## 2019-12-03 NOTE — Patient Instructions (Signed)
Handouts Provided:  Polyps  YOU HAD AN ENDOSCOPIC PROCEDURE TODAY AT THE [facility]:   Refer to the procedure report that was given to you for any specific questions about what was found during the examination.  If the procedure report does not answer your questions, please call your gastroenterologist to clarify.  If you requested that your care partner not be given the details of your procedure findings, then the procedure report has been included in a sealed envelope for you to review at your convenience later.  YOU SHOULD EXPECT: Some feelings of bloating in the abdomen. Passage of more gas than usual.  Walking can help get rid of the air that was put into your GI tract during the procedure and reduce the bloating. If you had a lower endoscopy (such as a colonoscopy or flexible sigmoidoscopy) you may notice spotting of blood in your stool or on the toilet paper. If you underwent a bowel prep for your procedure, you may not have a normal bowel movement for a few days.  Please Note:  You might notice some irritation and congestion in your nose or some drainage.  This is from the oxygen used during your procedure.  There is no need for concern and it should clear up in a day or so.  SYMPTOMS TO REPORT IMMEDIATELY:   Following lower endoscopy (colonoscopy or flexible sigmoidoscopy):  Excessive amounts of blood in the stool  Significant tenderness or worsening of abdominal pains  Swelling of the abdomen that is new, acute  Fever of 100F or higher  For urgent or emergent issues, a gastroenterologist can be reached at any hour by calling [phone]. Do not use MyChart messaging for urgent concerns.    DIET:  We do recommend a small meal at first, but then you may proceed to your regular diet.  Drink plenty of fluids but you should avoid alcoholic beverages for 24 hours.  ACTIVITY:  You should plan to take it easy for the rest of today and you should NOT DRIVE or use heavy  machinery until tomorrow (because of the sedation medicines used during the test).    FOLLOW UP: Our staff will call the number listed on your records 48-72 hours following your procedure to check on you and address any questions or concerns that you may have regarding the information given to you following your procedure. If we do not reach you, we will leave a message.  We will attempt to reach you two times.  During this call, we will ask if you have developed any symptoms of COVID 19. If you develop any symptoms (ie: fever, flu-like symptoms, shortness of breath, cough etc.) before then, please call [phone].  If you test positive for Covid 19 in the 2 weeks post procedure, please call and report this information to us.    If any biopsies were taken you will be contacted by phone or by letter within the next 1-3 weeks.  Please call us at [phone] if you have not heard about the biopsies in 3 weeks.    SIGNATURES/CONFIDENTIALITY: You and/or your care partner have signed paperwork which will be entered into your electronic medical record.  These signatures attest to the fact that that the information above on your After Visit Summary has been reviewed and is understood.  Full responsibility of the confidentiality of this discharge information lies with you and/or your care-partner.  

## 2019-12-03 NOTE — Op Note (Signed)
[facility] Patient Name: Holly Pena Procedure Date: 12/03/2019 1:26 PM MRN: [id number] Endoscopist: Mauri Pole , MD Age: 54 Referring MD:  Date of Birth: [birth year] Gender: Female Account #: [id number] Procedure:                Colonoscopy Indications:              Screening for colorectal malignant neoplasm Medicines:                Monitored Anesthesia Care Procedure:                Pre-Anesthesia Assessment:                           - Prior to the procedure, a History and Physical                            was performed, and patient medications and                            allergies were reviewed. The patient's tolerance of                            previous anesthesia was also reviewed. The risks                            and benefits of the procedure and the sedation                            options and risks were discussed with the patient.                            All questions were answered, and informed consent                            was obtained. Prior Anticoagulants: The patient has                            taken no previous anticoagulant or antiplatelet                            agents. ASA Grade Assessment: II - A patient with                            mild systemic disease. After reviewing the risks                            and benefits, the patient was deemed in                            satisfactory condition to undergo the procedure.                           After obtaining informed consent, the colonoscope  was passed under direct vision. Throughout the                            procedure, the patient's blood pressure, pulse, and                            oxygen saturations were monitored continuously. The                            Colonoscope was introduced through the anus and                            advanced to the the cecum, identified by                            appendiceal orifice and  ileocecal valve. The                            colonoscopy was performed without difficulty. The                            patient tolerated the procedure well. The quality                            of the bowel preparation was excellent. The                            ileocecal valve, appendiceal orifice, and rectum                            were photographed. Scope In: 1:37:05 PM Scope Out: 1:53:10 PM Scope Withdrawal Time: 0 hours 11 minutes 5 seconds  Total Procedure Duration: 0 hours 16 minutes 5 seconds  Findings:                 The perianal and digital rectal examinations were                            normal.                           Two sessile polyps were found in the sigmoid colon                            and cecum. The polyps were 5 to 7 mm in size. These                            polyps were removed with a cold snare. Resection                            and retrieval were complete.                           A patchy area of mildly altered vascular and  erythematous mucosa was found in the rectum.                           Non-bleeding internal hemorrhoids were found during                            retroflexion. The hemorrhoids were small. Complications:            No immediate complications. Estimated Blood Loss:     Estimated blood loss was minimal. Impression:               - Two 5 to 7 mm polyps in the sigmoid colon and in                            the cecum, removed with a cold snare. Resected and                            retrieved.                           - Altered vascular and erythematous mucosa in the                            rectum consistent with radiation proctitis.                           - Non-bleeding internal hemorrhoids. Recommendation:           - Patient has a contact number available for                            emergencies. The signs and symptoms of potential                            delayed  complications were discussed with the                            patient. Return to normal activities tomorrow.                            Written discharge instructions were provided to the                            patient.                           - Resume previous diet.                           - Continue present medications.                           - Await pathology results.                           - Repeat colonoscopy in 5-10 years for surveillance  based on pathology results. Mauri Pole, MD 12/03/2019 2:01:54 PM This report has been signed electronically.

## 2019-12-03 NOTE — Progress Notes (Signed)
Called to room to assist during endoscopic procedure.  Patient ID and intended procedure confirmed with present staff. Received instructions for my participation in the procedure from the performing physician.  

## 2019-12-03 NOTE — Progress Notes (Signed)
Pt Drowsy. VSS. To PACU, report to RN. No anesthetic complications noted.  

## 2019-12-07 ENCOUNTER — Telehealth: Payer: Self-pay

## 2019-12-07 NOTE — Telephone Encounter (Signed)
°  Follow up Call-  Call back number 12/03/2019  Post procedure Call Back phone  # [phone]  Permission to leave phone message Yes  Some recent data might be hidden     Patient questions:  Do you have a fever, pain , or abdominal swelling? No. Pain Score  0 *  Have you tolerated food without any problems? Yes.    Have you been able to return to your normal activities? Yes.    Do you have any questions about your discharge instructions: Diet   No. Medications  No. Follow up visit  No.  Do you have questions or concerns about your Care? No.  Actions: * If pain score is 4 or above: No action needed, pain <4. 1. Have you developed a fever since your procedure? no  2.   Have you had an respiratory symptoms (SOB or cough) since your procedure? no  3.   Have you tested positive for COVID 19 since your procedure no  4.   Have you had any family members/close contacts diagnosed with the COVID 19 since your procedure?  no   If yes to any of these questions please route to Joylene John, RN and Erenest Rasher, RN

## 2019-12-08 ENCOUNTER — Telehealth: Payer: Self-pay

## 2019-12-08 NOTE — Telephone Encounter (Signed)
RN called patient to advise her that her chest xray from June was within normal limits. Patient verbalized understanding and voiced appreciation for the call.

## 2019-12-21 ENCOUNTER — Encounter: Payer: Self-pay | Admitting: Gastroenterology

## 2020-01-31 ENCOUNTER — Other Ambulatory Visit (HOSPITAL_COMMUNITY)
Admission: RE | Admit: 2020-01-31 | Discharge: 2020-01-31 | Disposition: A | Discharge status: Home / Self Care | Payer: PRIVATE HEALTH INSURANCE | Source: Ambulatory Visit | Attending: Gynecologic Oncology | Admitting: Gynecologic Oncology

## 2020-01-31 ENCOUNTER — Inpatient Hospital Stay: Payer: BLUE CROSS/BLUE SHIELD | Attending: Gynecologic Oncology | Admitting: Gynecologic Oncology

## 2020-01-31 ENCOUNTER — Other Ambulatory Visit: Payer: Self-pay

## 2020-01-31 ENCOUNTER — Encounter: Payer: Self-pay | Admitting: Gynecologic Oncology

## 2020-01-31 VITALS — BP 119/66 | HR 71 | Temp 99.2°F | Resp 17 | Ht 63.0 in | Wt 126.4 lb

## 2020-01-31 DIAGNOSIS — Z923 Personal history of irradiation: Secondary | ICD-10-CM | POA: Insufficient documentation

## 2020-01-31 DIAGNOSIS — Z8541 Personal history of malignant neoplasm of cervix uteri: Secondary | ICD-10-CM | POA: Insufficient documentation

## 2020-01-31 DIAGNOSIS — C530 Malignant neoplasm of endocervix: Secondary | ICD-10-CM

## 2020-01-31 DIAGNOSIS — Z9221 Personal history of antineoplastic chemotherapy: Secondary | ICD-10-CM | POA: Diagnosis not present

## 2020-01-31 DIAGNOSIS — Z90710 Acquired absence of both cervix and uterus: Secondary | ICD-10-CM | POA: Diagnosis not present

## 2020-01-31 DIAGNOSIS — Z90722 Acquired absence of ovaries, bilateral: Secondary | ICD-10-CM | POA: Diagnosis not present

## 2020-01-31 DIAGNOSIS — Z8249 Family history of ischemic heart disease and other diseases of the circulatory system: Secondary | ICD-10-CM | POA: Diagnosis not present

## 2020-01-31 DIAGNOSIS — Z9079 Acquired absence of other genital organ(s): Secondary | ICD-10-CM | POA: Insufficient documentation

## 2020-01-31 NOTE — Patient Instructions (Signed)
If your abdominal pains increase in frequency or intensity, notify, Dr Denman George and she will order a CT scan.   Please notify Dr Denman George at phone number [phone] if you notice vaginal bleeding, new pelvic or abdominal pains, bloating, feeling full easy, or a change in bladder or bowel function.   Please see Dr Sondra Come in December. Please have Dr Clabe Seal office contact Dr Serita Grit office (at [phone]) in December after your appointment with him to request an appointment with Dr Denman George for March, 2022.

## 2020-01-31 NOTE — Progress Notes (Signed)
[place] [facility]   Progress Note: Established Patient Follow-Up Visit   Consult was originally requested by Dr. Mora Bellman for cervical cancer  Chief Complaint  Patient presents with  . Cervical Cancer    follow up    GYN Oncologic Summary 1. Stage IIIC SCCa Cervix S/p radical hysterectomy, BSO, lymphadenectomy on 04/28/18  HPI: [name]  is a very nice 54 y.o.  P2  Interval History  She reports no residual toxicities of therapy. She has some mild left lower quadrant abdominal pains, intermittent.   Oncologic Course She went through menopause ~2017. On February 20, 2018 she noted post-menopausal bleeding that lasted ~ 2 weeks (intermittent). She presented to her PCP who referred her on to Dr. Elly Modena. On exam she was noted to have a bulky and friable cervix. A Pap and EMB were performed.   A TVUS was ordered and done 03/17/18 revealing a normal uterus with 44mm EM lining. No adnexal masses.  Pap and EMB revealed squamous cell CA.  She is thus referred for management and recommendations.  Her last pelvic/Pap was at the time of her last pregnancy ~16 years ago  Dr Gerarda Fraction took her to the operating room for an EUA and cervical biopsies due to intolerance of examination in the office. Findings as noted:  Anterior cervical lip with gross lesion from ~10-12:00. Remainder of cervix grossly normal. Estimate lesion to be <2cm. No vaginal or parametrial involvement.   Grossly the remainder of the cervix appeared normal. It was firm on palpation but no fungating lesions. Pathology confirmed what the Pap/EMB showed 1. Cervix, biopsy, 12 o'clock - INVASIVE MODERATELY DIFFERENTIATED SQUAMOUS CELL CARCINOMA. SEE NOTE. 2. Cervix, biopsy, 3 o'clock - HIGH GRADE SQUAMOUS INTRAEPITHELIAL LESION (CIN-III, HIGH GRADE DYSPLASIA). SEE NOTE. 3. Cervix, biopsy, 9 o'clock - INVASIVE MODERATELY DIFFERENTIATED SQUAMOUS CELL CARCINOMA. SEE NOTE. 4.  Cervix, biopsy, 6 o'clock - HIGH GRADE SQUAMOUS INTRAEPITHELIAL LESION (CIN-III, HIGH GRADE DYSPLASIA). SEE NOTE. Diagnosis Note 1. 2, 3 and 4. Dr. Melina Copa has reviewed this case and concurs with the above interpretation. (NDK:gt, 03/25/18) [name] MD   Dr Gerarda Fraction staged her clinically as a Stage IB1 (FIGO 2018) SCCa Cervix and ordered a PET/CT. Because Dr Gerarda Fraction stated that the tumor was <2cm, she counseled the patient that she was a candidate for either MIS or open approach. The patient elected for MIS approach and therefore elected to have surgery with Dr Denman George as Dr Gerarda Fraction does not perform radical hysterectomy via a MIS route.   PET/CT resulted with no apparent metastatic disease (though there was a mildly PET avid node in the neck felt to be unrelated). She had a 3.5cm area of increased avidity in the cervix.   The patient elected for robotic assisted hysterectomy, BSO, lymphadenectomy.   On April 28, 2018 she underwent robotic assisted type III radical laparoscopic hysterectomy with bilateral salpingectomy and bilateral pelvic lymphadenectomy.  Surgery was uncomplicated.  Operative findings is significant for 3.5 cm tumor placed in the anterior and right cervix with no gross parametrial involvement.  Pathology revealed a 4.5 x 2.6 x 1 cm invasive, poorly differentiated squamous cell carcinoma.  This resided in the cervix.  Margins were not involved.  There was lymphovascular involvement by the tumor.  2 of 6 paracervical lymph nodes were positive for metastatic carcinoma.  The right and left pelvic lymphadenectomy specimens were negative for metastatic disease.  She was determined to have high risk features for recurrence  with deep cervical stromal involvement, large sized tumor greater than 4 cm, lymphovascular space invasion, and metastatic involvement of lymph nodes.  In accordance with NCCN guidelines she was recommended adjuvant radiation with chemosensitization with  cisplatin.  Postoperatively she was seen in the office for evaluation for a voiding trial.  She was initially unable to void urine spontaneously after Foley removal and required replacement and eventual intermittent self catheterization.   She went on to complete chemoradiation for advanced stage cervical cancer between January 30 and July 21, 2018.  She received a total of 45 Gray external beam radiation to the pelvis and 25 fractions, with a central pelvic boost of 3 fractions for a total of 5.4 Gray to the central pelvis.  She received weekly radiosensitizing cisplatin chemotherapy.  During therapy she developed neutropenia.  Post treatment restaging PET scan on October 21, 2018 which did not show any findings for residual or recurrent disease in the pelvis.  It did show persistence of a focal hypermetabolic activity and a subcentimeter neck node. ENT evaluation was negative for a pharyngeal lesion.  Pap in September, 2020 was negative and negative for high risk HPV.   Imported EPIC Oncologic History:  Oncology History  Cervical cancer ([place])  02/20/2018 Initial Diagnosis   She presented with postmenopausal bleeding   03/10/2018 Pathology Results   Endometrium, biopsy - SQUAMOUS CELL CARCINOMA. - SEE COMMENT.   03/17/2018 Imaging   US pelvis: Endometrium measures 7 mm. In the setting of post-menopausal bleeding, endometrial sampling is indicated to exclude carcinoma   03/24/2018 Pathology Results   1. Cervix, biopsy, 12 o'clock - INVASIVE MODERATELY DIFFERENTIATED SQUAMOUS CELL CARCINOMA. SEE NOTE. 2. Cervix, biopsy, 3 o'clock - HIGH GRADE SQUAMOUS INTRAEPITHELIAL LESION (CIN-III, HIGH GRADE DYSPLASIA). SEE NOTE. 3. Cervix, biopsy, 9 o'clock - INVASIVE MODERATELY DIFFERENTIATED SQUAMOUS CELL CARCINOMA. SEE NOTE. 4. Cervix, biopsy, 6 o'clock - HIGH GRADE SQUAMOUS INTRAEPITHELIAL LESION (CIN-III, HIGH GRADE DYSPLASIA). SEE NOTE.   03/24/2018 Procedure   Pre-operative Diagnosis:   Suspect SCCa Cervix based on endometrial biopsy  Post-operative Diagnosis:  Stage IB1 (FIGO 2018) SCCa Cervix  Operation:  Exam under anesthesia Cervical biopsies  Operative Findings:  Anterior cervical lip with gross lesion from ~10-12:00. Remainder of cervix grossly normal. Estimate lesion to be <2cm. No vaginal or parametrial involvement.   03/30/2018 Imaging   CT scan of chest, abdomen and pelvis: 1. Isolated low-density structure lateral to the descending colon. Differential considerations include isolated peritoneal implant/metastasis, GI duplication/mesenteric cyst, or postoperative collection such as seroma (clinical history only describes prior Caesarean section). Consider further evaluation with PET. 2. Otherwise, no evidence of metastatic disease in the chest, abdomen, or pelvis.   04/04/2018 PET scan   1. Prominent hypermetabolism within the cervix, consistent with known cervical cancer. No parametrial extension of tumor or abdominopelvic nodal metastatic disease. 2. The previously demonstrated low-density structure posterior to the descending colon demonstrates no hypermetabolic activity and is likely an incidental duplication/mesenteric cyst. 3. Focal hypermetabolic activity within a single right neck lymph node (level 1B). This is unlikely to be related to the patient's cervical cancer. No hypermetabolism is seen within the pharyngeal mucosal space. ENT evaluation should be considered, especially if the patient has risk factors for head and neck malignancy.   04/28/2018 Pathology Results   1. Lymph nodes, regional resection, right pelvic - EIGHT BENIGN LYMPH NODES (0/8). 2. Lymph nodes, regional resection, left pelvic - SEVEN BENIGN LYMPH NODES (0/7). 3. Uterus, cervix and bilateral fallopian tubes, upper vagina CERVIX: -  INVASIVE POORLY DIFFERENTIATED SQUAMOUS CELL CARCINOMA, 4.5 X 2.6 X 1.0 CM. - MARGINS NOT INVOLVED. - LYMPHOVASCULAR INVOLVEMENT BY TUMOR. -  METASTATIC CARCINOMA IN TWO OF SIX PARACERVICAL LYMPH NODES (2/6). BENIGN ENDOMETRIUM AND MYOMETRIUM SEE ONCOLOGY TABLE. 4. Vagina, biopsy, anterior vaginal margin - SQUAMOUS MUCOSA WITH SLIGHT INFLAMMATION. - NO EVIDENCE OF INVASIVE CARCINOMA. Microscopic Comment 3. UTERINE CERVIX: Resection  Procedure: Hysterectomy with right and left pelvic regional lymph nodes and anterior vaginal margin. Tumor Size: 4.5 x 2.6 x 1.0 cm. Histologic Type: Squamous cell carcinoma Histologic Grade: Poorly differentiated Stromal Invasion: Yes Depth of stromal invasion (millimeters): 10 mm Horizontal extent longitudinal/length (if applicable#) (millimeters): 25 mm Horizontal extent circumferential/width (if applicable#) (millimeters): 45 mm Other Tissue/ Organ: No Margins: Free of tumor Lymphovascular: Present Regional Lymph Nodes: Two positive for tumor cells (select all that apply) Total Number of Lymph Nodes Examined: Twenty-one Number of Sentinel Nodes Examined (if applicable): 0 Pathologic Stage Classification (pTNM, AJCC 8th Edition): pT1b2, pN1 Ancillary Studies: N/A Representative Tumor Block: 3C, 3D, 3E, 37F, 3J and 3K Comment(s): The tumor is a poorly differentiated invasive squamous cell carcinoma which is up to 1.0 cm (10 mm) in thickness. There is lymphovascular space involvement within the wall of the cervix and in the paracervical connective tissue. The margins of the specimen are not involved by carcinoma (JDP:kh 04/29/18)   04/28/2018 Surgery   Surgeon: Donaciano Eva  Pre-operative Diagnosis: stage IB2 cervical cancer  Post-operative Diagnosis: same  Operation: Robotic-assisted type III radical laparoscopic hysterectomy with bilateral salpingectomy and bilateral pelvic lymphadenectomy  Surgeon: Donaciano Eva  Operative Findings:  : 3.5cm tumor replacing the anterior and right cervix. No gross parametrial involvement.         Specimens: left and right pelvic  nodes, uterus with cervix, bilateral tubes and upper vagina. Anterior vaginal margin with marking stitch at 12 o'clock of new true distal anterior vaginal margin         Complications:  None; patient tolerated the procedure well.            05/11/2018 Cancer Staging   Staging form: Cervix Uteri, AJCC 8th Edition - Pathologic stage from 05/11/2018: Stage III (pT3, pN1, cM0) - Signed by Heath Lark, MD on 05/11/2018   05/12/2018 Imaging   1. Soft tissue inflammation about the bladder could reflect cystitis. Would correlate with the patient's symptoms. 2. Small to moderate amount of free fluid within the pelvis, of uncertain significance. This may simply be postoperative in nature, though if urine output from the Foley catheter decreases, further evaluation could be considered to exclude urine leak.   05/29/2018 Procedure   Placement of a CT injectable subcutaneous port device.   06/12/2018 - 07/03/2018 Chemotherapy   She received weekly cisplatin with radiation x 4 doses. She is not able to receive any more chemo due to severe pancytopenia   10/21/2018 PET scan   1. Status post hysterectomy. No findings identified to suggest residual or recurrent FDG avid tumor within the pelvis. No evidence for abdominopelvic nodal metastasis. 2. Persistence of focal hypermetabolic activity within subcentimeter right level 1B lymph node. No hypermetabolism is seen within the pharyngeal mucosal space. ENT evaluation should be considered, especially if the patient has risk factors for head and neck malignancy.   11/30/2018 Procedure   Successful right IJ vein Port-A-Cath explant.     Measurement of disease: TBD . Marland Kitchen  Radiology: No results found. .   Outpatient Encounter Medications as of 01/31/2020  Medication Sig  .  b complex vitamins capsule Take 1 capsule by mouth daily.  . cholecalciferol (VITAMIN D3) 25 MCG (1000 UT) tablet Take 1,000 Units by mouth daily.  . Glucosamine-Chondroitin (MOVE FREE  PO) Take 1 tablet by mouth daily.   . Probiotic Product (PROBIOTIC PO) Take by mouth daily.    No facility-administered encounter medications on file as of 01/31/2020.   No Known Allergies  Past Medical History:  Diagnosis Date  . Allergy   . Arthritis    fingers  . Cervical cancer ([place]) 2019   hx chemo and radiation   . Frequent headaches    due to lack of sleep  . History of chemotherapy    last chemo 06/2018  . History of radiation therapy    last 07-25-2018  . Numbness    both hands and fingers  . PMB (postmenopausal bleeding)   . Seasonal allergies   . Squamous cell carcinoma in situ   . Tuberculosis    positive skin test  CXR clear   Past Surgical History:  Procedure Laterality Date  . CESAREAN SECTION     x 2  . ENDOMETRIAL BIOPSY  03/10/2018  . IR IMAGING GUIDED PORT INSERTION  05/29/2018  . IR REMOVAL TUN ACCESS W/ PORT W/O FL MOD SED  11/30/2018  . lymph node surgery     at 61 months old, mother told her but not sure what surgery exactly  . PELVIC LYMPH NODE DISSECTION N/A 04/28/2018   Procedure: PELVIC LYMPH NODE DISSECTION;  Surgeon: Everitt Amber, MD;  Location: [facility];  Service: Gynecology;  Laterality: N/A;  . ROBOTIC ASSISTED TOTAL HYSTERECTOMY N/A 04/28/2018   Procedure: XI ROBOTIC ASSISTED RADICAL  HYSTERECTOMY;  Surgeon: Everitt Amber, MD;  Location: [facility];  Service: Gynecology;  Laterality: N/A;  . SALPINGOOPHORECTOMY Bilateral 04/28/2018   Procedure: Marilynn Rail SALPINGO OOPHORECTOMY;  Surgeon: Everitt Amber, MD;  Location: [facility];  Service: Gynecology;  Laterality: Bilateral;        Past Gynecological History:   GYNECOLOGIC HISTORY:  . Patient's last menstrual period was 02/20/2018. age 46 . Menarche: 54 years old . P 2 . Contraceptive condoms . HRT None  . Last Pap  Referral pap SCCa Family Hx:  Family History  Problem Relation Age of Onset  . Healthy Mother   . Hypertension Mother   . Healthy Father   . Cancer Neg Hx   . Heart disease Neg Hx   .  Colon cancer Neg Hx   . Colon polyps Neg Hx   . Stomach cancer Neg Hx   . Rectal cancer Neg Hx    Social Hx:  Marland Kitchen Tobacco use: none . Alcohol use: none . Illicit Drug use: none . Illicit IV Drug use: none    Review of Systems: Review of Systems  Constitutional: Negative.   HENT:  Negative.   Eyes: Negative.   Respiratory: Negative.   Cardiovascular: Negative.   Gastrointestinal: Negative.   Endocrine: Negative.   Genitourinary: Negative for difficulty urinating and vaginal bleeding.   Musculoskeletal: Negative.   Skin: Negative.   Neurological: Negative.   Hematological: Negative.   Psychiatric/Behavioral: Negative.   All other systems reviewed and are negative.  Vitals:  Vitals:   01/31/20 1333  BP: 119/66  Pulse: 71  Resp: 17  Temp: 99.2 F (37.3 C)  SpO2: 100%   Vitals:   01/31/20 1333  Weight: 126 lb 6.4 oz (57.3 kg)  Height: 5\' 3"  (1.6 m)   Body mass index is 22.39 kg/m.  Physical Exam: General :  Well developed, 54 y.o., female in no apparent distress HEENT:  Normocephalic/atraumatic, symmetric, EOMI, eyelids normal Neck:   No visible masses.  Respiratory:  Respirations unlabored, no use of accessory muscles CV:   Deferred Breast:   Small pigmented benign appearing nevus on the left lateral mid periareola breast. No associated masses Musculoskeletal: Normal muscle strength. Abdomen:  No visible masses or protrusion. Well healed incisions Extremities:  No visible edema or deformities Skin:   Normal inspection Neuro/Psych:  No focal motor deficit, no abnormal mental status. Normal gait. Normal affect. Alert and oriented to person, place, and time  Genitourinary: Vagina significantly shortened (<8cm), smooth, atrophic, radiation changes, no masses or lesions or bleeding.    Assessment  Stage IIIC SCCa Cervix s/p chemoradiation completed March, 2020. Complete clinical response.   Plan  I recommend continued surveillance with 3 monthly evaluations with  Dr Sondra Come and myself.  While I do not feel that she needs a routine CT imaging ordered, if her symptoms of LLQ discomfort increase, she will notify us and we will order that.  paps annually in September.  Everitt Amber, MD Gynecologic Oncologist 01/31/2020, 1:55 PM

## 2020-02-03 LAB — CYTOLOGY - PAP
Comment: NEGATIVE
Diagnosis: NEGATIVE
Diagnosis: REACTIVE
High risk HPV: NEGATIVE

## 2020-02-07 ENCOUNTER — Telehealth: Payer: Self-pay

## 2020-02-07 NOTE — Telephone Encounter (Signed)
Patient returned call.  Normal pap smear and negative HPV results conveyed to patient.  Patient verbalized understanding of all information provided and is appreciative of call.

## 2020-04-19 ENCOUNTER — Encounter: Payer: Self-pay | Admitting: Family Medicine

## 2020-04-19 ENCOUNTER — Ambulatory Visit (INDEPENDENT_AMBULATORY_CARE_PROVIDER_SITE_OTHER): Payer: BLUE CROSS/BLUE SHIELD | Admitting: Family Medicine

## 2020-04-19 ENCOUNTER — Other Ambulatory Visit: Payer: Self-pay

## 2020-04-19 VITALS — BP 108/60 | HR 65 | Temp 97.9°F | Resp 16 | Ht 63.0 in | Wt 125.0 lb

## 2020-04-19 DIAGNOSIS — R140 Abdominal distension (gaseous): Secondary | ICD-10-CM

## 2020-04-19 DIAGNOSIS — R142 Eructation: Secondary | ICD-10-CM

## 2020-04-19 NOTE — Progress Notes (Signed)
Chief Complaint  Patient presents with  . Bloated   HPI: [name] is a 54 y.o. female with hx of  Constipation,cervical cancer s/p radiation, and hemorrhoids here today with translator with above complaint. 3 months of intermittent episodes of bloating sensation and frequent burping.  She has not identified exacerbating factors. Abdominal discomfort is alleviated by burping. She is having daily bowel movements. She does not think she is constipated. Stool is soft and she feels like she is emptying.  Negative for fever,chills,unusual fatigue, dysphagia,cold/heat intolerance, or urinary symptoms.  New supplements, Probiotic 3 months ago, she does not take it frequently. Negative for dietary changes.   A freids recommended being screenied for H. Pylogi.  Negative for abdominal pain,heartburn, nausea, vomiting, changes in bowel habits, blood in stool or melena.  Review of Systems  Constitutional: Negative for activity change and appetite change.  HENT: Negative for mouth sores and sore throat.   Respiratory: Negative for cough, shortness of breath and wheezing.   Genitourinary: Negative for dysuria, frequency, hematuria, vaginal bleeding and vaginal discharge.  Musculoskeletal: Negative for arthralgias, back pain and neck pain.  Allergic/Immunologic: Positive for environmental allergies.  Neurological: Negative for tremors, syncope, weakness and headaches.  Rest see pertinent positives and negatives per HPI.  Current Outpatient Medications on File Prior to Visit  Medication Sig Dispense Refill  . b complex vitamins capsule Take 1 capsule by mouth daily.    . cholecalciferol (VITAMIN D3) 25 MCG (1000 UT) tablet Take 1,000 Units by mouth daily.    . Glucosamine-Chondroitin (MOVE FREE PO) Take 1 tablet by mouth daily.     . Probiotic Product (PROBIOTIC PO) Take by mouth daily.      No current facility-administered medications on file prior to visit.    Past Medical History:   Diagnosis Date  . Allergy   . Arthritis    fingers  . Cervical cancer ([place]) 2019   hx chemo and radiation   . Frequent headaches    due to lack of sleep  . History of chemotherapy    last chemo 06/2018  . History of radiation therapy    last 07-25-2018  . Numbness    both hands and fingers  . PMB (postmenopausal bleeding)   . Seasonal allergies   . Squamous cell carcinoma in situ   . Tuberculosis    positive skin test  CXR clear   No Known Allergies  Social History   Socioeconomic History  . Marital status: Married    Spouse name: Not on file  . Number of children: 2  . Years of education: 83  . Highest education level: Not on file  Occupational History  . Occupation: student    Comment: [employer]  Tobacco Use  . Smoking status: Never Smoker  . Smokeless tobacco: Never Used  Vaping Use  . Vaping Use: Never used  Substance and Sexual Activity  . Alcohol use: Yes    Comment: occ red wine   . Drug use: No  . Sexual activity: Yes    Partners: Male    Birth control/protection: Condom, Post-menopausal, Surgical    Comment: married  Other Topics Concern  . Not on file  Social History Narrative   Holly Pena is From [place].  She lives with her husband & 2 daughters.    She attends college.    Drinks caffeine,.   Wears her seatbelt. Smoke detector in the home.    Exercises routinely.   Feels safe in her relationships.  Social Determinants of Health   Financial Resource Strain: Not on file  Food Insecurity: Not on file  Transportation Needs: Not on file  Physical Activity: Not on file  Stress: Not on file  Social Connections: Not on file    Vitals:   04/19/20 1018  BP: 108/60  Pulse: 65  Resp: 16  Temp: 97.9 F (36.6 C)  SpO2: 98%   Body mass index is 22.14 kg/m.  Physical Exam Constitutional:      General: She is not in acute distress.    Appearance: She is well-developed. She is not ill-appearing.  HENT:     Head: Atraumatic.  Eyes:     General: No  scleral icterus.    Conjunctiva/sclera: Conjunctivae normal.  Cardiovascular:     Rate and Rhythm: Normal rate and regular rhythm.     Heart sounds: No murmur heard.   Pulmonary:     Effort: Pulmonary effort is normal. No respiratory distress.     Breath sounds: Normal breath sounds.  Abdominal:     General: Bowel sounds are normal. There is no distension.     Palpations: Abdomen is soft. There is no hepatomegaly or mass.     Tenderness: There is no abdominal tenderness.  Lymphadenopathy:     Cervical: No cervical adenopathy.     Upper Body:     Right upper body: No supraclavicular adenopathy.     Left upper body: No supraclavicular adenopathy.  Skin:    General: Skin is warm.     Findings: No erythema or rash.  Neurological:     Mental Status: She is alert and oriented to person, place, and time.  Psychiatric:     Comments: Well groomed, good eye contact.   ASSESSMENT AND PLAN:  Lisaanne was seen today for bloated.  Diagnoses and all orders for this visit: Orders Placed This Encounter  Procedures  . COMPLETE METABOLIC PANEL WITH GFR  . H. pylori breath test  . TSH   Lab Results  Component Value Date   TSH 2.51 04/19/2020   Lab Results  Component Value Date   CREATININE 0.75 04/19/2020   BUN 16 04/19/2020   NA 140 04/19/2020   K 4.4 04/19/2020   CL 106 04/19/2020   CO2 27 04/19/2020   Lab Results  Component Value Date   ALT 7 04/19/2020   AST 15 04/19/2020   ALKPHOS 53 10/21/2018   BILITOT 0.7 04/19/2020   Bloating We discussed possible causes.  Hx and examination today do not suggest a serious process. Colonoscopy on 10/20/19. Avoid foods that could aggravate problem. Monitor for new symptoms. OTC Beano may also help.  Burping ? Dyspepsia. For now we will hold on treatment with PPI. We discussed symptoms of H. Pylori, which include no symptoms at all. She would like to have H pylori done.   Return if symptoms worsen or fail to improve.   Myrla Malanowski G.  [place], MD  [facility] [place] office.     A few things to remember from today's visit:   Bloating - Plan: COMPLETE METABOLIC PANEL WITH GFR, H. pylori breath test  Burping - Plan: H. pylori breath test  If you need refills please call your pharmacy. Do not use My Chart to request refills or for acute issues that need immediate attention.    Abdominal Bloating When you have abdominal bloating, your abdomen may feel full, tight, or painful. It may also look bigger than normal or swollen (distended). Common causes of  abdominal bloating include:  Swallowing air.  Constipation.  Problems digesting food.  Eating too much.  Irritable bowel syndrome. This is a condition that affects the large intestine.  Lactose intolerance. This is an inability to digest lactose, a natural sugar in dairy products.  Celiac disease. This is a condition that affects the ability to digest gluten, a protein found in some grains.  Gastroparesis. This is a condition that slows down the movement of food in the stomach and small intestine. It is more common in people with diabetes mellitus.  Gastroesophageal reflux disease (GERD). This is a digestive condition that makes stomach acid flow back into the esophagus.  Urinary retention. This means that the body is holding onto urine, and the bladder cannot be emptied all the way. Follow these instructions at home: Eating and drinking  Avoid eating too much.  Try not to swallow air while talking or eating.  Avoid eating while lying down.  Avoid these foods and drinks: ? Foods that cause gas, such as broccoli, cabbage, cauliflower, and baked beans. ? Carbonated drinks. ? Hard candy. ? Chewing gum. Medicines  Take over-the-counter and prescription medicines only as told by your health care provider.  Take probiotic medicines. These medicines contain live bacteria or yeasts that can help digestion.  Take coated peppermint oil  capsules. Activity  Try to exercise regularly. Exercise may help to relieve bloating that is caused by gas and relieve constipation. General instructions  Keep all follow-up visits as told by your health care provider. This is important. Contact a health care provider if:  You have nausea and vomiting.  You have diarrhea.  You have abdominal pain.  You have unusual weight loss or weight gain.  You have severe pain, and medicines do not help. Get help right away if:  You have severe chest pain.  You have trouble breathing.  You have shortness of breath.  You have trouble urinating.  You have darker urine than normal.  You have blood in your stools or have dark, tarry stools. Summary  Abdominal bloating means that the abdomen is swollen.  Common causes of abdominal bloating are swallowing air, constipation, and problems digesting food.  Avoid eating too much and avoid swallowing air.  Avoid foods that cause gas, carbonated drinks, hard candy, and chewing gum. This information is not intended to replace advice given to you by your health care provider. Make sure you discuss any questions you have with your health care provider. Document Revised: 08/17/2018 Document Reviewed: 05/31/2016 Elsevier Patient Education  [place].  Please be sure medication list is accurate. If a new problem present, please set up appointment sooner than planned today.

## 2020-04-19 NOTE — Patient Instructions (Signed)
A few things to remember from today's visit:   Bloating - Plan: COMPLETE METABOLIC PANEL WITH GFR, H. pylori breath test  Burping - Plan: H. pylori breath test  If you need refills please call your pharmacy. Do not use My Chart to request refills or for acute issues that need immediate attention.    Abdominal Bloating When you have abdominal bloating, your abdomen may feel full, tight, or painful. It may also look bigger than normal or swollen (distended). Common causes of abdominal bloating include:  Swallowing air.  Constipation.  Problems digesting food.  Eating too much.  Irritable bowel syndrome. This is a condition that affects the large intestine.  Lactose intolerance. This is an inability to digest lactose, a natural sugar in dairy products.  Celiac disease. This is a condition that affects the ability to digest gluten, a protein found in some grains.  Gastroparesis. This is a condition that slows down the movement of food in the stomach and small intestine. It is more common in people with diabetes mellitus.  Gastroesophageal reflux disease (GERD). This is a digestive condition that makes stomach acid flow back into the esophagus.  Urinary retention. This means that the body is holding onto urine, and the bladder cannot be emptied all the way. Follow these instructions at home: Eating and drinking  Avoid eating too much.  Try not to swallow air while talking or eating.  Avoid eating while lying down.  Avoid these foods and drinks: ? Foods that cause gas, such as broccoli, cabbage, cauliflower, and baked beans. ? Carbonated drinks. ? Hard candy. ? Chewing gum. Medicines  Take over-the-counter and prescription medicines only as told by your health care provider.  Take probiotic medicines. These medicines contain live bacteria or yeasts that can help digestion.  Take coated peppermint oil capsules. Activity  Try to exercise regularly. Exercise may help to  relieve bloating that is caused by gas and relieve constipation. General instructions  Keep all follow-up visits as told by your health care provider. This is important. Contact a health care provider if:  You have nausea and vomiting.  You have diarrhea.  You have abdominal pain.  You have unusual weight loss or weight gain.  You have severe pain, and medicines do not help. Get help right away if:  You have severe chest pain.  You have trouble breathing.  You have shortness of breath.  You have trouble urinating.  You have darker urine than normal.  You have blood in your stools or have dark, tarry stools. Summary  Abdominal bloating means that the abdomen is swollen.  Common causes of abdominal bloating are swallowing air, constipation, and problems digesting food.  Avoid eating too much and avoid swallowing air.  Avoid foods that cause gas, carbonated drinks, hard candy, and chewing gum. This information is not intended to replace advice given to you by your health care provider. Make sure you discuss any questions you have with your health care provider. Document Revised: 08/17/2018 Document Reviewed: 05/31/2016 Elsevier Patient Education  [place].  Please be sure medication list is accurate. If a new problem present, please set up appointment sooner than planned today.

## 2020-04-20 LAB — TSH: TSH: 2.51 mIU/L

## 2020-04-20 LAB — COMPLETE METABOLIC PANEL WITH GFR
AG Ratio: 1.8 (calc) (ref 1.0–2.5)
ALT: 7 U/L (ref 6–29)
AST: 15 U/L (ref 10–35)
Albumin: 4.3 g/dL (ref 3.6–5.1)
Alkaline phosphatase (APISO): 50 U/L (ref 37–153)
BUN: 16 mg/dL (ref 7–25)
CO2: 27 mmol/L (ref 20–32)
Calcium: 9.7 mg/dL (ref 8.6–10.4)
Chloride: 106 mmol/L (ref 98–110)
Creat: 0.75 mg/dL (ref 0.50–1.05)
GFR, Est African American: 105 mL/min/{1.73_m2} (ref >=60)
GFR, Est Non African American: 91 mL/min/{1.73_m2} (ref >=60)
Globulin: 2.4 g/dL (calc) (ref 1.9–3.7)
Glucose, Bld: 85 mg/dL (ref 65–99)
Potassium: 4.4 mmol/L (ref 3.5–5.3)
Sodium: 140 mmol/L (ref 135–146)
Total Bilirubin: 0.7 mg/dL (ref 0.2–1.2)
Total Protein: 6.7 g/dL (ref 6.1–8.1)

## 2020-04-20 LAB — H. PYLORI BREATH TEST: H. pylori Breath Test: NOT DETECTED

## 2020-04-23 ENCOUNTER — Encounter: Payer: Self-pay | Admitting: Family Medicine

## 2020-04-24 ENCOUNTER — Telehealth: Payer: Self-pay | Admitting: *Deleted

## 2020-04-24 NOTE — Telephone Encounter (Signed)
CALLED PATIENT TO ALTER FU APPT. ON 05-01-20 DUE TO DR. KINARD BEING ON  VACATION, RESCHEDULED FOR 05-22-20 @ 4:15 PM, LVM FOR A RETURN CALL

## 2020-04-27 IMAGING — CT CT CHEST W/ CM
2 of 5 series · 12 of 36 positions shown, 15 images · IV contrast (OMNIPAQUE)
Comparison: Pelvic ultrasound 03/17/2018.

CLINICAL DATA: New diagnosis of cervical cancer. Staging.
Postmenopausal bleeding starting 1 month ago.

EXAM:
CT CHEST, ABDOMEN, AND PELVIS WITH CONTRAST
TECHNIQUE: Multidetector CT imaging of the chest, abdomen and pelvis was
performed following the standard protocol during bolus
administration of intravenous contrast.
CONTRAST:  100mL OMNIPAQUE IOHEXOL 300 MG/ML  SOLN

[Series 2: cap with · axial · 0.70mm/px · z∈[+1141,+1656]mm · 9 of 129 slices shown, 12 images]
[im 13/129  mediastinal]
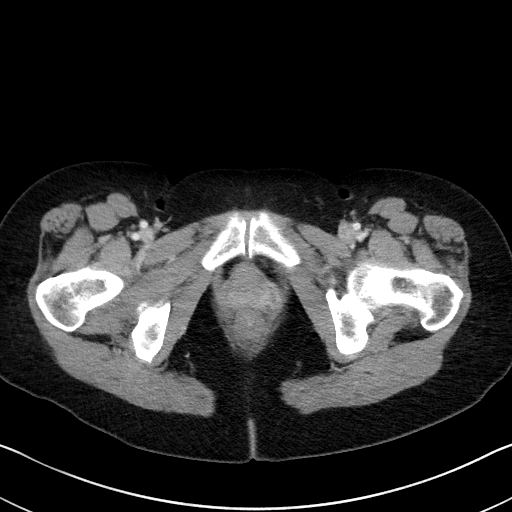
[im 13/129  lung]
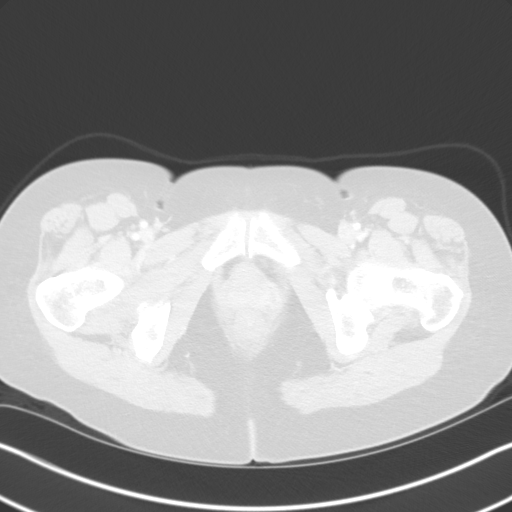
[im 26/129  lung]
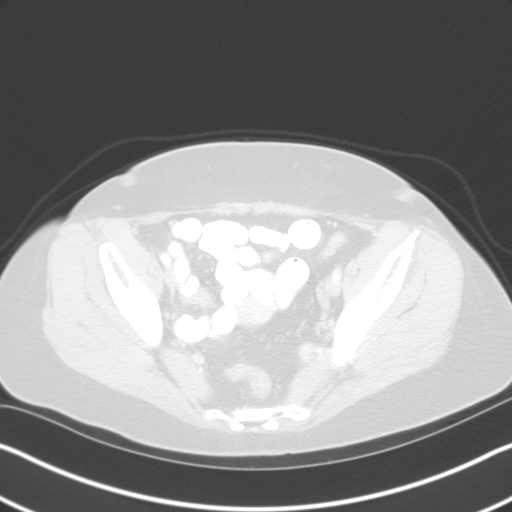
[im 39/129  lung]
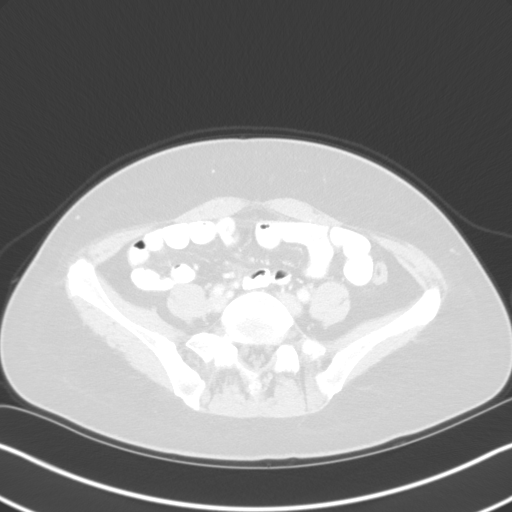
[im 52/129  lung]
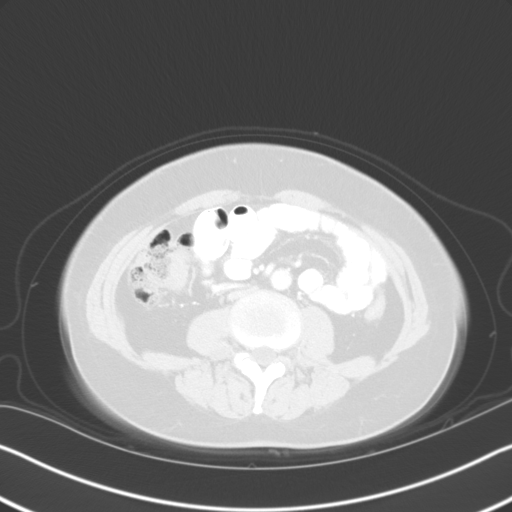
[im 65/129  mediastinal]
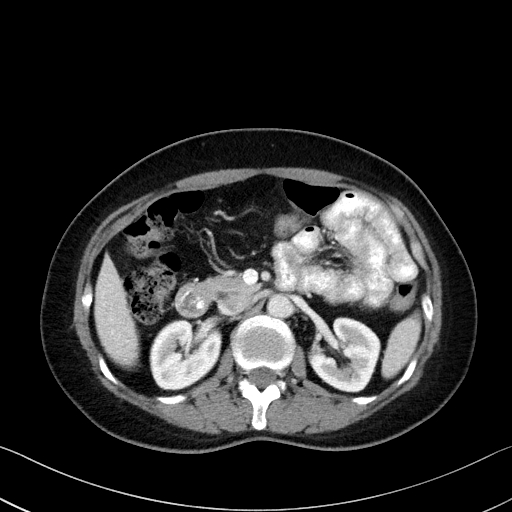
[im 65/129  lung]
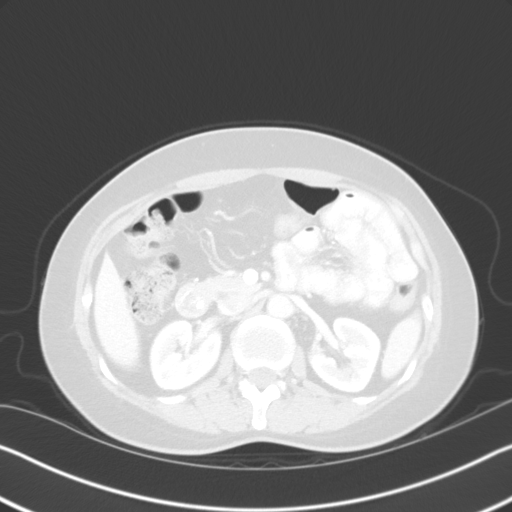
[im 77/129  lung]
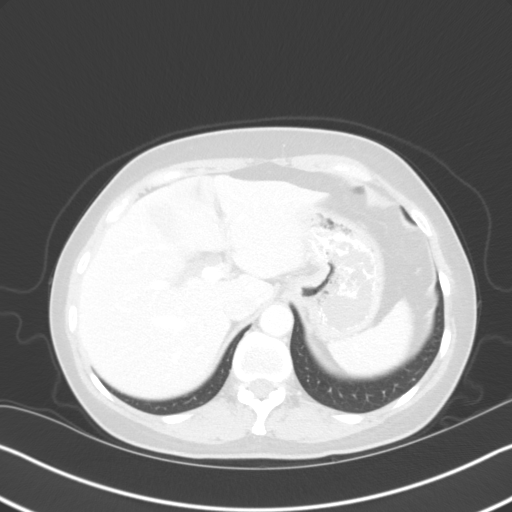
[im 90/129  lung]
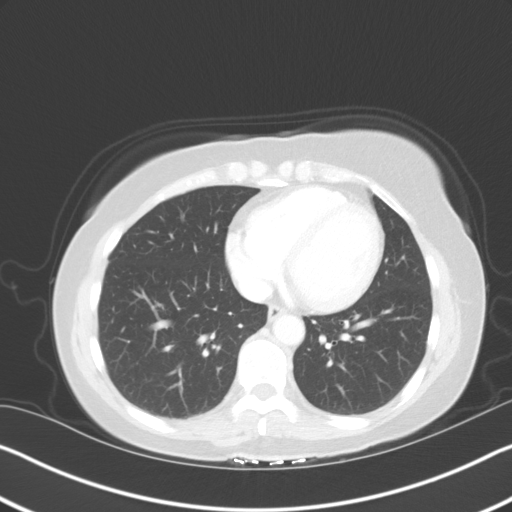
[im 103/129  lung]
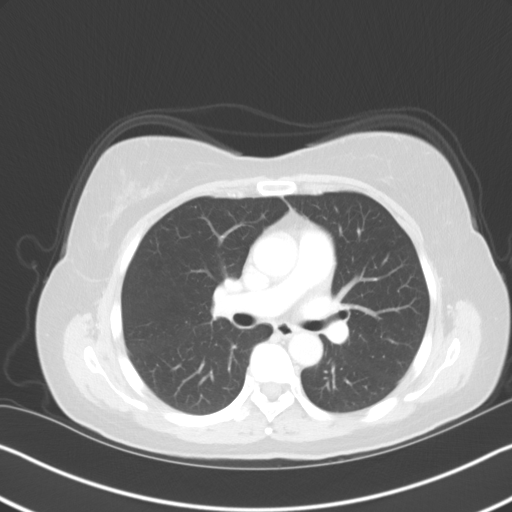
[im 116/129  mediastinal]
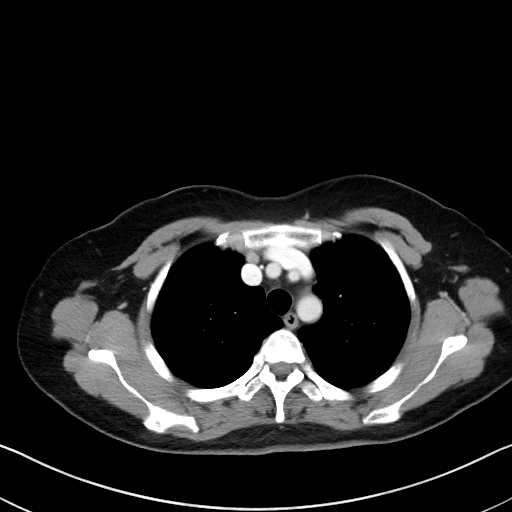
[im 116/129  lung]
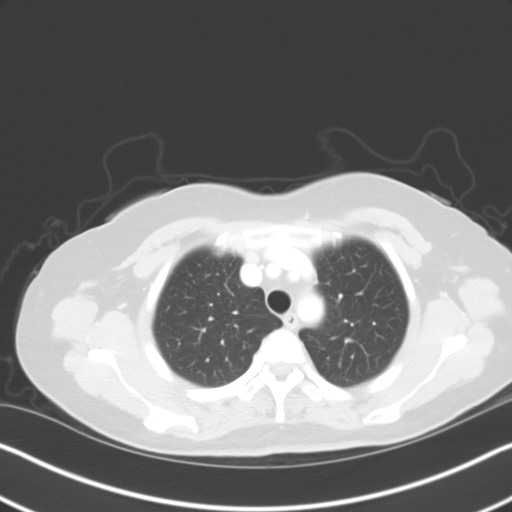

[Series 4: coronals · coronal · 0.83mm/px · 3 of 110 slices shown]
[im 22/110  lung]
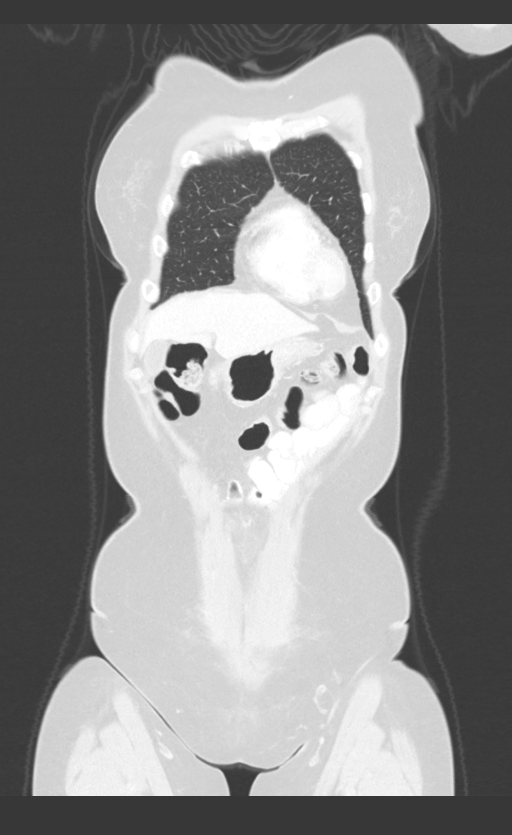
[im 44/110  lung]
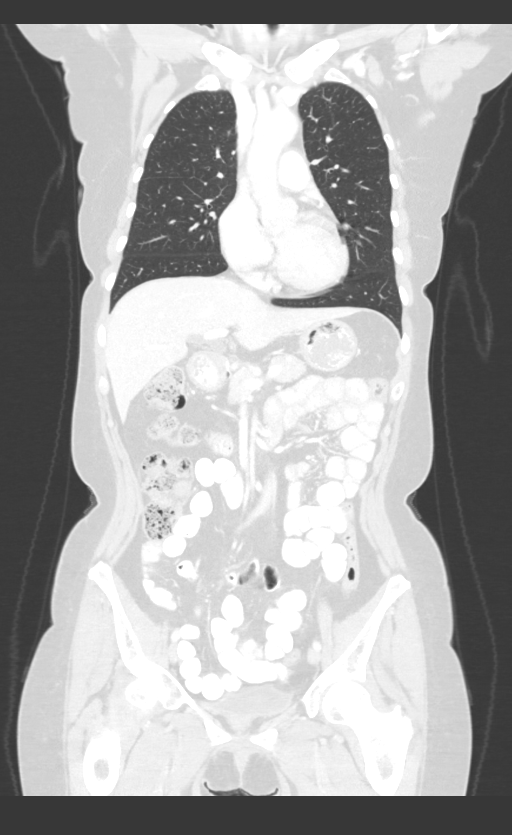
[im 66/110  lung]
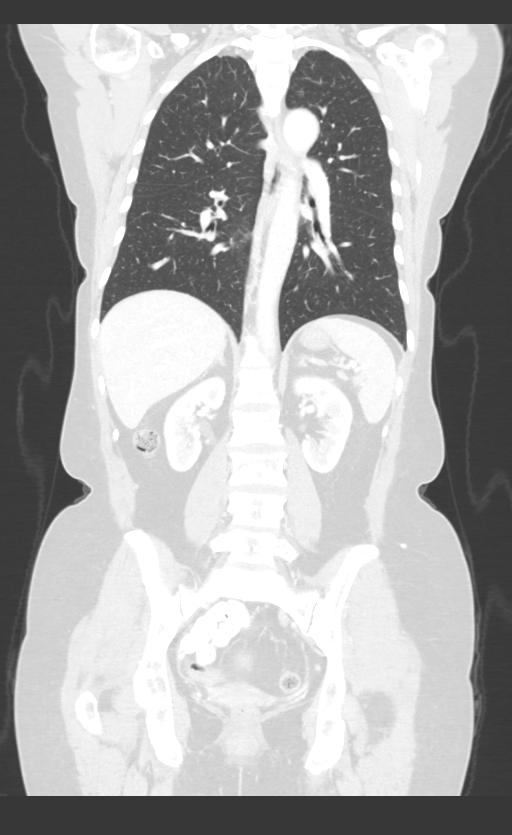

[12 of 36 positions shown; findings below may reference images not displayed]

FINDINGS: CT CHEST FINDINGS

Cardiovascular: Normal aortic caliber. Normal heart size, without
pericardial effusion. No central pulmonary embolism, on this
non-dedicated study.

Mediastinum/Nodes: No supraclavicular adenopathy. No mediastinal or
hilar adenopathy.

Lungs/Pleura: No pleural fluid.  Clear lungs.

Musculoskeletal: No acute osseous abnormality.

CT ABDOMEN PELVIS FINDINGS

Hepatobiliary: 9 mm hypoattenuating right hepatic lobe lesion on
image 58/2 is favored to represent a cyst. A central right hepatic
lobe 3 mm lesion is too small to characterize but also most likely a
cyst. Normal gallbladder, without biliary ductal dilatation.

Pancreas: Normal, without mass or ductal dilatation.

Spleen: Normal in size, without focal abnormality.

Adrenals/Urinary Tract: Normal adrenal glands. Normal kidneys,
without hydronephrosis. Normal urinary bladder.

Stomach/Bowel: Normal stomach, without wall thickening. Normal
colon, appendix, and terminal ileum. Normal small bowel.

Vascular/Lymphatic: Normal caliber of the aorta and branch vessels.
No abdominopelvic adenopathy.

Reproductive: Suspect mild soft tissue fullness within the cervix.
No gross parametrial extension. No adnexal mass.

Other: No significant free fluid. An isolated low-density lesion
lateral to the descending colon measures 2.2 x 2.2 cm on image 85/2.
This has calcification in its posterior portion.

Musculoskeletal: Sclerotic lesion in the right sacrum is likely a
bone island.
IMPRESSION: 1. Isolated low-density structure lateral to the descending colon.
Differential considerations include isolated peritoneal
implant/metastasis, GI duplication/mesenteric cyst, or postoperative
collection such as seroma (clinical history only describes prior
Caesarean section). Consider further evaluation with PET.
2. Otherwise, no evidence of metastatic disease in the chest,
abdomen, or pelvis.

## 2020-05-01 ENCOUNTER — Ambulatory Visit: Payer: PRIVATE HEALTH INSURANCE | Admitting: Radiation Oncology

## 2020-05-11 ENCOUNTER — Encounter: Payer: Self-pay | Admitting: Radiation Oncology

## 2020-05-11 ENCOUNTER — Ambulatory Visit
Admission: RE | Admit: 2020-05-11 | Discharge: 2020-05-11 | Disposition: A | Discharge status: Home / Self Care | Payer: PRIVATE HEALTH INSURANCE | Source: Ambulatory Visit | Attending: Radiation Oncology | Admitting: Radiation Oncology

## 2020-05-11 ENCOUNTER — Other Ambulatory Visit: Payer: Self-pay

## 2020-05-11 DIAGNOSIS — Z8541 Personal history of malignant neoplasm of cervix uteri: Secondary | ICD-10-CM | POA: Diagnosis present

## 2020-05-11 DIAGNOSIS — Z923 Personal history of irradiation: Secondary | ICD-10-CM | POA: Diagnosis not present

## 2020-05-11 DIAGNOSIS — C539 Malignant neoplasm of cervix uteri, unspecified: Secondary | ICD-10-CM

## 2020-05-11 NOTE — Progress Notes (Signed)
Patient here for a f/u visit with Dr. Roselind Messier. Patient denies any pain, bleeding or GI/GU problems.  BP 103/87 (BP Location: Right Arm, Patient Position: Sitting, Cuff Size: Normal)   Pulse (!) 56   Temp (!) 97.4 F (36.3 C)   Resp 18   Ht 5\' 3"  (1.6 m)   Wt 126 lb 6.4 oz (57.3 kg)   LMP 02/20/2018 Comment: no period in 2 years  SpO2 100%   BMI 22.39 kg/m   Wt Readings from Last 3 Encounters:  05/11/20 126 lb 6.4 oz (57.3 kg)  04/19/20 125 lb (56.7 kg)  01/31/20 126 lb 6.4 oz (57.3 kg)

## 2020-05-11 NOTE — Progress Notes (Signed)
Radiation Oncology         [phone] ________________________________  Name: Holly Pena MRN: [id number]  Date: 05/11/2020  DOB: 10/07/1965  Follow-Up Visit Note  CC: Martin[place], Betty G, MD  Everitt Amber, MD    ICD-10-CM   1. 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 pelvis / 25 fractions x 1.8 Gy for a total of 45 Gy 2. Central pelvis Boost / 3 fractions x 1.8 Gy for a total of 5.4 Gy  Narrative: The patient returns today for routine follow-up. She is doing well overall. Chest x-ray performed on 10/28/2019 for anterior right rib pain was unremarkable. Since her last visit, she underwent a colonoscopy on 12/03/2019. Pathology from the procedure revealed tubular adenoma, negative for high-grade dysplasia, of one polyp and another hyperplastic polyp. There was also noted to be colonic mucosa with underlying lymphoid aggregate, negative for dysplasia.  She was last seen by Dr. Denman George on 01/31/2020, during which time she had reported some intermittent LLQ abdominal pain. While it was not felt that she was in need of routine CT imaging, they did discuss ordering it if pain continued. PAP smear at that time showed benign reactive/reparative changes associated with radiation effect and was negative for intraepithelial lesion or malignancy. Also negative for high risk HPV  On review of systems, she reports resolution of her lower quadrant pain. She denies pelvic pain vaginal bleeding or discharge.  She denies any rectal bleeding.  She denies any hematuria.  Patient continues to use her vaginal dilator.  She reports no bleeding when using the vaginal dilator..                      ALLERGIES:  has No Known  Allergies.  Meds: Current Outpatient Medications  Medication Sig Dispense Refill  . b complex vitamins capsule Take 1 capsule by mouth daily.    . cholecalciferol (VITAMIN D3) 25 MCG (1000 UT) tablet Take 1,000 Units by mouth daily.    . Glucosamine-Chondroitin (MOVE FREE PO) Take 1 tablet by mouth daily.     . Probiotic Product (PROBIOTIC PO) Take by mouth daily.      No current facility-administered medications for this encounter.    Physical Findings: The patient is in no acute distress. Patient is alert and oriented.  height is 5\' 3"  (1.6 m) and weight is 126 lb 6.4 oz (57.3 kg). Her temperature is 97.4 F (36.3 C) (abnormal). Her blood pressure is 103/87 and her pulse is 56 (abnormal). Her respiration is 18 and oxygen saturation is 100%.   Lungs are clear to auscultation bilaterally. Heart has regular rate and rhythm. No palpable cervical, supraclavicular, or axillary adenopathy. Abdomen soft, non-tender, normal bowel sounds.  On pelvic examination the external genitalia were unremarkable. A speculum exam was performed. There are no mucosal lesions noted in the vaginal vault. On bimanual there were no pelvic masses appreciated. Some radiation changes noted in the proximal vagina. The vaginal vault is narrowed and shortened but does permit index finger examination.  Vaginal length is approximately 6 cm.  Pediatric speculum was used for the exam.  Lab Findings: Lab Results  Component Value Date   WBC 3.6 (L) 01/29/2019   HGB 13.1  01/29/2019   HCT 38.5 01/29/2019   MCV 88.1 01/29/2019   PLT 161 01/29/2019    Radiographic Findings: No results found.  Impression:  Stage III-C(pT1b2, pN1)poorly differentiated squamous cell carcinoma of the cervix.  No evidence of recurrence on clinical exam today.   Plan: The patient will follow up with Dr. Andrey Farmer in three months and with radiation oncology in nine months since the patient will be 2 years out from her treatment.  Total time  spent in this encounter was 25 minutes which included reviewing the patient's most recent chest x-ray, follow-up with Dr. Andrey Farmer, colonoscopy, PAP smear, physical examination, and documentation.  ____________________________________  Billie Lade, PhD, MD  This document serves as a record of services personally performed by Antony Blackbird, MD. It was created on his behalf by Nikki Dom, a trained medical scribe. The creation of this record is based on the scribe's personal observations and the provider's statements to them. This document has been checked and approved by the attending provider.

## 2020-05-21 IMAGING — MG DIGITAL SCREENING BILATERAL MAMMOGRAM WITH CAD
4 series · 4 of 4 positions shown · non-contrast
Comparison: None.

CLINICAL DATA: Screening.

EXAM:
DIGITAL SCREENING BILATERAL MAMMOGRAM WITH CAD

[L CC]
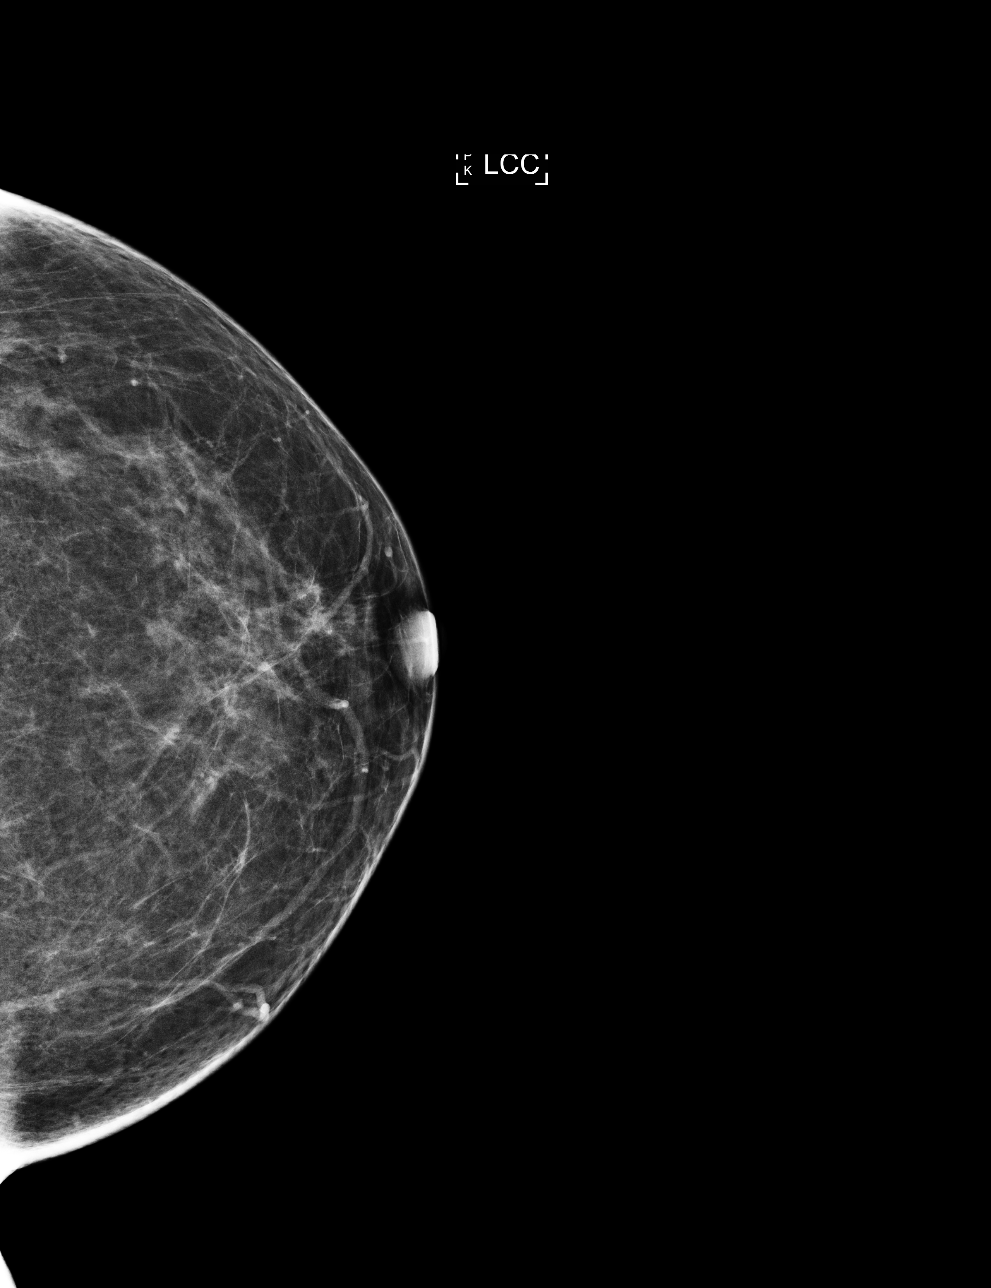

[L MLO]
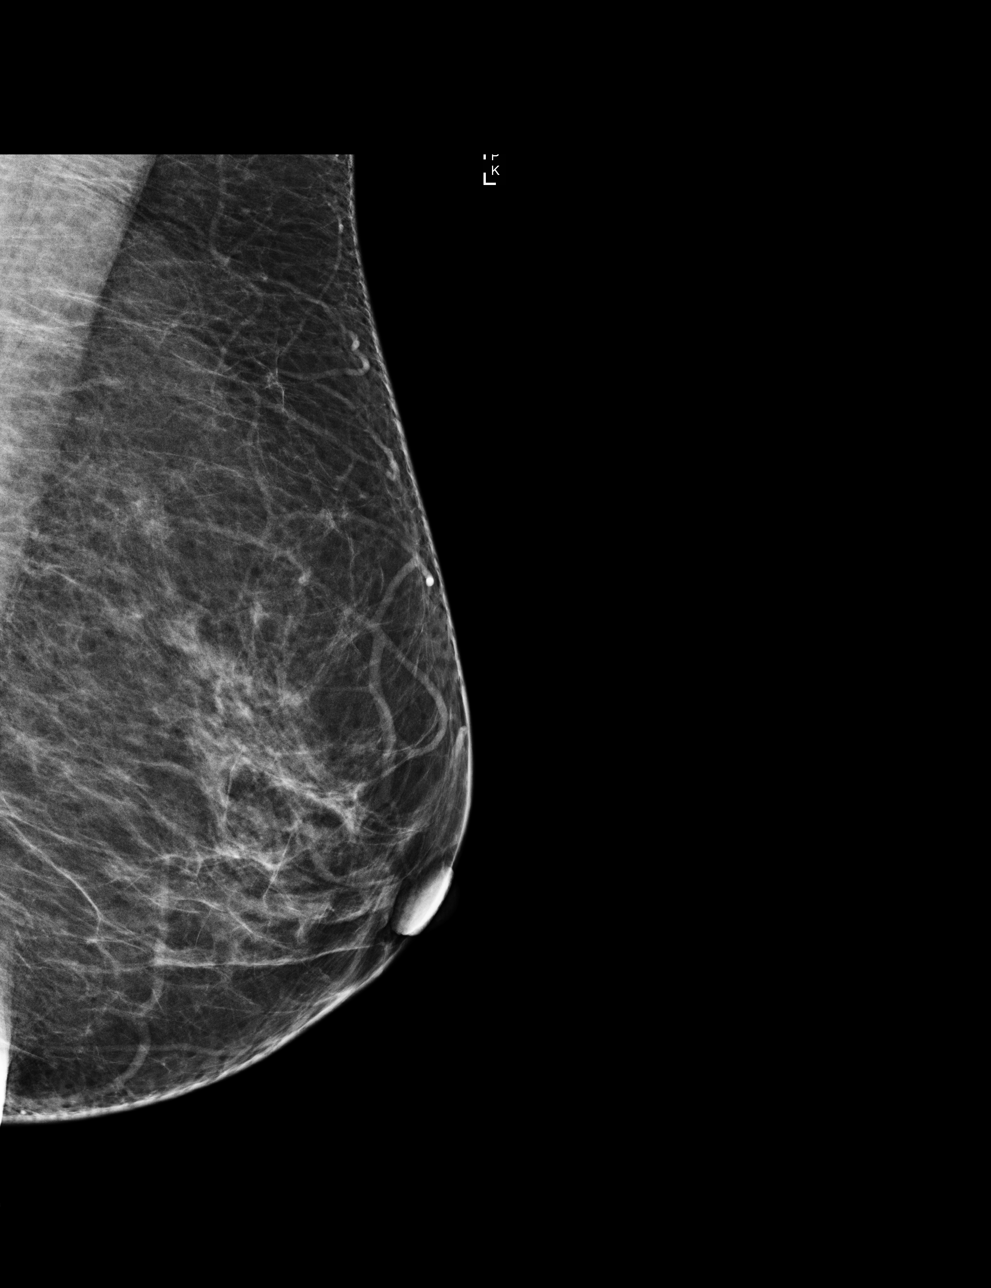

[R CC]
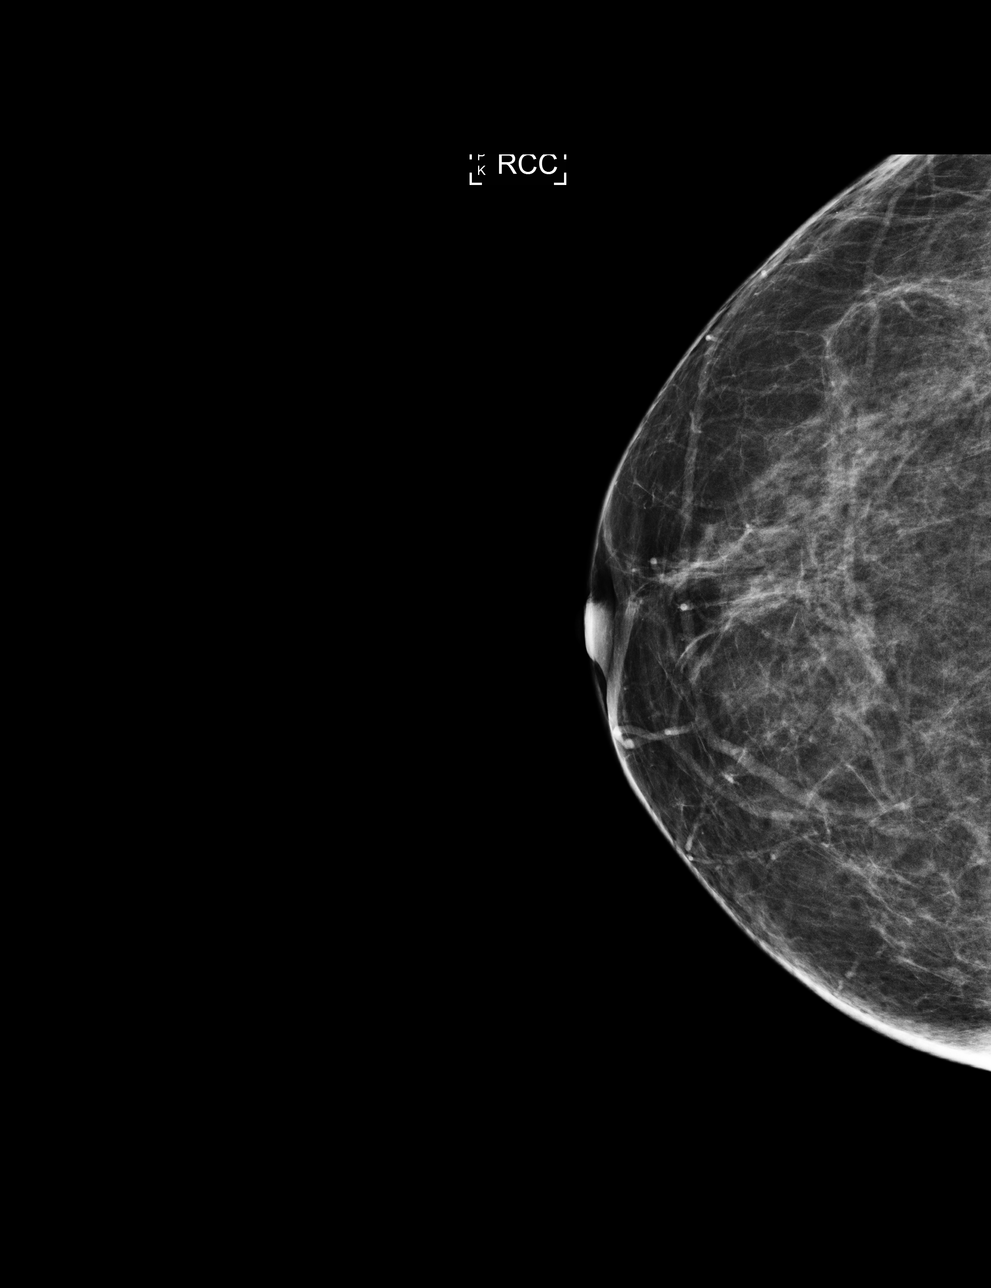

[R MLO]
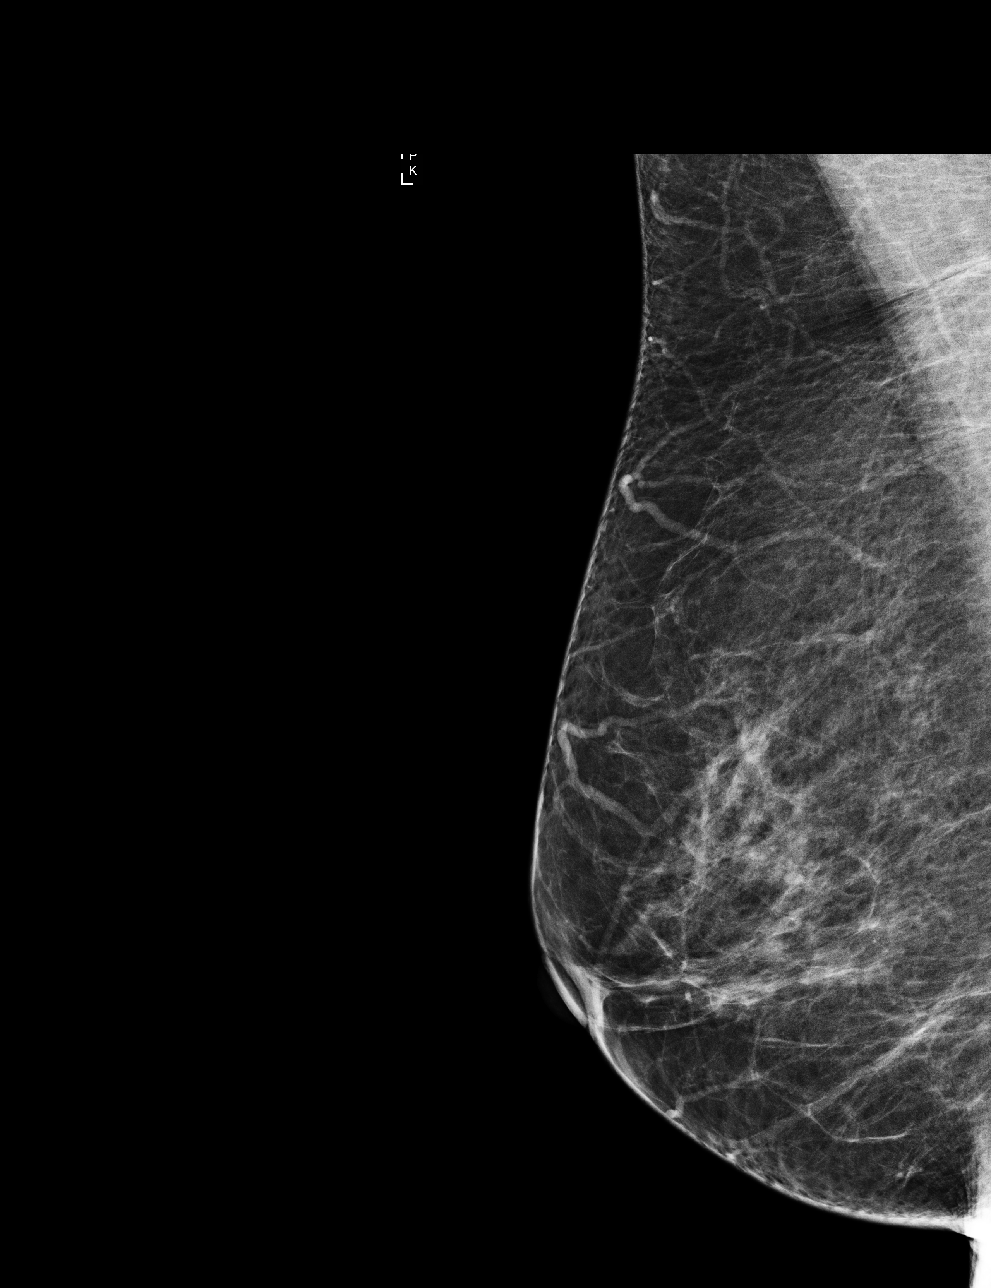

[4 of 4 positions shown; findings below may reference images not displayed]

ACR Breast Density Category b: There are scattered areas of
fibroglandular density.
FINDINGS: There are no findings suspicious for malignancy. Images were
processed with CAD.
IMPRESSION: No mammographic evidence of malignancy. A result letter of this
screening mammogram will be mailed directly to the patient.

RECOMMENDATION:
Screening mammogram in one year. (Code:[id number])

BI-RADS CATEGORY  1: Negative.

## 2020-06-14 ENCOUNTER — Telehealth: Payer: Self-pay | Admitting: *Deleted

## 2020-06-14 NOTE — Telephone Encounter (Signed)
CALLED PATIENT'S INTERP. [place] TO INFORM OF FU APPT. WITH DR. Denman George ON 07-24-20 - ARRIVAL TIME- 2:45 PM, LVM FOR A RETURN CALL

## 2020-06-14 NOTE — Telephone Encounter (Signed)
Holly Pena from radiation called and scheduled the patient for a follow up in March. Appt scheduled and Holly Pena will contact the patient

## 2020-07-21 NOTE — Progress Notes (Signed)
[place] at [facility]   Progress Note: Established Patient Follow-Up Visit   Consult was originally requested by Dr. Mora Bellman for cervical cancer  Chief Complaint  Patient presents with  . Malignant neoplasm of cervix, unspecified site [facility])    GYN Oncologic Summary 1. Stage IIIC SCCa Cervix S/p radical hysterectomy, BSO, lymphadenectomy on 04/28/18  HPI: [name]  is a very nice 55 y.o.  P2  Interval History  She reports no residual toxicities of therapy. She has persistent mild bilateral pelvic pains which have been present for many years (since treatment).   Oncologic Course She went through menopause ~2017. On February 20, 2018 she noted post-menopausal bleeding that lasted ~ 2 weeks (intermittent). She presented to her PCP who referred her on to Dr. Elly Modena. On exam she was noted to have a bulky and friable cervix. A Pap and EMB were performed.   A TVUS was ordered and done 03/17/18 revealing a normal uterus with 57mm EM lining. No adnexal masses.  Pap and EMB revealed squamous cell CA.  She is thus referred for management and recommendations.  Her last pelvic/Pap was at the time of her last pregnancy ~16 years ago  Dr Gerarda Fraction took her to the operating room for an EUA and cervical biopsies due to intolerance of examination in the office. Findings as noted:  Anterior cervical lip with gross lesion from ~10-12:00. Remainder of cervix grossly normal. Estimate lesion to be <2cm. No vaginal or parametrial involvement.   Grossly the remainder of the cervix appeared normal. It was firm on palpation but no fungating lesions. Pathology confirmed what the Pap/EMB showed 1. Cervix, biopsy, 12 o'clock - INVASIVE MODERATELY DIFFERENTIATED SQUAMOUS CELL CARCINOMA. SEE NOTE. 2. Cervix, biopsy, 3 o'clock - HIGH GRADE SQUAMOUS INTRAEPITHELIAL LESION (CIN-III, HIGH GRADE DYSPLASIA). SEE NOTE. 3. Cervix, biopsy, 9 o'clock - INVASIVE  MODERATELY DIFFERENTIATED SQUAMOUS CELL CARCINOMA. SEE NOTE. 4. Cervix, biopsy, 6 o'clock - HIGH GRADE SQUAMOUS INTRAEPITHELIAL LESION (CIN-III, HIGH GRADE DYSPLASIA). SEE NOTE. Diagnosis Note 1. 2, 3 and 4. Dr. Melina Copa has reviewed this case and concurs with the above interpretation. (NDK:gt, 03/25/18) [name] MD   Dr Gerarda Fraction staged her clinically as a Stage IB1 (FIGO 2018) SCCa Cervix and ordered a PET/CT. Because Dr Gerarda Fraction stated that the tumor was <2cm, she counseled the patient that she was a candidate for either MIS or open approach. The patient elected for MIS approach and therefore elected to have surgery with Dr Denman George as Dr Gerarda Fraction does not perform radical hysterectomy via a MIS route.   PET/CT resulted with no apparent metastatic disease (though there was a mildly PET avid node in the neck felt to be unrelated). She had a 3.5cm area of increased avidity in the cervix.   The patient elected for robotic assisted hysterectomy, BSO, lymphadenectomy.   On April 28, 2018 she underwent robotic assisted type III radical laparoscopic hysterectomy with bilateral salpingectomy and bilateral pelvic lymphadenectomy.  Surgery was uncomplicated.  Operative findings is significant for 3.5 cm tumor placed in the anterior and right cervix with no gross parametrial involvement.  Pathology revealed a 4.5 x 2.6 x 1 cm invasive, poorly differentiated squamous cell carcinoma.  This resided in the cervix.  Margins were not involved.  There was lymphovascular involvement by the tumor.  2 of 6 paracervical lymph nodes were positive for metastatic carcinoma.  The right and left pelvic lymphadenectomy specimens were negative for metastatic disease.  She was determined to  have high risk features for recurrence with deep cervical stromal involvement, large sized tumor greater than 4 cm, lymphovascular space invasion, and metastatic involvement of lymph nodes.  In accordance with NCCN guidelines she was  recommended adjuvant radiation with chemosensitization with cisplatin.  Postoperatively she was seen in the office for evaluation for a voiding trial.  She was initially unable to void urine spontaneously after Foley removal and required replacement and eventual intermittent self catheterization.   She went on to complete chemoradiation for advanced stage cervical cancer between January 30 and July 21, 2018.  She received a total of 45 Gray external beam radiation to the pelvis and 25 fractions, with a central pelvic boost of 3 fractions for a total of 5.4 Gray to the central pelvis.  She received weekly radiosensitizing cisplatin chemotherapy.  During therapy she developed neutropenia.  Post treatment restaging PET scan on October 21, 2018 which did not show any findings for residual or recurrent disease in the pelvis.  It did show persistence of a focal hypermetabolic activity and a subcentimeter neck node. ENT evaluation was negative for a pharyngeal lesion.  Pap in September, 2020 was negative and negative for high risk HPV.   Imported EPIC Oncologic History:  Oncology History  Cervical cancer ([place])  02/20/2018 Initial Diagnosis   She presented with postmenopausal bleeding   03/10/2018 Pathology Results   Endometrium, biopsy - SQUAMOUS CELL CARCINOMA. - SEE COMMENT.   03/17/2018 Imaging   US pelvis: Endometrium measures 7 mm. In the setting of post-menopausal bleeding, endometrial sampling is indicated to exclude carcinoma   03/24/2018 Pathology Results   1. Cervix, biopsy, 12 o'clock - INVASIVE MODERATELY DIFFERENTIATED SQUAMOUS CELL CARCINOMA. SEE NOTE. 2. Cervix, biopsy, 3 o'clock - HIGH GRADE SQUAMOUS INTRAEPITHELIAL LESION (CIN-III, HIGH GRADE DYSPLASIA). SEE NOTE. 3. Cervix, biopsy, 9 o'clock - INVASIVE MODERATELY DIFFERENTIATED SQUAMOUS CELL CARCINOMA. SEE NOTE. 4. Cervix, biopsy, 6 o'clock - HIGH GRADE SQUAMOUS INTRAEPITHELIAL LESION (CIN-III, HIGH GRADE DYSPLASIA). SEE  NOTE.   03/24/2018 Procedure   Pre-operative Diagnosis:  Suspect SCCa Cervix based on endometrial biopsy  Post-operative Diagnosis:  Stage IB1 (FIGO 2018) SCCa Cervix  Operation:  Exam under anesthesia Cervical biopsies  Operative Findings:  Anterior cervical lip with gross lesion from ~10-12:00. Remainder of cervix grossly normal. Estimate lesion to be <2cm. No vaginal or parametrial involvement.   03/30/2018 Imaging   CT scan of chest, abdomen and pelvis: 1. Isolated low-density structure lateral to the descending colon. Differential considerations include isolated peritoneal implant/metastasis, GI duplication/mesenteric cyst, or postoperative collection such as seroma (clinical history only describes prior Caesarean section). Consider further evaluation with PET. 2. Otherwise, no evidence of metastatic disease in the chest, abdomen, or pelvis.   04/04/2018 PET scan   1. Prominent hypermetabolism within the cervix, consistent with known cervical cancer. No parametrial extension of tumor or abdominopelvic nodal metastatic disease. 2. The previously demonstrated low-density structure posterior to the descending colon demonstrates no hypermetabolic activity and is likely an incidental duplication/mesenteric cyst. 3. Focal hypermetabolic activity within a single right neck lymph node (level 1B). This is unlikely to be related to the patient's cervical cancer. No hypermetabolism is seen within the pharyngeal mucosal space. ENT evaluation should be considered, especially if the patient has risk factors for head and neck malignancy.   04/28/2018 Pathology Results   1. Lymph nodes, regional resection, right pelvic - EIGHT BENIGN LYMPH NODES (0/8). 2. Lymph nodes, regional resection, left pelvic - SEVEN BENIGN LYMPH NODES (0/7). 3. Uterus, cervix and  bilateral fallopian tubes, upper vagina CERVIX: - INVASIVE POORLY DIFFERENTIATED SQUAMOUS CELL CARCINOMA, 4.5 X 2.6 X 1.0 CM. - MARGINS NOT  INVOLVED. - LYMPHOVASCULAR INVOLVEMENT BY TUMOR. - METASTATIC CARCINOMA IN TWO OF SIX PARACERVICAL LYMPH NODES (2/6). BENIGN ENDOMETRIUM AND MYOMETRIUM SEE ONCOLOGY TABLE. 4. Vagina, biopsy, anterior vaginal margin - SQUAMOUS MUCOSA WITH SLIGHT INFLAMMATION. - NO EVIDENCE OF INVASIVE CARCINOMA. Microscopic Comment 3. UTERINE CERVIX: Resection  Procedure: Hysterectomy with right and left pelvic regional lymph nodes and anterior vaginal margin. Tumor Size: 4.5 x 2.6 x 1.0 cm. Histologic Type: Squamous cell carcinoma Histologic Grade: Poorly differentiated Stromal Invasion: Yes Depth of stromal invasion (millimeters): 10 mm Horizontal extent longitudinal/length (if applicable#) (millimeters): 25 mm Horizontal extent circumferential/width (if applicable#) (millimeters): 45 mm Other Tissue/ Organ: No Margins: Free of tumor Lymphovascular: Present Regional Lymph Nodes: Two positive for tumor cells (select all that apply) Total Number of Lymph Nodes Examined: Twenty-one Number of Sentinel Nodes Examined (if applicable): 0 Pathologic Stage Classification (pTNM, AJCC 8th Edition): pT1b2, pN1 Ancillary Studies: N/A Representative Tumor Block: 3C, 3D, 3E, 68F, 3J and 3K Comment(s): The tumor is a poorly differentiated invasive squamous cell carcinoma which is up to 1.0 cm (10 mm) in thickness. There is lymphovascular space involvement within the wall of the cervix and in the paracervical connective tissue. The margins of the specimen are not involved by carcinoma (JDP:kh 04/29/18)   04/28/2018 Surgery   Surgeon: Donaciano Eva  Pre-operative Diagnosis: stage IB2 cervical cancer  Post-operative Diagnosis: same  Operation: Robotic-assisted type III radical laparoscopic hysterectomy with bilateral salpingectomy and bilateral pelvic lymphadenectomy  Surgeon: Donaciano Eva  Operative Findings:  : 3.5cm tumor replacing the anterior and right cervix. No gross parametrial  involvement.         Specimens: left and right pelvic nodes, uterus with cervix, bilateral tubes and upper vagina. Anterior vaginal margin with marking stitch at 12 o'clock of new true distal anterior vaginal margin         Complications:  None; patient tolerated the procedure well.            05/11/2018 Cancer Staging   Staging form: Cervix Uteri, AJCC 8th Edition - Pathologic stage from 05/11/2018: Stage III (pT3, pN1, cM0) - Signed by Heath Lark, MD on 05/11/2018   05/12/2018 Imaging   1. Soft tissue inflammation about the bladder could reflect cystitis. Would correlate with the patient's symptoms. 2. Small to moderate amount of free fluid within the pelvis, of uncertain significance. This may simply be postoperative in nature, though if urine output from the Foley catheter decreases, further evaluation could be considered to exclude urine leak.   05/29/2018 Procedure   Placement of a CT injectable subcutaneous port device.   06/12/2018 - 07/03/2018 Chemotherapy   She received weekly cisplatin with radiation x 4 doses. She is not able to receive any more chemo due to severe pancytopenia   10/21/2018 PET scan   1. Status post hysterectomy. No findings identified to suggest residual or recurrent FDG avid tumor within the pelvis. No evidence for abdominopelvic nodal metastasis. 2. Persistence of focal hypermetabolic activity within subcentimeter right level 1B lymph node. No hypermetabolism is seen within the pharyngeal mucosal space. ENT evaluation should be considered, especially if the patient has risk factors for head and neck malignancy.   11/30/2018 Procedure   Successful right IJ vein Port-A-Cath explant.     Measurement of disease: TBD . Marland Kitchen  Radiology: No results found. .   Outpatient Encounter Medications  as of 07/24/2020  Medication Sig  . b complex vitamins capsule Take 1 capsule by mouth daily.  . cholecalciferol (VITAMIN D3) 25 MCG (1000 UT) tablet Take 1,000 Units  by mouth daily.  . Glucosamine-Chondroitin (MOVE FREE PO) Take 1 tablet by mouth daily.   . Probiotic Product (PROBIOTIC PO) Take by mouth daily.    No facility-administered encounter medications on file as of 07/24/2020.   No Known Allergies  Past Medical History:  Diagnosis Date  . Allergy   . Arthritis    fingers  . Cervical cancer ([place]) 2019   hx chemo and radiation   . Frequent headaches    due to lack of sleep  . History of chemotherapy    last chemo 06/2018  . History of radiation therapy    last 07-25-2018  . Numbness    both hands and fingers  . PMB (postmenopausal bleeding)   . Seasonal allergies   . Squamous cell carcinoma in situ   . Tuberculosis    positive skin test  CXR clear   Past Surgical History:  Procedure Laterality Date  . CESAREAN SECTION     x 2  . ENDOMETRIAL BIOPSY  03/10/2018  . IR IMAGING GUIDED PORT INSERTION  05/29/2018  . IR REMOVAL TUN ACCESS W/ PORT W/O FL MOD SED  11/30/2018  . lymph node surgery     at 33 months old, mother told her but not sure what surgery exactly  . PELVIC LYMPH NODE DISSECTION N/A 04/28/2018   Procedure: PELVIC LYMPH NODE DISSECTION;  Surgeon: Everitt Amber, MD;  Location: [facility];  Service: Gynecology;  Laterality: N/A;  . ROBOTIC ASSISTED TOTAL HYSTERECTOMY N/A 04/28/2018   Procedure: XI ROBOTIC ASSISTED RADICAL  HYSTERECTOMY;  Surgeon: Everitt Amber, MD;  Location: [facility];  Service: Gynecology;  Laterality: N/A;  . SALPINGOOPHORECTOMY Bilateral 04/28/2018   Procedure: Marilynn Rail SALPINGO OOPHORECTOMY;  Surgeon: Everitt Amber, MD;  Location: [facility];  Service: Gynecology;  Laterality: Bilateral;        Past Gynecological History:   GYNECOLOGIC HISTORY:  . Patient's last menstrual period was 02/20/2018. age 63 . Menarche: 55 years old . P 2 . Contraceptive condoms . HRT None  . Last Pap  Referral pap SCCa Family Hx:  Family History  Problem Relation Age of Onset  . Healthy Mother   . Hypertension Mother   . Healthy  Father   . Cancer Neg Hx   . Heart disease Neg Hx   . Colon cancer Neg Hx   . Colon polyps Neg Hx   . Stomach cancer Neg Hx   . Rectal cancer Neg Hx    Social Hx:  Marland Kitchen Tobacco use: none . Alcohol use: none . Illicit Drug use: none . Illicit IV Drug use: none    Review of Systems: Review of Systems  Constitutional: Negative.   HENT:  Negative.   Eyes: Negative.   Respiratory: Negative.   Cardiovascular: Negative.   Gastrointestinal: Negative.   Endocrine: Negative.   Genitourinary: Negative for difficulty urinating and vaginal bleeding.   Musculoskeletal: Negative.   Skin: Negative.   Neurological: Negative.   Hematological: Negative.   Psychiatric/Behavioral: Negative.   All other systems reviewed and are negative.  Vitals:  Vitals:   07/24/20 1522  BP: 112/75  Pulse: 70  Resp: 18  Temp: (!) 97.5 F (36.4 C)  SpO2: 100%   Vitals:   07/24/20 1522  Weight: 130 lb (59 kg)  Height: 5\' 3"  (1.6 m)  Body mass index is 23.03 kg/m.  Physical Exam: General :  Well developed, 55 y.o., female in no apparent distress HEENT:  Normocephalic/atraumatic, symmetric, EOMI, eyelids normal Neck:   No visible masses.  Respiratory:  Respirations unlabored, no use of accessory muscles CV:   Deferred Breast:   Small pigmented benign appearing nevus on the left lateral mid periareola breast. No associated masses Musculoskeletal: Normal muscle strength. Abdomen:  No visible masses or protrusion. Well healed incisions Extremities:  No visible edema or deformities Skin:   Normal inspection Neuro/Psych:  No focal motor deficit, no abnormal mental status. Normal gait. Normal affect. Alert and oriented to person, place, and time  Genitourinary: Vagina significantly shortened (<8cm), smooth, atrophic, radiation changes, no masses or lesions or bleeding.    Assessment  Stage IIIC SCCa Cervix s/p chemoradiation completed March, 2020. Complete clinical response.   Plan  I recommend  continued surveillance with 6 monthly evaluations with Dr Sondra Come and myself.  While I do not feel that she needs a routine CT imaging ordered, if her symptoms of LLQ discomfort increase, she will notify us and we will order that.  I recommend paps with high risk HPV screening annually in September.  Everitt Amber, MD Gynecologic Oncologist 07/24/2020, 3:55 PM

## 2020-07-24 ENCOUNTER — Inpatient Hospital Stay: Payer: 59 | Attending: Gynecologic Oncology | Admitting: Gynecologic Oncology

## 2020-07-24 ENCOUNTER — Other Ambulatory Visit: Payer: Self-pay

## 2020-07-24 ENCOUNTER — Encounter: Payer: Self-pay | Admitting: Gynecologic Oncology

## 2020-07-24 VITALS — BP 112/75 | HR 70 | Temp 97.5°F | Resp 18 | Ht 63.0 in | Wt 130.0 lb

## 2020-07-24 DIAGNOSIS — Z8541 Personal history of malignant neoplasm of cervix uteri: Secondary | ICD-10-CM | POA: Insufficient documentation

## 2020-07-24 DIAGNOSIS — Z90722 Acquired absence of ovaries, bilateral: Secondary | ICD-10-CM | POA: Diagnosis not present

## 2020-07-24 DIAGNOSIS — Z923 Personal history of irradiation: Secondary | ICD-10-CM | POA: Diagnosis not present

## 2020-07-24 DIAGNOSIS — Z9221 Personal history of antineoplastic chemotherapy: Secondary | ICD-10-CM | POA: Diagnosis not present

## 2020-07-24 DIAGNOSIS — Z90710 Acquired absence of both cervix and uterus: Secondary | ICD-10-CM | POA: Insufficient documentation

## 2020-07-24 DIAGNOSIS — Z08 Encounter for follow-up examination after completed treatment for malignant neoplasm: Secondary | ICD-10-CM | POA: Diagnosis present

## 2020-07-24 DIAGNOSIS — C539 Malignant neoplasm of cervix uteri, unspecified: Secondary | ICD-10-CM

## 2020-07-24 NOTE — Patient Instructions (Signed)
Please notify Dr Denman George at phone number [phone] if you notice vaginal bleeding, new pelvic or abdominal pains, bloating, feeling full easy, or a change in bladder or bowel function.   Please have Dr Clabe Seal office contact Dr Serita Grit office (at [phone]) in September after your appointment with him to request an appointment with Dr Denman George for March, 2023.

## 2021-02-07 NOTE — Progress Notes (Signed)
Radiation Oncology         [phone] ________________________________  Name: Holly Pena MRN: [id number]  Date: 02/08/2021  DOB: [birth year]  Follow-Up Visit Note  CC: [place]ique, Betty G, MD  Everitt Amber, MD    ICD-10-CM   1. Malignant neoplasm of cervix, unspecified site [facility])  C53.9 Cytology - PAP      Diagnosis: Stage III-C (pT1b2, pN1) poorly differentiated squamous cell carcinoma of the cervix   Interval Since Last Radiation:  2 years, 6 months and 19 days   Radiation treatment dates:  06/11/2018 - 07/21/2018   Site/dose: (post-op radiation therapy) 1. pelvis / 25 fractions x 1.8 Gy for a total of 45 Gy 2. Central pelvis Boost / 3 fractions x 1.8 Gy for a total of 5.4 Gy  Narrative: The patient returns today for routine follow-up, she was last seen her for follow-up on 05/11/2020.             Since her last visit, the patient followed up with Dr. Denman George on 07/24/20. During which time, the patient reported mild bilateral pelvic pains which have been present for many years (since treatment). If LLQ patient were to increase, the patient was informed to notify Dr. Denman George and schedule routine CT.                     Pertinent imaging since the patient was last seen includes a bilateral screening mammogram performed on 10/20/2019 which demonstrated no evidence of malignancy in either breast.   Chest x-ray performed on 10/28/2019 revealed no evidence of cardiopulmonary disease.  She denies any abdominal bloating or the pelvic pain.  She occasionally notices some discomfort along the left upper inner thigh area but no actual pain in this area.  She denies any vaginal bleeding or discharge.  She is using her vaginal dilator 2-3 times per week.  She reports no bleeding with dilator use.    Allergies:  has No Known Allergies.  Meds: Current Outpatient Medications  Medication Sig Dispense Refill   b complex vitamins capsule Take 1 capsule by mouth daily. (Patient not taking: Reported on  02/08/2021)     cholecalciferol (VITAMIN D3) 25 MCG (1000 UT) tablet Take 1,000 Units by mouth daily. (Patient not taking: Reported on 02/08/2021)     Glucosamine-Chondroitin (MOVE FREE PO) Take 1 tablet by mouth daily.  (Patient not taking: Reported on 02/08/2021)     Probiotic Product (PROBIOTIC PO) Take by mouth daily.  (Patient not taking: Reported on 02/08/2021)     No current facility-administered medications for this encounter.    Physical Findings: The patient is in no acute distress. Patient is alert and oriented.  height is 5\' 3"  (1.6 m) and weight is 131 lb 3.2 oz (59.5 kg). Her temperature is 97.7 F (36.5 C). Her blood pressure is 120/82 and her pulse is 60. Her respiration is 18 and oxygen saturation is 100%. .  No significant changes. Lungs are clear to auscultation bilaterally. Heart has regular rate and rhythm. No palpable cervical, supraclavicular, or axillary adenopathy. Abdomen soft, non-tender, normal bowel sounds.  On pelvic examination the external genitalia are unremarkable.  A careful palpation along the left upper inner thigh area reveals no nodularity or mass.  A speculum exam is performed to.  Exam would permit a small speculum but a pediatric speculum was needed to obtain Pap smear.  There were no mucosal lesions noted in the vaginal vault.  Some radiation changes noted in the proximal  vagina.  A Pap smear was obtained of the proximal vagina.  On bimanual and rectovaginal examination there were no pelvic masses appreciated.  The patient tolerated the rectal portion of the exam poorly.  Vaginal vault is narrowed and shortened from her previous surgery and radiation effect.   Lab Findings: Lab Results  Component Value Date   WBC 3.6 (L) 01/29/2019   HGB 13.1 01/29/2019   HCT 38.5 01/29/2019   MCV 88.1 01/29/2019   PLT 161 01/29/2019    Radiographic Findings: No results found.  Impression:  Stage III-C (pT1b2, pN1) poorly differentiated squamous cell carcinoma of the  cervix   No evidence of recurrence on clinical exam today.  Pap smear pending.  Patient reports no long-term effects from her surgery or radiation therapy.  Plan: Routine follow-up in 1 year.  The patient will be seen by gynecologic oncology in 6 months.   25 minutes of total time was spent for this patient encounter, including preparation, face-to-face counseling with the patient and coordination of care, physical exam, obtaining Pap smear  and documentation of the encounter. ____________________________________  Blair Promise, PhD, MD   This document serves as a record of services personally performed by Gery Pray, MD. It was created on his behalf by Roney Mans, a trained medical scribe. The creation of this record is based on the scribe's personal observations and the provider's statements to them. This document has been checked and approved by the attending provider.

## 2021-02-08 ENCOUNTER — Ambulatory Visit
Admission: RE | Admit: 2021-02-08 | Discharge: 2021-02-08 | Disposition: A | Discharge status: Home / Self Care | Payer: 59 | Source: Ambulatory Visit | Attending: Radiation Oncology | Admitting: Radiation Oncology

## 2021-02-08 ENCOUNTER — Encounter: Payer: Self-pay | Admitting: Radiation Oncology

## 2021-02-08 ENCOUNTER — Other Ambulatory Visit (HOSPITAL_COMMUNITY)
Admission: RE | Admit: 2021-02-08 | Discharge: 2021-02-08 | Disposition: A | Discharge status: Home / Self Care | Payer: 59 | Source: Ambulatory Visit | Attending: Radiation Oncology | Admitting: Radiation Oncology

## 2021-02-08 ENCOUNTER — Other Ambulatory Visit: Payer: Self-pay

## 2021-02-08 VITALS — BP 120/82 | HR 60 | Temp 97.7°F | Resp 18 | Ht 63.0 in | Wt 131.2 lb

## 2021-02-08 DIAGNOSIS — Z923 Personal history of irradiation: Secondary | ICD-10-CM | POA: Insufficient documentation

## 2021-02-08 DIAGNOSIS — Z8541 Personal history of malignant neoplasm of cervix uteri: Secondary | ICD-10-CM | POA: Insufficient documentation

## 2021-02-08 DIAGNOSIS — C539 Malignant neoplasm of cervix uteri, unspecified: Secondary | ICD-10-CM | POA: Insufficient documentation

## 2021-02-08 NOTE — Progress Notes (Signed)
Holly Pena is here today for follow up post radiation to the pelvic.  They completed their radiation on: 07/21/18  Does the patient complain of any of the following:  Pain:Patient reports having some tenderness to left groin area. Patient reports area feels swollen.  Abdominal bloating: no Diarrhea/Constipation: no Nausea/Vomiting: no Vaginal Discharge: no Blood in Urine or Stool: no Urinary Issues (dysuria/incomplete emptying/ incontinence/ increased frequency/urgency): no Does patient report using vaginal dilator 2-3 times a week and/or sexually active 2-3 weeks: Yes patient reports using dilators.  Post radiation skin changes: no   Additional comments if applicable:   Vitals:   02/08/21 0815  BP: 120/82  Pulse: 60  Resp: 18  Temp: 97.7 F (36.5 C)  SpO2: 100%  Weight: 131 lb 3.2 oz (59.5 kg)  Height: 5\' 3"  (1.6 m)

## 2021-02-15 LAB — CYTOLOGY - PAP
Comment: NEGATIVE
High risk HPV: NEGATIVE

## 2021-06-06 ENCOUNTER — Telehealth: Payer: Self-pay | Admitting: *Deleted

## 2021-06-06 NOTE — Telephone Encounter (Signed)
CALLED PATIENT TO INFORM OF FU APPT. WITH DR. Berline Lopes ON 07-12-21- ARRIVAL TIME-1:30 PM, LVM FOR A RETURN CALL

## 2021-07-10 ENCOUNTER — Telehealth: Payer: Self-pay

## 2021-07-10 NOTE — Telephone Encounter (Signed)
Received call from patient this afternoon. Patient would like to cancel her appointment on 07/12/21 with Dr. Berline Lopes. She reports her insurance has changed and she will call us in about a month once she has her new insurance to schedule.

## 2021-07-12 ENCOUNTER — Ambulatory Visit: Payer: 59 | Admitting: Gynecologic Oncology

## 2021-09-26 ENCOUNTER — Telehealth: Payer: Self-pay | Admitting: *Deleted

## 2021-09-26 NOTE — Telephone Encounter (Signed)
RETURNED PATIENT'S PHONE CALL, SPOKE WITH MS. Ayler AND GAVE HER AN APPT. WITH DR. Berline Lopes ON 10-26-21 - ARRIVAL TIME- 2:45 PM, SPOKE WITH PATIENT AND SHE VERIFIED UNDERSTANDING THIS APPT. ?

## 2021-10-25 ENCOUNTER — Telehealth: Payer: Self-pay

## 2021-10-25 NOTE — Telephone Encounter (Signed)
Left voicemail, via [employer] ID# [id number], regarding appointment tomorrow. Pt to call back so meaningful use can be updated.

## 2021-10-26 ENCOUNTER — Other Ambulatory Visit: Payer: Self-pay

## 2021-10-26 ENCOUNTER — Encounter: Payer: Self-pay | Admitting: Gynecologic Oncology

## 2021-10-26 ENCOUNTER — Inpatient Hospital Stay: Payer: 59 | Attending: Gynecologic Oncology | Admitting: Gynecologic Oncology

## 2021-10-26 VITALS — BP 114/80 | HR 77 | Temp 98.0°F | Resp 16 | Ht 63.0 in | Wt 134.3 lb

## 2021-10-26 DIAGNOSIS — Z90710 Acquired absence of both cervix and uterus: Secondary | ICD-10-CM | POA: Diagnosis not present

## 2021-10-26 DIAGNOSIS — Z923 Personal history of irradiation: Secondary | ICD-10-CM | POA: Diagnosis not present

## 2021-10-26 DIAGNOSIS — R1030 Lower abdominal pain, unspecified: Secondary | ICD-10-CM | POA: Diagnosis not present

## 2021-10-26 DIAGNOSIS — Z9221 Personal history of antineoplastic chemotherapy: Secondary | ICD-10-CM | POA: Insufficient documentation

## 2021-10-26 DIAGNOSIS — Z8541 Personal history of malignant neoplasm of cervix uteri: Secondary | ICD-10-CM | POA: Diagnosis not present

## 2021-10-26 DIAGNOSIS — Z90722 Acquired absence of ovaries, bilateral: Secondary | ICD-10-CM | POA: Diagnosis not present

## 2021-10-26 DIAGNOSIS — C539 Malignant neoplasm of cervix uteri, unspecified: Secondary | ICD-10-CM

## 2021-10-26 DIAGNOSIS — M79659 Pain in unspecified thigh: Secondary | ICD-10-CM | POA: Insufficient documentation

## 2021-10-26 NOTE — Patient Instructions (Addendum)
It was nice to meet you today.  I do not see or feel any evidence of cancer recurrence on your exam.  Because of the pain that you are having in your groins, we will get a CT scan.  If this comes back negative for any evidence of cancer, I suspect that your symptoms are related to swelling after your surgery and radiation treatment.  In that case, compression socks and stockings may help with the swelling and the pain you are having.  You will see Dr. Sondra Come in September and have a Pap test performed at that time.  Would like to see you 6 months after that, in March 2024.  Please call sometime after the new year to get that visit scheduled with me.  If you develop any new and concerning symptoms before then, such as vaginal bleeding or pelvic pain, please call to see me sooner.

## 2021-10-26 NOTE — Progress Notes (Signed)
Holly Pena, Holly Pena/2019 Imaging   US pelvis: Pena measures 7 mm. In the setting of post-menopausal bleeding, endometrial sampling is indicated to exclude carcinoma   03/24/2018 Pathology Results   1. Cervix, Holly, 12 o'clock - INVASIVE MODERATELY DIFFERENTIATED SQUAMOUS CELL CARCINOMA. SEE NOTE. 2. Cervix, Holly, 3 o'clock - HIGH GRADE SQUAMOUS INTRAEPITHELIAL LESION (CIN-III, HIGH GRADE DYSPLASIA). SEE NOTE. 3. Cervix, Holly, 9 o'clock - INVASIVE MODERATELY DIFFERENTIATED SQUAMOUS CELL CARCINOMA. SEE NOTE. 4. Cervix, Holly, 6 o'clock - HIGH GRADE SQUAMOUS INTRAEPITHELIAL LESION (CIN-III, HIGH GRADE DYSPLASIA). SEE NOTE.   03/24/2018 Procedure   Pre-operative Diagnosis:  Suspect SCCa Cervix based on endometrial Holly   Post-operative Diagnosis:  Stage IB1 (FIGO 2018) SCCa Cervix   Operation:  Exam under anesthesia Cervical biopsies   Operative Findings:  Anterior cervical lip with gross lesion from ~10-12:00. Remainder of cervix grossly normal. Estimate lesion to be <2cm. No vaginal or parametrial involvement.   03/30/2018 Imaging   CT scan of chest, abdomen and pelvis: 1. Isolated low-density structure lateral to the descending colon. Differential considerations include isolated peritoneal implant/metastasis, GI duplication/mesenteric cyst, or postoperative collection such as seroma (clinical history only describes prior Caesarean section). Consider further evaluation with PET. 2. Otherwise, no evidence of metastatic disease in the chest, abdomen, or pelvis.   04/04/2018 PET scan   1. Prominent  hypermetabolism within the cervix, consistent with known cervical cancer. No parametrial extension of tumor or abdominopelvic nodal metastatic disease. 2. The previously demonstrated low-density structure posterior to the descending colon demonstrates no hypermetabolic activity and is likely an incidental duplication/mesenteric cyst. 3. Focal hypermetabolic activity within a single right neck lymph node (level 1B). This is unlikely to be related to the patient's cervical cancer. No hypermetabolism is seen within the pharyngeal mucosal space. ENT evaluation should be considered, especially if the patient has risk factors for head and neck malignancy.   04/28/2018 Pathology Results   1. Lymph nodes, regional resection, right pelvic - EIGHT BENIGN LYMPH NODES (0/8). 2. Lymph nodes, regional resection, left pelvic - SEVEN BENIGN LYMPH NODES (0/7). 3. Uterus, cervix and bilateral fallopian tubes, upper vagina CERVIX: - INVASIVE POORLY DIFFERENTIATED SQUAMOUS CELL CARCINOMA, 4.5 X 2.6 X 1.0 CM. - MARGINS NOT INVOLVED. - LYMPHOVASCULAR INVOLVEMENT BY TUMOR. - METASTATIC CARCINOMA IN TWO OF SIX PARACERVICAL LYMPH NODES (2/6). BENIGN Pena AND MYOMETRIUM SEE ONCOLOGY TABLE. 4. Vagina, Holly, anterior vaginal margin - SQUAMOUS MUCOSA WITH SLIGHT INFLAMMATION. - NO EVIDENCE OF INVASIVE CARCINOMA. Microscopic Comment 3. UTERINE CERVIX: Resection  Procedure: Hysterectomy with right and left pelvic regional lymph nodes and anterior vaginal margin. Tumor Size: 4.5 x 2.6 x 1.0 cm. Histologic Type: Squamous cell carcinoma Histologic Grade: Poorly differentiated Stromal Invasion: Yes Depth of stromal invasion (millimeters): 10 mm Horizontal extent longitudinal/length (if applicable#) (millimeters): 25 mm Horizontal extent circumferential/width (if applicable#) (millimeters): 45 mm Other Tissue/ Organ: No Margins: Free of tumor Lymphovascular: Present Regional Lymph Nodes: Two positive for  tumor cells (select all that apply) Total Number of Lymph Nodes Examined: Twenty-one Number of Sentinel Nodes Examined (if applicable): 0 Pathologic Stage Classification (pTNM, AJCC 8th Edition): pT1b2, pN1 Ancillary Studies: N/A Representative Tumor Block: 3C, 3D, 3E, 54F,  3J and 3K Comment(s): The tumor is a poorly differentiated invasive squamous cell carcinoma which is up to 1.0 cm (10 mm) in thickness. There is lymphovascular space involvement within the wall of the cervix and in the paracervical connective tissue. The margins of the specimen are not involved by carcinoma (JDP:kh 04/29/18)   04/28/2018 Surgery   Surgeon: Donaciano Eva  Pre-operative Diagnosis: stage IB2 cervical cancer   Post-operative Diagnosis: same   Operation: Robotic-assisted type III radical laparoscopic hysterectomy with bilateral salpingectomy and bilateral pelvic lymphadenectomy   Surgeon: Donaciano Eva    Operative Findings:  : 3.5cm tumor replacing the anterior and right cervix. No gross parametrial involvement.          Specimens: left and right pelvic nodes, uterus with cervix, bilateral tubes and upper vagina. Anterior vaginal margin with marking stitch at 12 o'clock of new true distal anterior vaginal margin         Complications:  None; patient tolerated the procedure well.            05/11/2018 Cancer Staging   Staging form: Cervix Uteri, AJCC 8th Edition - Pathologic stage from 05/11/2018: Stage III (pT3, pN1, cM0) - Signed by Heath Lark, MD on 05/11/2018   05/12/2018 Imaging   1. Soft tissue inflammation about the bladder could reflect cystitis. Would correlate with the patient's symptoms. 2. Small to moderate amount of free fluid within the pelvis, of uncertain significance. This may simply be postoperative in nature, though if urine output from the Foley catheter decreases, further evaluation could be considered to exclude urine leak.   05/29/2018 Procedure   Placement of a CT  injectable subcutaneous port device.   06/12/2018 - 07/03/2018 Chemotherapy   She received weekly cisplatin with radiation x 4 doses. She is not able to receive any more chemo due to severe pancytopenia   10/21/2018 PET scan   1. Status post hysterectomy. No findings identified to suggest residual or recurrent FDG avid tumor within the pelvis. No evidence for abdominopelvic nodal metastasis. 2. Persistence of focal hypermetabolic activity within subcentimeter right level 1B lymph node. No hypermetabolism is seen within the pharyngeal mucosal space. ENT evaluation should be considered, especially if the patient has risk factors for head and neck malignancy.   11/30/2018 Procedure   Successful right IJ vein Port-A-Cath explant.     Interval History: She was last seen in September, with was NED at that time.  Pap smear was performed.  Patient reports overall doing well.  She denies any vaginal bleeding or discharge.  She reports normal bowel function.  Her bladder function has improved.  For the last 6-12 months, she notes intermittent pain in bilateral groins and along the backside of her inner thigh, which she describes as being pain from "lymph nodes".  She does not think that this is worsened since she first developed the pain.  She rates it between a 4 and 5 out of 10.  It frequently happens when she is sitting or walking.  She denies any radiation of the pain.  She has some difficulty describing the character of the pain.  Notes intermittent mild lower extremity edema.  Past Medical/Surgical History: Past Medical History:  Diagnosis Date   Allergy    Arthritis    fingers   Cervical cancer ([place]) 2019   hx chemo and radiation    Frequent headaches    due to lack of sleep   History of chemotherapy    last chemo 06/2018  History of radiation therapy    last 07-25-2018   Numbness    both hands and fingers   PMB (postmenopausal bleeding)    Seasonal allergies    Squamous cell carcinoma  in situ    Tuberculosis    positive skin test  CXR clear    Past Surgical History:  Procedure Laterality Date   CESAREAN SECTION     x 2   ENDOMETRIAL Holly  03/10/2018   IR IMAGING GUIDED PORT INSERTION  05/29/2018   IR REMOVAL TUN ACCESS W/ PORT W/O FL MOD SED  11/30/2018   lymph node surgery     at 35 months old, mother told her but not sure what surgery exactly   PELVIC LYMPH NODE DISSECTION N/A 04/28/2018   Procedure: PELVIC LYMPH NODE DISSECTION;  Surgeon: Everitt Amber, MD;  Location: [facility];  Service: Gynecology;  Laterality: N/A;   ROBOTIC ASSISTED TOTAL HYSTERECTOMY N/A 04/28/2018   Procedure: XI ROBOTIC ASSISTED RADICAL  HYSTERECTOMY;  Surgeon: Everitt Amber, MD;  Location: [facility];  Service: Gynecology;  Laterality: N/A;   SALPINGOOPHORECTOMY Bilateral 04/28/2018   Procedure: Marilynn Rail SALPINGO OOPHORECTOMY;  Surgeon: Everitt Amber, MD;  Location: [facility];  Service: Gynecology;  Laterality: Bilateral;    Family History  Problem Relation Age of Onset   Healthy Mother    Hypertension Mother    Healthy Father    Cancer Neg Hx    Heart disease Neg Hx    Colon cancer Neg Hx    Colon polyps Neg Hx    Stomach cancer Neg Hx    Rectal cancer Neg Hx     Social History   Socioeconomic History   Marital status: Married    Spouse name: Not on file   Number of children: 2   Years of education: 14   Highest education level: Not on file  Occupational History   Occupation: student    Comment: [employer]  Tobacco Use   Smoking status: Never   Smokeless tobacco: Never  Vaping Use   Vaping Use: Never used  Substance and Sexual Activity   Alcohol use: Yes    Comment: occ red wine    Drug use: No   Sexual activity: Yes    Partners: Male    Birth control/protection: Condom, Post-menopausal, Surgical    Comment: married  Other Topics Concern   Not on file  Social History Narrative   Ms. Christenbury is From [place].  She lives with her husband & 2 daughters.    She attends college.    Drinks  caffeine,.   Wears her seatbelt. Smoke detector in the home.    Exercises routinely.   Feels safe in her relationships.   Social Determinants of Health   Financial Resource Strain: Not on file  Food Insecurity: Not on file  Transportation Needs: Not on file  Physical Activity: Not on file  Stress: Not on file  Social Connections: Not on file    Current Medications:  Current Outpatient Medications:    b complex vitamins capsule, Take 1 capsule by mouth daily. (Patient not taking: Reported on 02/08/2021), Disp: , Rfl:    cholecalciferol (VITAMIN D3) 25 MCG (1000 UT) tablet, Take 1,000 Units by mouth daily. (Patient not taking: Reported on 02/08/2021), Disp: , Rfl:    Glucosamine-Chondroitin (MOVE FREE PO), Take 1 tablet by mouth daily.  (Patient not taking: Reported on 02/08/2021), Disp: , Rfl:    Probiotic Product (PROBIOTIC PO), Take by mouth daily.  (Patient not taking:  Reported on 02/08/2021), Disp: , Rfl:   Review of Systems: Denies appetite changes, fevers, chills, fatigue, unexplained weight changes. Denies hearing loss, neck lumps or masses, mouth sores, ringing in ears or voice changes. Denies cough or wheezing.  Denies shortness of breath. Denies chest pain or palpitations. Denies leg swelling. Denies abdominal distention, pain, blood in stools, constipation, diarrhea, nausea, vomiting, or early satiety. Denies pain with intercourse, dysuria, frequency, hematuria or incontinence. Denies hot flashes, pelvic pain, vaginal bleeding or vaginal discharge.   Denies joint pain, back pain or muscle pain/cramps. Denies itching, rash, or wounds. Denies dizziness, headaches, numbness or seizures. Denies easy bruising or bleeding. Denies anxiety, depression, confusion, or decreased concentration.  Physical Exam: BP 114/80 (BP Location: Left Arm, Patient Position: Sitting)   Pulse 77   Temp 98 F (36.7 C) (Oral)   Resp 16   Ht '5\' 3"'$  (1.6 m)   Wt 134 lb 4.8 oz (60.9 kg)   LMP  02/20/2018 Comment: no period in 2 years  SpO2 99%   BMI 23.79 kg/m  General: Alert, oriented, no acute distress. HEENT: Normocephalic, atraumatic, sclera anicteric. Chest: Clear to auscultation bilaterally.  No wheezes or rhonchi. Cardiovascular: Regular rate and rhythm, no murmurs. Abdomen: soft, nontender.  Normoactive bowel sounds.  No masses or hepatosplenomegaly appreciated.  Well-healed scar. Extremities: Grossly normal range of motion.  Warm, well perfused.  No edema bilaterally. Skin: No rashes or lesions noted. Lymphatics: No cervical, supraclavicular, or inguinal adenopathy. GU: Normal appearing external genitalia without erythema, excoriation, or lesions.  Speculum exam reveals moderately atrophic vaginal mucosa with significant radiation changes and foreshortened vagina.  No lesions or masses noted.  Bimanual exam reveals vaginal mucosa smooth, no nodularity or masses.  Rectovaginal exam confirms findings.  Laboratory & Radiologic Studies: Pap test from 02/08/2021: Atrophic pattern with epithelial atypia, high risk HPV negative  Assessment & Plan: Lasheba Stevens is a 56 y.o. woman with Stage IIIC SCC of the cervix who completed primary chemoradiation in 07/2018 with a complete clinical response who presents for surveillance visit.  Patient is doing well and is NED on exam today.  Given continued groin and inner thigh pain, recommended that we proceed with a CT of her pelvis.  At her visit approximately a year ago, she was having left lower quadrant pain.  Patient's description of the pain as being related to her lymph nodes but otherwise unable to characterize the pain makes me suspicious that this may be related to lymphedema after her surgery and radiation treatment.  If CT is negative for any evidence of cancer recurrence, we discussed the use of compression socks and stockings that she had used after her surgery initially.  Per NCCN surveillance recommendations, we will continue with  surveillance visits every 6 months until 5 years after completion of adjuvant treatment.  These will alternative between myself and Dr. Sondra Come.    I recommend paps with high risk HPV screening annually in September.  We will have this done at her visit with Dr. Sondra Come in September.  28 minutes of total time was spent for this patient encounter, including preparation, face-to-face counseling with the patient and coordination of care, and documentation of the encounter.  Jeral Pinch, MD  Division of [facility]  Department of Obstetrics and Gynecology  [facility]

## 2021-11-02 ENCOUNTER — Ambulatory Visit (INDEPENDENT_AMBULATORY_CARE_PROVIDER_SITE_OTHER): Payer: 59

## 2021-11-02 ENCOUNTER — Ambulatory Visit (HOSPITAL_COMMUNITY): Payer: 59

## 2021-11-02 DIAGNOSIS — Z8541 Personal history of malignant neoplasm of cervix uteri: Secondary | ICD-10-CM | POA: Diagnosis not present

## 2021-11-02 DIAGNOSIS — C539 Malignant neoplasm of cervix uteri, unspecified: Secondary | ICD-10-CM

## 2021-11-02 DIAGNOSIS — C55 Malignant neoplasm of uterus, part unspecified: Secondary | ICD-10-CM | POA: Diagnosis not present

## 2021-11-02 DIAGNOSIS — M47816 Spondylosis without myelopathy or radiculopathy, lumbar region: Secondary | ICD-10-CM | POA: Diagnosis not present

## 2021-11-02 DIAGNOSIS — R590 Localized enlarged lymph nodes: Secondary | ICD-10-CM | POA: Diagnosis not present

## 2021-11-02 MED ORDER — IOHEXOL 300 MG/ML  SOLN
100.0000 mL | Freq: Once | INTRAMUSCULAR | Status: AC | PRN
  Administered 2021-11-02: 100 mL via INTRAVENOUS

## 2021-11-05 ENCOUNTER — Telehealth: Payer: Self-pay

## 2021-11-05 NOTE — Telephone Encounter (Signed)
Spoke with patient with assistance of pacific interpreter (ID [phone]). Reviewed results of CT scan, Per Dr. Pricilla Holm - Pelvic CT looks normal. Patient verbalized understanding. She reports an increase in leg swelling and pain with extended periods of driving. Per Dr. Everitt Amber instructions advised patient that compression socks and stockings may help with symptoms. Also advised to take breaks when driving extended periods of time. Patient verbalized understanding. Instructed to call with any needs.

## 2021-11-07 DIAGNOSIS — Z0000 Encounter for general adult medical examination without abnormal findings: Secondary | ICD-10-CM | POA: Diagnosis not present

## 2021-11-07 DIAGNOSIS — Z111 Encounter for screening for respiratory tuberculosis: Secondary | ICD-10-CM | POA: Diagnosis not present

## 2021-11-07 DIAGNOSIS — Z0184 Encounter for antibody response examination: Secondary | ICD-10-CM | POA: Diagnosis not present

## 2022-02-07 ENCOUNTER — Ambulatory Visit: Payer: Self-pay | Admitting: Radiation Oncology

## 2022-08-15 ENCOUNTER — Telehealth: Payer: Self-pay | Admitting: *Deleted

## 2022-08-15 NOTE — Telephone Encounter (Signed)
CALLED PATIENT TO ASK ABOUT SCHEDULING A FU APPT. WITH DR. KINARD, LVM FOR A RETURN CALL

## 2022-08-15 NOTE — Telephone Encounter (Signed)
RETURNED PATIENT'S PHONE CALL, LVM WITH DATE AND TIME FOR THE FU APPT.

## 2022-08-21 NOTE — Progress Notes (Signed)
Radiation Oncology         [phone] ________________________________  Name: Holly Pena MRN: [id number]  Date: 08/22/2022  DOB: Jun 15, 1965  Follow-Up Visit Note  CC: [place], Betty G, MD  Adolphus Birchwood, MD  No diagnosis found.  Diagnosis: Stage III-C (pT1b2, pN1) poorly differentiated squamous cell carcinoma of the cervix    Interval Since Last Radiation: 4 years, 1 month, and 1 day  Radiation treatment dates:  06/11/2018 - 07/21/2018   Site/dose: (post-op radiation therapy) 1. pelvis / 25 fractions x 1.8 Gy for a total of 45 Gy 2. Central pelvis Boost / 3 fractions x 1.8 Gy for a total of 5.4 Gy  Narrative:  The patient returns today for routine follow-up. She was last seen here for follow up on 02/08/21. Since her last visit, the patient followed up with Dr. Pricilla Holm on 10/26/21. During which time, the patient reported noticing some intermittent pain to the groin bilaterally and along the backside of her inner thigh for the past 6-12 months. She described her papin as originating from the "lymph nodes". She otherwise denied any other concerning symptoms and she was noted as NED on examination.   In light of her continued group and inner thigh pain, Dr. Pricilla Holm ordered a pelvic CT on 11/02/21 which showed no evidence of inguinal lymphadenopathy or other suspicious abnormalities in the pelvis.   No other significant interval history since the patient was last seen.   ***                                Allergies:  has No Known Allergies.  Meds: Current Outpatient Medications  Medication Sig Dispense Refill   b complex vitamins capsule Take 1 capsule by mouth daily. (Patient not taking: Reported on 02/08/2021)     cholecalciferol (VITAMIN D3) 25 MCG (1000 UT) tablet Take 1,000 Units by mouth daily. (Patient not taking: Reported on 02/08/2021)     Glucosamine-Chondroitin (MOVE FREE PO) Take 1 tablet by mouth daily.  (Patient not taking: Reported on 02/08/2021)     Probiotic Product  (PROBIOTIC PO) Take by mouth daily.  (Patient not taking: Reported on 02/08/2021)     No current facility-administered medications for this encounter.    Physical Findings: The patient is in no acute distress. Patient is alert and oriented.  vitals were not taken for this visit. .  No significant changes. Lungs are clear to auscultation bilaterally. Heart has regular rate and rhythm. No palpable cervical, supraclavicular, or axillary adenopathy. Abdomen soft, non-tender, normal bowel sounds.  On pelvic examination the external genitalia were unremarkable. A speculum exam was performed. There are no mucosal lesions noted in the vaginal vault. A Pap smear was obtained of the proximal vagina. On bimanual and rectovaginal examination there were no pelvic masses appreciated. ***   Lab Findings: Lab Results  Component Value Date   WBC 3.6 (L) 01/29/2019   HGB 13.1 01/29/2019   HCT 38.5 01/29/2019   MCV 88.1 01/29/2019   PLT 161 01/29/2019    Radiographic Findings: No results found.  Impression:  Stage III-C (pT1b2, pN1) poorly differentiated squamous cell carcinoma of the cervix    The patient is recovering from the effects of radiation.  ***  Plan:  ***   *** minutes of total time was spent for this patient encounter, including preparation, face-to-face counseling with the patient and coordination of care, physical exam, and documentation of the encounter. ____________________________________  Blair Promise, PhD, MD  This document serves as a record of services personally performed by Gery Pray, MD. It was created on his behalf by Roney Mans, a trained medical scribe. The creation of this record is based on the scribe's personal observations and the provider's statements to them. This document has been checked and approved by the attending provider.

## 2022-08-22 ENCOUNTER — Encounter: Payer: Self-pay | Admitting: Radiation Oncology

## 2022-08-22 ENCOUNTER — Other Ambulatory Visit: Payer: Self-pay

## 2022-08-22 ENCOUNTER — Ambulatory Visit
Admission: RE | Admit: 2022-08-22 | Discharge: 2022-08-22 | Disposition: A | Discharge status: Home / Self Care | Payer: 59 | Source: Ambulatory Visit | Attending: Radiation Oncology | Admitting: Radiation Oncology

## 2022-08-22 ENCOUNTER — Other Ambulatory Visit (HOSPITAL_COMMUNITY)
Admission: RE | Admit: 2022-08-22 | Discharge: 2022-08-22 | Disposition: A | Discharge status: Home / Self Care | Payer: 59 | Source: Ambulatory Visit | Attending: Radiation Oncology | Admitting: Radiation Oncology

## 2022-08-22 VITALS — BP 107/54 | HR 69 | Temp 97.7°F | Resp 18 | Ht 63.0 in | Wt 138.0 lb

## 2022-08-22 DIAGNOSIS — Z8541 Personal history of malignant neoplasm of cervix uteri: Secondary | ICD-10-CM | POA: Insufficient documentation

## 2022-08-22 DIAGNOSIS — C539 Malignant neoplasm of cervix uteri, unspecified: Secondary | ICD-10-CM | POA: Insufficient documentation

## 2022-08-22 DIAGNOSIS — Z923 Personal history of irradiation: Secondary | ICD-10-CM | POA: Insufficient documentation

## 2022-08-22 NOTE — Progress Notes (Addendum)
Holly Pena is here today for follow up post radiation to the pelvic.  They completed their radiation on: 07/21/18   Does the patient complain of any of the following:  Pain:No Abdominal bloating: No Diarrhea/Constipation: No Nausea/Vomiting: No Vaginal Discharge: No Blood in Urine or Stool: No Urinary Issues (dysuria/incomplete emptying/ incontinence/ increased frequency/urgency): No Does patient report using vaginal dilator 2-3 times a week and/or sexually active 2-3 weeks: Patient recently started back using vaginal dilator.  Post radiation skin changes: No   Additional comments if applicable:Patient reports feeling lymph nodes in groin area and behind knee.    Patient reports having a pap smear performed recently in [place].   BP (!) 107/54 (BP Location: Left Arm, Patient Position: Sitting, Cuff Size: Normal)   Pulse 69   Temp 97.7 F (36.5 C)   Resp 18   Ht 5\' 3"  (1.6 m)   Wt 138 lb (62.6 kg)   LMP 02/20/2018 Comment: no period in 2 years  SpO2 100%   BMI 24.45 kg/m

## 2022-08-27 ENCOUNTER — Telehealth: Payer: Self-pay

## 2022-08-27 LAB — CYTOLOGY - PAP
Comment: NEGATIVE
Diagnosis: UNDETERMINED — AB
High risk HPV: NEGATIVE

## 2022-08-27 NOTE — Telephone Encounter (Signed)
Called and made patient aware of pap smear results. Patient  voiced understanding.

## 2023-03-03 ENCOUNTER — Telehealth: Payer: Self-pay | Admitting: *Deleted

## 2023-03-03 NOTE — Telephone Encounter (Signed)
Spoke with Holly Pena who called the office to cancel her appt. With Dr. Pricilla Holm on October 24 th. Pt doesn't want to reschedule at this time and states she will call the office back at a later time once she has her schedule.

## 2023-03-06 ENCOUNTER — Ambulatory Visit: Payer: 59 | Admitting: Gynecologic Oncology

## 2023-08-25 ENCOUNTER — Telehealth: Payer: Self-pay | Admitting: Radiation Oncology

## 2023-08-25 NOTE — Telephone Encounter (Signed)
 4/14 @ 2:55 pm Patient left voicemail to cancel her FU30 appt.  Called patient back no answer left voicemail if patient wanted to r/s her appt from 4/17 to next available.  Waiting on call back to confirm.

## 2023-08-28 ENCOUNTER — Ambulatory Visit: Payer: Self-pay | Admitting: Radiation Oncology

## 2023-08-29 ENCOUNTER — Telehealth: Payer: Self-pay | Admitting: Radiation Oncology

## 2023-08-29 NOTE — Telephone Encounter (Signed)
 4/18 @ 3:58 pm Left voicemail for patient to call our office to be r/s for cancel appt on 4/17, if 5/1 @ 8:30 am, works with her sch.
